# Patient Record
Sex: Female | Born: 1965 | Smoking: Current every day smoker
Health system: Southern US, Community
[De-identification: ages and names within clinical notes are randomized; demographics above are authoritative.]

## PROBLEM LIST (undated history)

## (undated) DIAGNOSIS — F329 Major depressive disorder, single episode, unspecified: Secondary | ICD-10-CM

## (undated) DIAGNOSIS — H548 Legal blindness, as defined in USA: Secondary | ICD-10-CM

## (undated) DIAGNOSIS — IMO0002 Reserved for concepts with insufficient information to code with codable children: Secondary | ICD-10-CM

## (undated) DIAGNOSIS — M329 Systemic lupus erythematosus, unspecified: Secondary | ICD-10-CM

## (undated) DIAGNOSIS — I10 Essential (primary) hypertension: Secondary | ICD-10-CM

## (undated) DIAGNOSIS — F419 Anxiety disorder, unspecified: Secondary | ICD-10-CM

## (undated) DIAGNOSIS — F32A Depression, unspecified: Secondary | ICD-10-CM

## (undated) HISTORY — DX: Depression, unspecified: F32.A

## (undated) HISTORY — DX: Major depressive disorder, single episode, unspecified: F32.9

## (undated) HISTORY — DX: Anxiety disorder, unspecified: F41.9

---

## 1994-08-30 HISTORY — PX: ABDOMINAL HYSTERECTOMY: SHX81

## 2014-08-30 HISTORY — PX: EYE SURGERY: SHX253

## 2018-02-06 ENCOUNTER — Emergency Department (HOSPITAL_COMMUNITY): Payer: Medicare Other

## 2018-02-06 ENCOUNTER — Encounter (HOSPITAL_COMMUNITY): Payer: Self-pay | Admitting: Emergency Medicine

## 2018-02-06 ENCOUNTER — Emergency Department (HOSPITAL_COMMUNITY)
Admission: EM | Admit: 2018-02-06 | Discharge: 2018-02-06 | Discharge status: Against Medical Advice | Payer: Medicare Other | Attending: Emergency Medicine | Admitting: Emergency Medicine

## 2018-02-06 DIAGNOSIS — R4701 Aphasia: Secondary | ICD-10-CM | POA: Insufficient documentation

## 2018-02-06 DIAGNOSIS — R51 Headache: Secondary | ICD-10-CM | POA: Insufficient documentation

## 2018-02-06 DIAGNOSIS — I10 Essential (primary) hypertension: Secondary | ICD-10-CM | POA: Insufficient documentation

## 2018-02-06 DIAGNOSIS — Z79899 Other long term (current) drug therapy: Secondary | ICD-10-CM | POA: Insufficient documentation

## 2018-02-06 DIAGNOSIS — R4789 Other speech disturbances: Secondary | ICD-10-CM

## 2018-02-06 DIAGNOSIS — R42 Dizziness and giddiness: Secondary | ICD-10-CM | POA: Insufficient documentation

## 2018-02-06 DIAGNOSIS — R509 Fever, unspecified: Secondary | ICD-10-CM | POA: Insufficient documentation

## 2018-02-06 DIAGNOSIS — R4781 Slurred speech: Secondary | ICD-10-CM | POA: Diagnosis present

## 2018-02-06 HISTORY — DX: Legal blindness, as defined in USA: H54.8

## 2018-02-06 HISTORY — DX: Essential (primary) hypertension: I10

## 2018-02-06 HISTORY — DX: Systemic lupus erythematosus, unspecified: M32.9

## 2018-02-06 HISTORY — DX: Reserved for concepts with insufficient information to code with codable children: IMO0002

## 2018-02-06 LAB — URINALYSIS, ROUTINE W REFLEX MICROSCOPIC
BILIRUBIN URINE: NEGATIVE
Glucose, UA: NEGATIVE mg/dL
HGB URINE DIPSTICK: NEGATIVE
KETONES UR: NEGATIVE mg/dL
Leukocytes, UA: NEGATIVE
Nitrite: NEGATIVE
PROTEIN: NEGATIVE mg/dL
Specific Gravity, Urine: 1.009 (ref 1.005–1.030)
pH: 6 (ref 5.0–8.0)

## 2018-02-06 LAB — CBC WITH DIFFERENTIAL/PLATELET
BASOS ABS: 0.1 10*3/uL (ref 0.0–0.1)
Basophils Relative: 1 %
Eosinophils Absolute: 0.1 10*3/uL (ref 0.0–0.7)
Eosinophils Relative: 2 %
HCT: 40.5 % (ref 36.0–46.0)
Hemoglobin: 12.2 g/dL (ref 12.0–15.0)
LYMPHS ABS: 0.6 10*3/uL — AB (ref 0.7–4.0)
LYMPHS PCT: 10 %
MCH: 21.4 pg — ABNORMAL LOW (ref 26.0–34.0)
MCHC: 30.1 g/dL (ref 30.0–36.0)
MCV: 71.2 fL — ABNORMAL LOW (ref 78.0–100.0)
MONOS PCT: 8 %
Monocytes Absolute: 0.5 10*3/uL (ref 0.1–1.0)
Neutro Abs: 4.4 10*3/uL (ref 1.7–7.7)
Neutrophils Relative %: 79 %
Platelets: 239 10*3/uL (ref 150–400)
RBC: 5.69 MIL/uL — AB (ref 3.87–5.11)
RDW: 22.8 % — ABNORMAL HIGH (ref 11.5–15.5)
WBC: 5.7 10*3/uL (ref 4.0–10.5)

## 2018-02-06 LAB — COMPREHENSIVE METABOLIC PANEL
ALT: 14 U/L (ref 14–54)
ANION GAP: 9 (ref 5–15)
AST: 21 U/L (ref 15–41)
Albumin: 3.8 g/dL (ref 3.5–5.0)
Alkaline Phosphatase: 97 U/L (ref 38–126)
BUN: 5 mg/dL — ABNORMAL LOW (ref 6–20)
CO2: 24 mmol/L (ref 22–32)
CREATININE: 0.8 mg/dL (ref 0.44–1.00)
Calcium: 9.3 mg/dL (ref 8.9–10.3)
Chloride: 103 mmol/L (ref 101–111)
Glucose, Bld: 101 mg/dL — ABNORMAL HIGH (ref 65–99)
POTASSIUM: 4 mmol/L (ref 3.5–5.1)
Sodium: 136 mmol/L (ref 135–145)
Total Bilirubin: 0.5 mg/dL (ref 0.3–1.2)
Total Protein: 8.1 g/dL (ref 6.5–8.1)

## 2018-02-06 LAB — TSH: TSH: 0.26 u[IU]/mL — AB (ref 0.350–4.500)

## 2018-02-06 LAB — I-STAT CG4 LACTIC ACID, ED: LACTIC ACID, VENOUS: 1.33 mmol/L (ref 0.5–1.9)

## 2018-02-06 LAB — T4, FREE: Free T4: 1.15 ng/dL (ref 0.82–1.77)

## 2018-02-06 LAB — PROTIME-INR
INR: 1.05
Prothrombin Time: 13.6 seconds (ref 11.4–15.2)

## 2018-02-06 IMAGING — CT CT HEAD W/O CM
4 series · 17 of 47 positions shown, 19 images · non-contrast
Comparison: None.

CLINICAL DATA: Slurred speech and dizziness. Fever. Fall this
morning.

EXAM:
CT HEAD WITHOUT CONTRAST
TECHNIQUE: Contiguous axial images were obtained from the base of the skull
through the vertex without intravenous contrast.

[Series 3: head wo · axial · 0.43mm/px · z∈[-204,-84]mm · 7 of 34 slices shown, 9 images]
[im 5/34  brain]
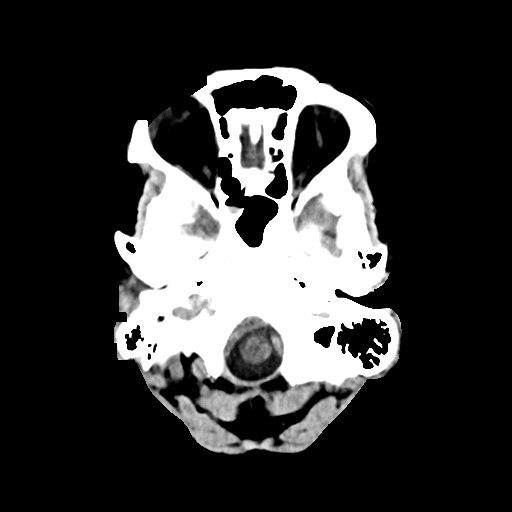
[im 5/34  bone]
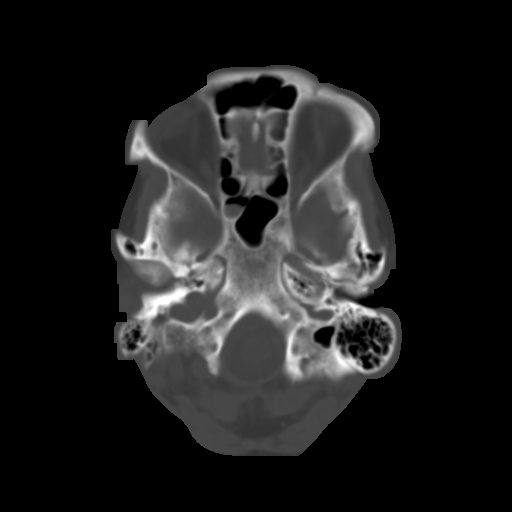
[im 9/34  brain]
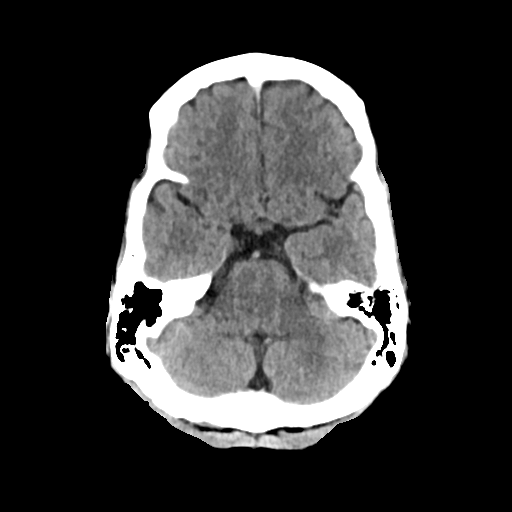
[im 13/34  brain]
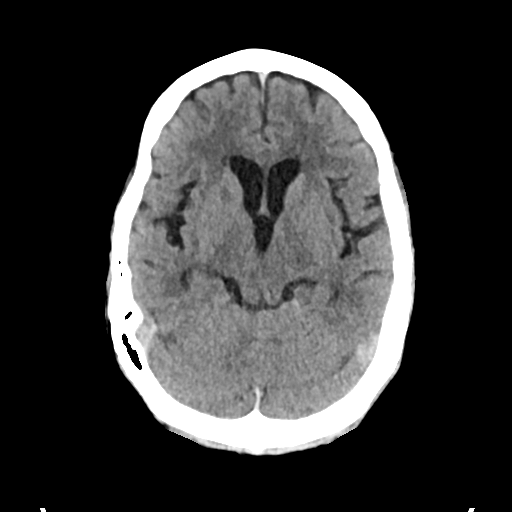
[im 17/34  brain]
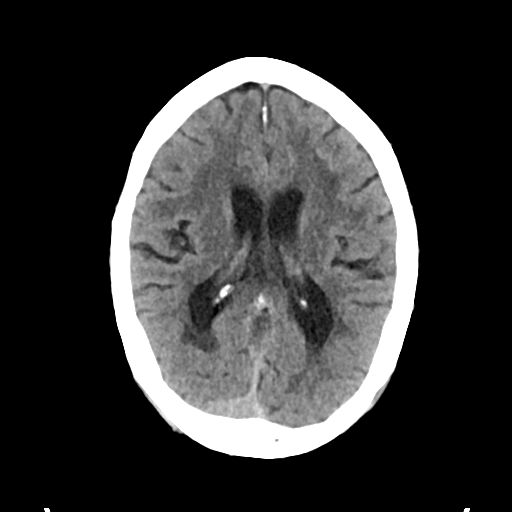
[im 21/34  brain]
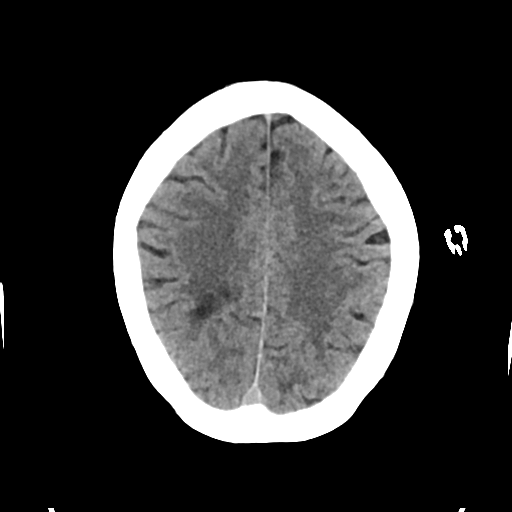
[im 21/34  bone]
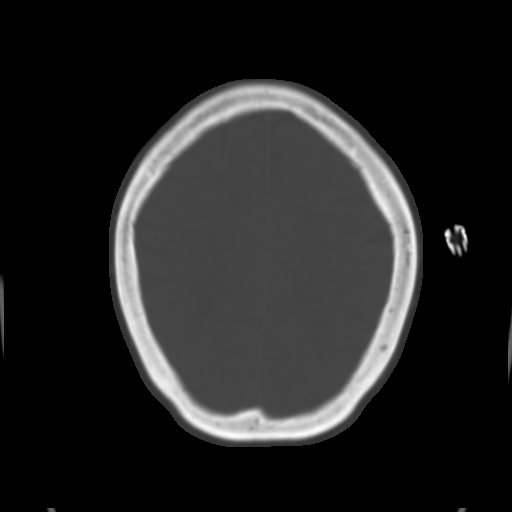
[im 25/34  brain]
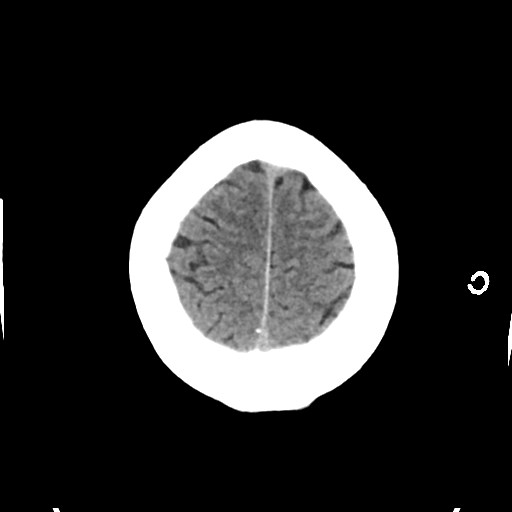
[im 29/34  brain]
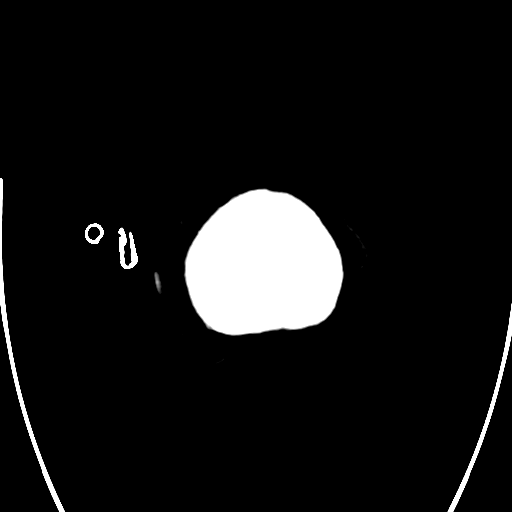

[Series 4: head bone · axial · 0.43mm/px · z∈[-208,-150]mm · 4 of 85 slices shown]
[im 9/85  bone]
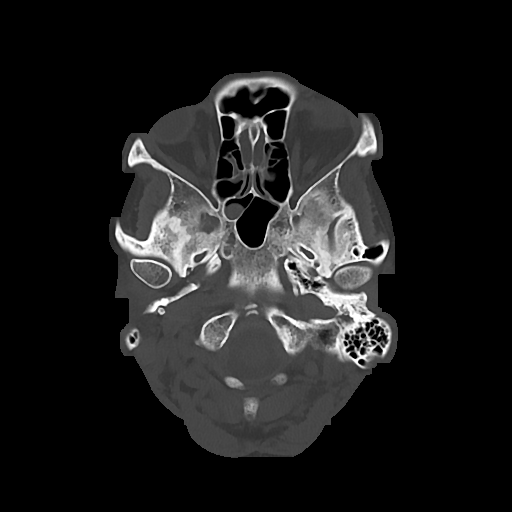
[im 17/85  bone]
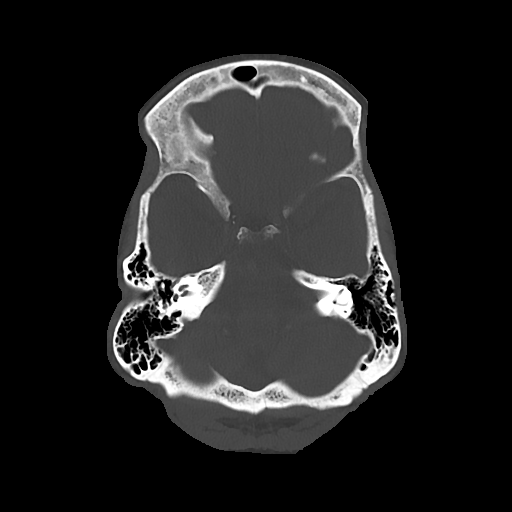
[im 26/85  bone]
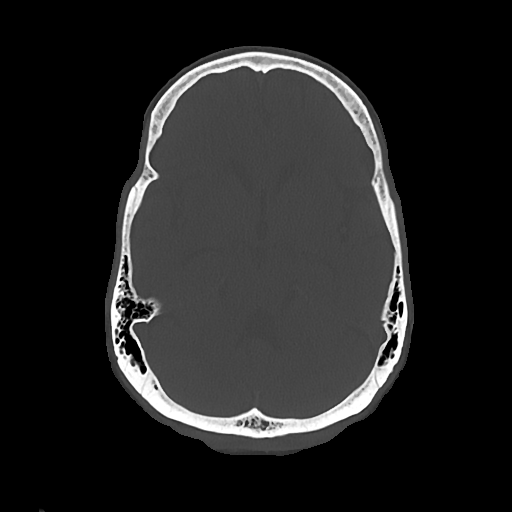
[im 38/85  bone]
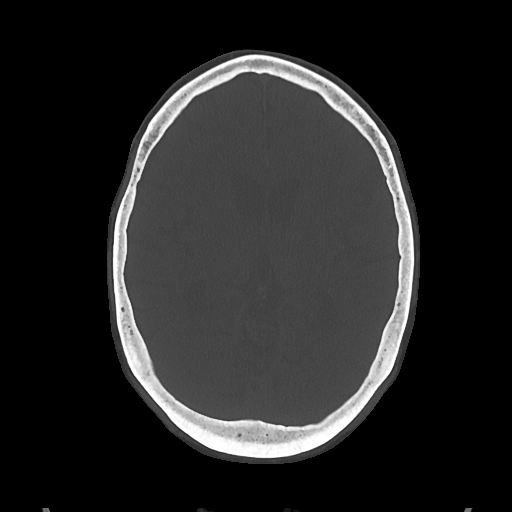

[Series 5: cor soft · coronal · 0.33mm/px · 3 of 71 slices shown]
[im 24/71  brain]
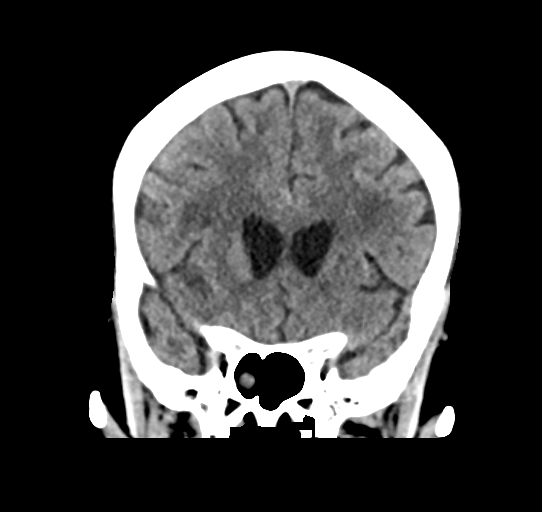
[im 32/71  brain]
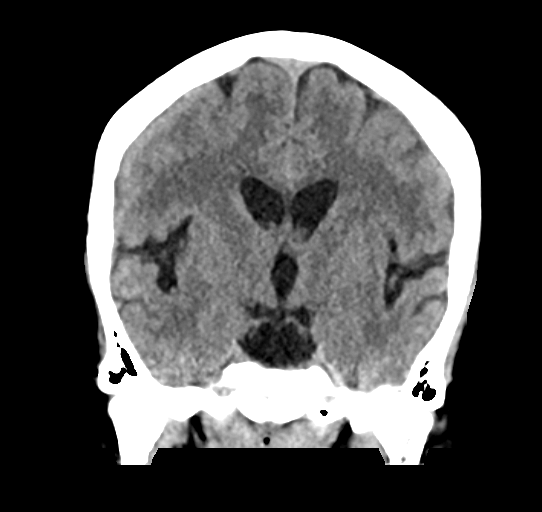
[im 39/71  brain]
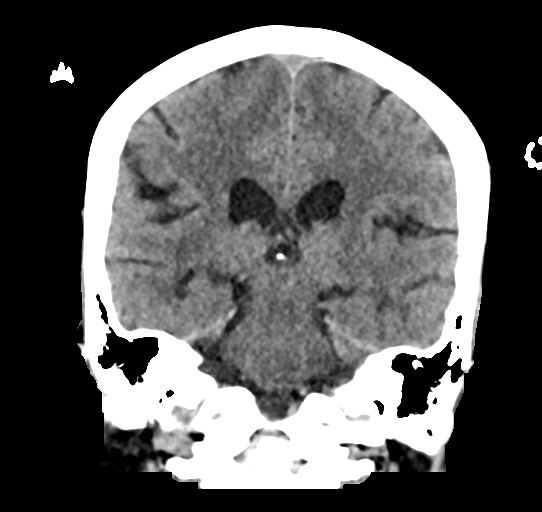

[Series 6: sag soft · sagittal · 0.37mm/px · 3 of 64 slices shown]
[im 22/64  brain]
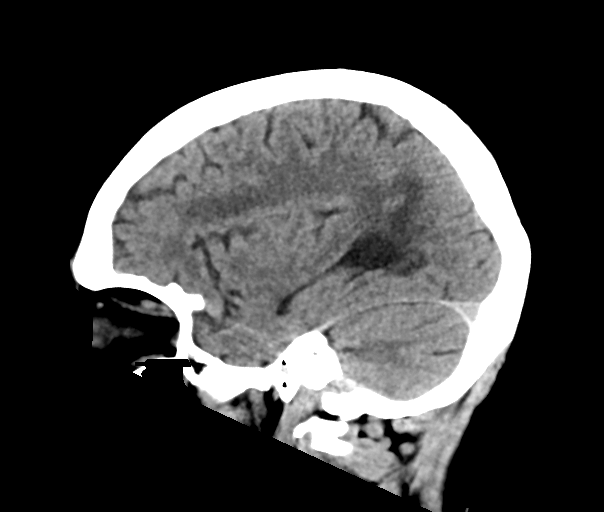
[im 32/64  brain]
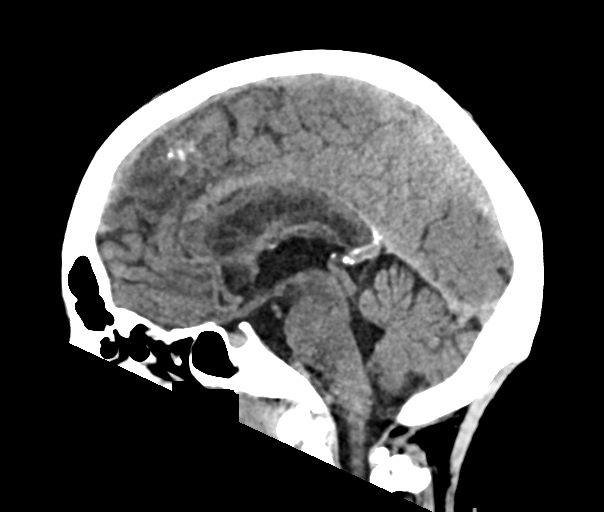
[im 43/64  brain]
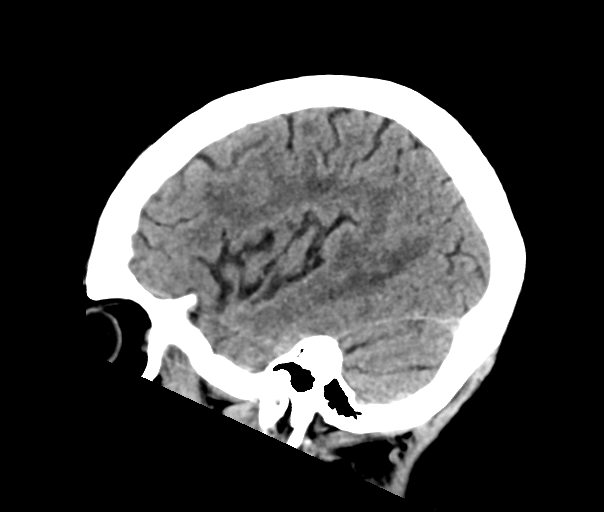

[17 of 47 positions shown; findings below may reference images not displayed]

FINDINGS: Brain: No subdural, epidural, or subarachnoid hemorrhage.
Cerebellum, brainstem, and basal cisterns are normal. Ventricles and
sulci are unremarkable. White matter changes are identified. No
acute cortical ischemia or infarct. No mass effect or midline shift.
No acute cortical ischemia infarct noted.

Vascular: Mild atherosclerotic changes in the intracranial carotids.

Skull: Normal. Negative for fracture or focal lesion.

Sinuses/Orbits: Mucosal thickening in the ethmoid air cells.

Other: None.
IMPRESSION: White matter changes. No acute cortical ischemia or infarct. No
bleed identified.

## 2018-02-06 IMAGING — CR DG CHEST 2V
2 series · 2 of 2 positions shown · non-contrast
Comparison: None

CLINICAL DATA: Fever

EXAM:
CHEST - 2 VIEW

[chest lat]
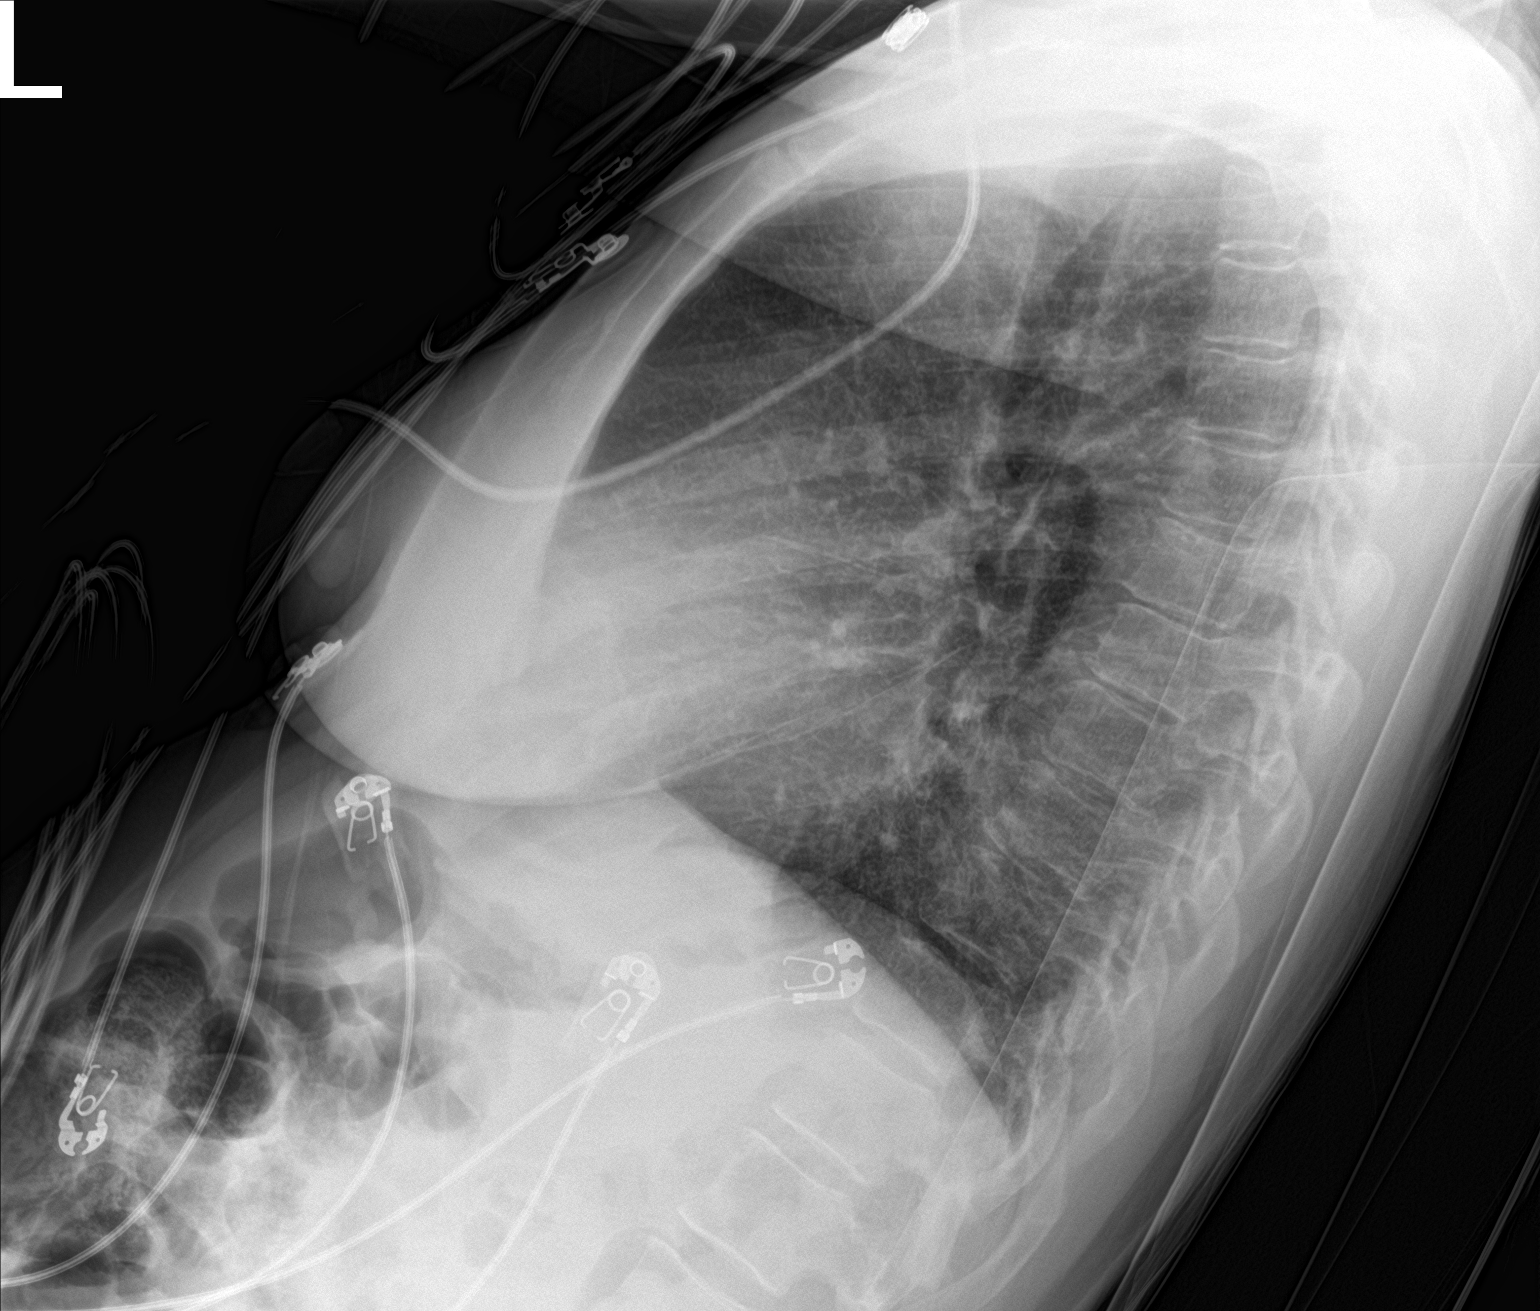

[chest ap]
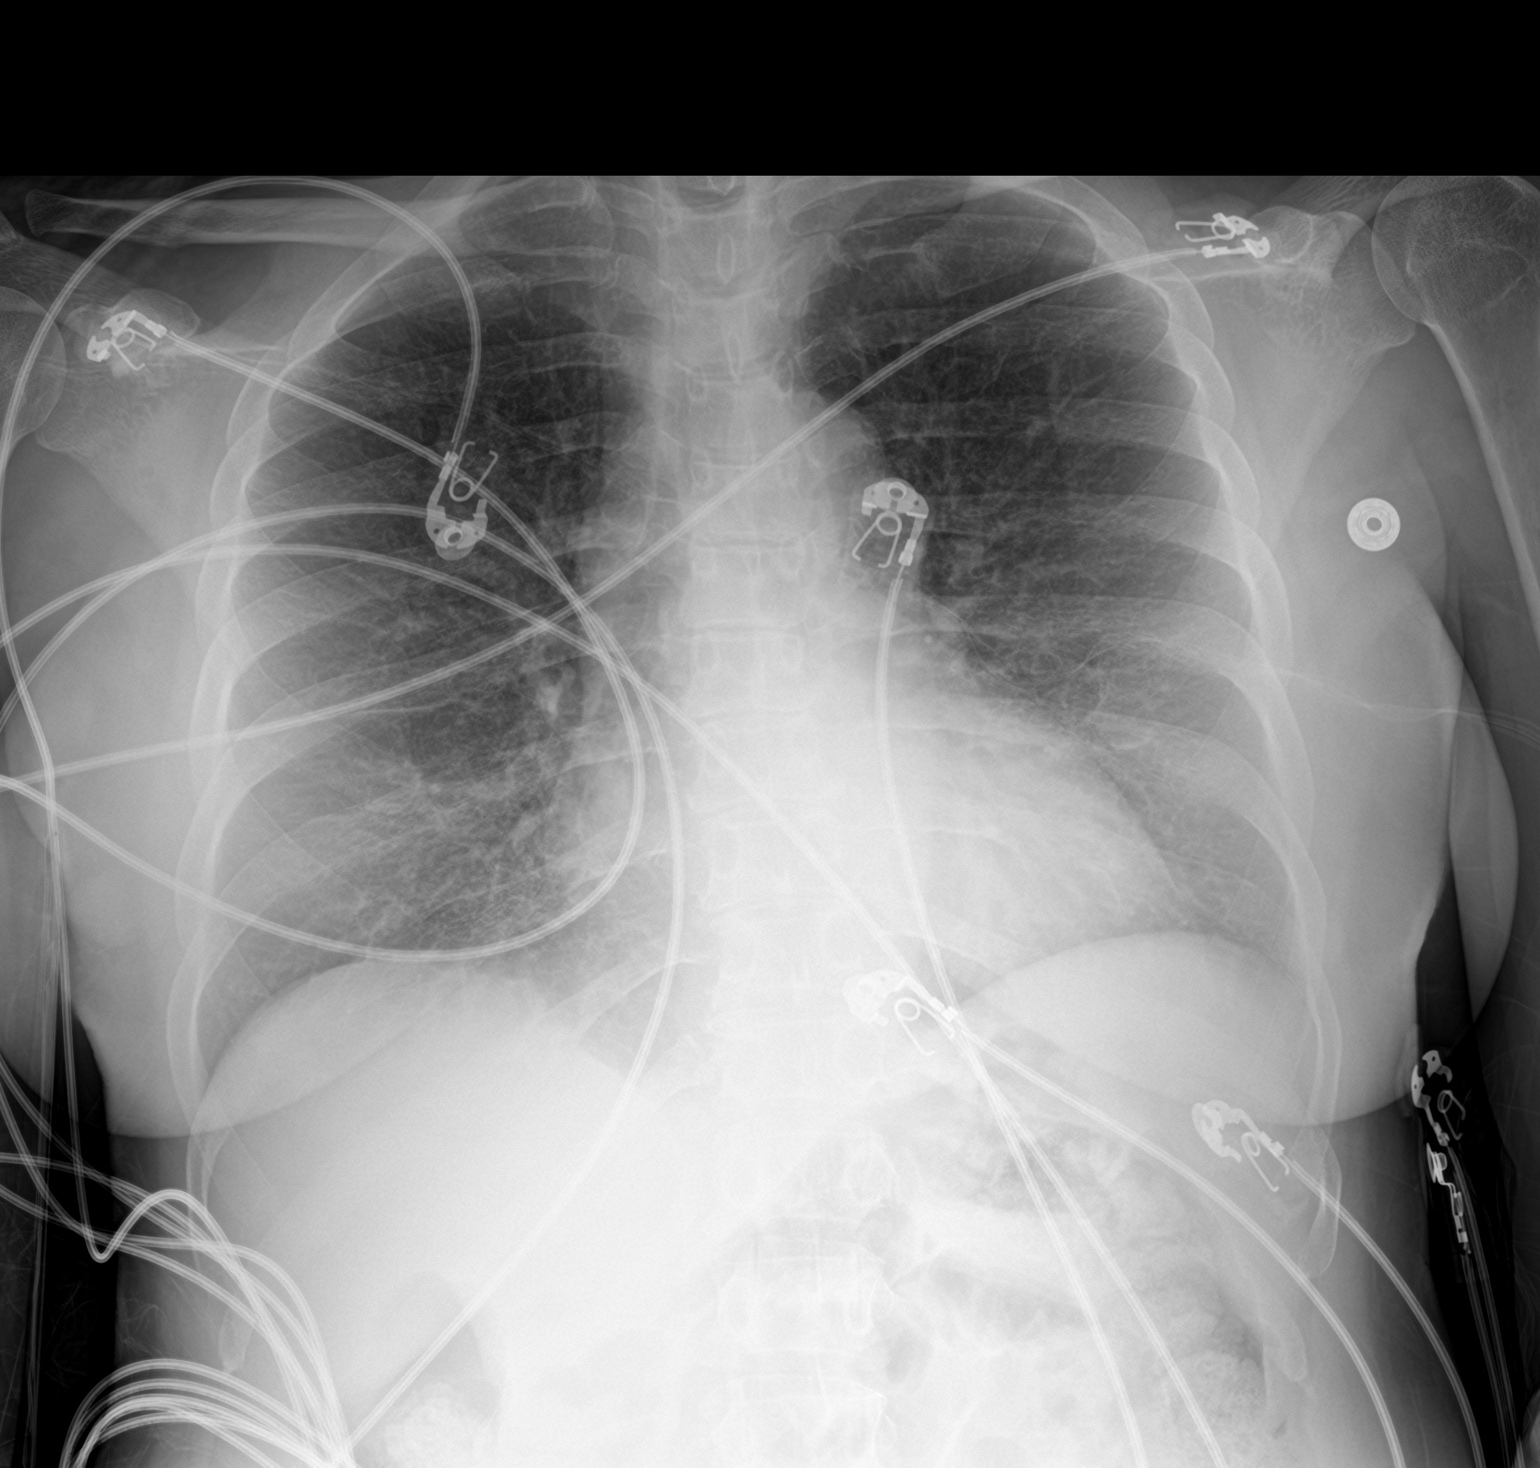

[2 of 2 positions shown; findings below may reference images not displayed]

FINDINGS: No pneumonia or pleural effusion is seen. Mediastinal and hilar
contours are unremarkable. The heart is within upper limits of
normal. No acute bony abnormality is seen.
IMPRESSION: No active cardiopulmonary disease.

## 2018-02-06 MED ORDER — SODIUM CHLORIDE 0.9 % IV BOLUS
1000.0000 mL | Freq: Once | INTRAVENOUS | Status: AC
  Administered 2018-02-06: 1000 mL via INTRAVENOUS

## 2018-02-06 MED ORDER — ACETAMINOPHEN 325 MG PO TABS
650.0000 mg | ORAL_TABLET | Freq: Once | ORAL | Status: AC
  Administered 2018-02-06: 650 mg via ORAL
  Filled 2018-02-06: qty 2

## 2018-02-06 MED ORDER — LEVOFLOXACIN 750 MG PO TABS: 750.0000 mg | ORAL_TABLET | Freq: Every day | ORAL | 0 refills | Status: DC

## 2018-02-06 NOTE — ED Triage Notes (Signed)
PT went to bed at 0400 this morning. PT was normal at that time. PT woke up at 0700 with slurred speech and dizziness. LVO negative per EMS. PT has not personal history of stroke. PT is febrile in triage 102.6. PT reports she fell 2x this AM. PT has been on antibiotics recently for sinus issues. PT still taking these.   PT reports fatigue as well.

## 2018-02-06 NOTE — ED Provider Notes (Signed)
[facility] EMERGENCY DEPARTMENT Provider Note   CSN: [id number] Arrival date & time: 02/06/18  1416     History   Chief Complaint Chief Complaint  Patient presents with  . Aphasia  . Fever    HPI Dana Turner is a 52 y.o. female.  HPI Patient states she is recently started on antibiotics for cold.  Continues to have cough.  States states that she went to bed around 4 AM.  She woke it 7 AM with some slurring of her speech and dizziness.  Describes the dizziness as lightheaded.  Thinks that she fell off the toilet.  Denied nausea or vomiting.  States she does have a frontal headache.  She is legally blind in her left eye with no changes in the vision of her right eye.  No neck pain or stiffness.  She denies chest pain or abdominal pain.  Denies any focal weakness.  No recent medication changes other than the antibiotic. Past Medical History:  Diagnosis Date  . Hypertension   . Legally blind in left eye, as defined in [place]   . Lupus (HCC)     There are no active problems to display for this patient.   Past Surgical History:  Procedure Laterality Date  . ABDOMINAL HYSTERECTOMY    . EYE SURGERY       OB History   None      Home Medications    Prior to Admission medications   Medication Sig Start Date End Date Taking? Authorizing Provider  ALPRAZolam Prudy Feeler) 1 MG tablet Take 1 mg by mouth 3 (three) times daily as needed for anxiety.   Yes [provider]  amLODipine (NORVASC) 10 MG tablet Take 10 mg by mouth daily.   Yes [provider]  hydroxychloroquine (PLAQUENIL) 200 MG tablet Take 200 mg by mouth 2 (two) times daily.   Yes [provider]  levofloxacin (LEVAQUIN) 750 MG tablet Take 1 tablet (750 mg total) by mouth daily. X 7 days 02/06/18   Loren Racer, MD    Family History No family history on file.  Social History Social History   Tobacco Use  . Smoking status: Not on file  Substance Use Topics  .  Alcohol use: Not on file  . Drug use: Not on file     Allergies   Patient has no known allergies.   Review of Systems Review of Systems  Constitutional: Positive for chills and fever.  HENT: Negative for sore throat.   Eyes: Negative for visual disturbance.  Respiratory: Positive for cough. Negative for shortness of breath and wheezing.   Cardiovascular: Negative for chest pain, palpitations and leg swelling.  Gastrointestinal: Negative for abdominal pain, constipation, diarrhea, nausea and vomiting.  Genitourinary: Negative for difficulty urinating, flank pain and frequency.  Musculoskeletal: Negative for back pain, gait problem, myalgias and neck pain.  Skin: Negative for rash and wound.  Neurological: Positive for dizziness, speech difficulty, light-headedness and headaches. Negative for syncope, weakness and numbness.  All other systems reviewed and are negative.    Physical Exam Updated Vital Signs BP (!) 167/115   Pulse 93   Temp (!) 101.2 F (38.4 C) (Oral)   Resp (!) 23   Wt 58.5 kg (129 lb)   SpO2 100%   Physical Exam  Constitutional: She is oriented to person, place, and time. She appears well-developed and well-nourished. No distress.  HENT:  Head: Normocephalic and atraumatic.  Mouth/Throat: Oropharynx is clear and moist. No oropharyngeal exudate.  No sinus tenderness to percussion.  Eyes: Pupils are equal, round, and reactive to light. EOM are normal.  Disconjugate eye movement.  Neck: Normal range of motion. Neck supple. No thyromegaly present.  No meningismus.  Cardiovascular: Regular rhythm. Exam reveals no gallop and no friction rub.  No murmur heard. Tachycardia  Pulmonary/Chest: Effort normal and breath sounds normal. No respiratory distress. She has no wheezes. She has no rales.  Abdominal: Soft. Bowel sounds are normal. She exhibits no distension and no mass. There is no tenderness. There is no rebound and no guarding. No hernia.    Musculoskeletal: Normal range of motion. She exhibits no edema or tenderness.  No midline thoracic lumbar tenderness.  No CVA tenderness.  No lower extremity swelling, asymmetry or tenderness.  Distal pulses are 2+.  Lymphadenopathy:    She has no cervical adenopathy.  Neurological: She is alert and oriented to person, place, and time.  Moves all extremities without focal deficit.  Sensation intact.  No facial asymmetry.  Question mild ataxia with right finger-nose testing though patient is legally blind in the left eye.  Speech is measured and at times slurred  Skin: Skin is warm and dry. Capillary refill takes less than 2 seconds. No rash noted. She is not diaphoretic. No erythema.  Psychiatric: She has a normal mood and affect. Her behavior is normal.  Nursing note and vitals reviewed.    ED Treatments / Results  Labs (all labs ordered are listed, but only abnormal results are displayed) Labs Reviewed  COMPREHENSIVE METABOLIC PANEL - Abnormal; Notable for the following components:      Result Value   Glucose, Bld 101 (*)    BUN 5 (*)    All other components within normal limits  CBC WITH DIFFERENTIAL/PLATELET - Abnormal; Notable for the following components:   RBC 5.69 (*)    MCV 71.2 (*)    MCH 21.4 (*)    RDW 22.8 (*)    Lymphs Abs 0.6 (*)    All other components within normal limits  TSH - Abnormal; Notable for the following components:   TSH 0.260 (*)    All other components within normal limits  CULTURE, BLOOD (ROUTINE X 2)  CULTURE, BLOOD (ROUTINE X 2)  PROTIME-INR  URINALYSIS, ROUTINE W REFLEX MICROSCOPIC  T4, FREE  INFLUENZA PANEL BY PCR (TYPE A & B)  INFLUENZA PANEL BY PCR (TYPE A & B)  I-STAT CG4 LACTIC ACID, ED    EKG None  Radiology Dg Chest 2 View  Result Date: 02/06/2018 CLINICAL DATA:  Fever EXAM: CHEST - 2 VIEW COMPARISON:  None FINDINGS: No pneumonia or pleural effusion is seen. Mediastinal and hilar contours are unremarkable. The heart is within  upper limits of normal. No acute bony abnormality is seen. IMPRESSION: No active cardiopulmonary disease. Electronically Signed   By: Dwyane Dee M.D.   On: 02/06/2018 15:31   Ct Head Wo Contrast  Result Date: 02/06/2018 CLINICAL DATA:  Slurred speech and dizziness. Fever. Fall this morning. EXAM: CT HEAD WITHOUT CONTRAST TECHNIQUE: Contiguous axial images were obtained from the base of the skull through the vertex without intravenous contrast. COMPARISON:  None. FINDINGS: Brain: No subdural, epidural, or subarachnoid hemorrhage. Cerebellum, brainstem, and basal cisterns are normal. Ventricles and sulci are unremarkable. White matter changes are identified. No acute cortical ischemia or infarct. No mass effect or midline shift. No acute cortical ischemia infarct noted. Vascular: Mild atherosclerotic changes in the intracranial carotids. Skull: Normal. Negative for fracture or focal  lesion. Sinuses/Orbits: Mucosal thickening in the ethmoid air cells. Other: None. IMPRESSION: White matter changes. No acute cortical ischemia or infarct. No bleed identified. Electronically Signed   By: Gerome Sam III M.D   On: 02/06/2018 18:35    Procedures Procedures (including critical care time)  Medications Ordered in ED Medications  acetaminophen (TYLENOL) tablet 650 mg (650 mg Oral Given 02/06/18 1541)  sodium chloride 0.9 % bolus 1,000 mL (0 mLs Intravenous Stopped 02/06/18 2046)     Initial Impression / Assessment and Plan / ED Course  I have reviewed the triage vital signs and the nursing notes.  Pertinent labs & imaging results that were available during my care of the patient were reviewed by me and considered in my medical decision making (see chart for details).     Patient states that her speech is at her baseline.  CT head without acute findings.  Normal white blood cell count.  No definite source for patient's fever.  Given history of speech changes, have recommended MRI brain.  Patient stating  that she is wanting to leave and not get the MRI.  She understands the risks associated with leaving prior to complete work-up and will sign out AMA.  Given patient's symptoms are mostly upper respiratory, will switch antibiotics to broader spectrum.  She understands the need to return immediately for any worsening of her symptoms, new neurologic symptoms or for any concerns.  Final Clinical Impressions(s) / ED Diagnoses   Final diagnoses:  Febrile illness  Spell of change in speech    ED Discharge Orders        Ordered    levofloxacin (LEVAQUIN) 750 MG tablet  Daily     02/06/18 2206       Loren Racer, MD 02/06/18 2206

## 2018-02-06 NOTE — ED Notes (Signed)
Patient transported to CT 

## 2018-02-06 NOTE — ED Notes (Signed)
Patient transported to X-ray 

## 2018-02-06 NOTE — ED Notes (Signed)
Pt requesting to leave AMA. MD [place] Notified.

## 2018-02-07 LAB — INFLUENZA PANEL BY PCR (TYPE A & B)
INFLAPCR: NEGATIVE
INFLBPCR: NEGATIVE

## 2018-02-11 LAB — CULTURE, BLOOD (ROUTINE X 2)
CULTURE: NO GROWTH
Culture: NO GROWTH
SPECIAL REQUESTS: ADEQUATE

## 2018-07-03 ENCOUNTER — Ambulatory Visit: Payer: Self-pay | Admitting: Emergency Medicine

## 2018-08-01 ENCOUNTER — Ambulatory Visit: Payer: Self-pay | Admitting: Emergency Medicine

## 2018-09-14 ENCOUNTER — Other Ambulatory Visit: Payer: Self-pay

## 2018-09-14 ENCOUNTER — Ambulatory Visit (INDEPENDENT_AMBULATORY_CARE_PROVIDER_SITE_OTHER): Payer: Medicare HMO | Admitting: Emergency Medicine

## 2018-09-14 ENCOUNTER — Encounter: Payer: Self-pay | Admitting: Emergency Medicine

## 2018-09-14 VITALS — BP 182/99 | HR 65 | Temp 98.1°F | Resp 16 | Ht 61.25 in | Wt 124.2 lb

## 2018-09-14 DIAGNOSIS — G8929 Other chronic pain: Secondary | ICD-10-CM

## 2018-09-14 DIAGNOSIS — I10 Essential (primary) hypertension: Secondary | ICD-10-CM | POA: Diagnosis not present

## 2018-09-14 DIAGNOSIS — M25561 Pain in right knee: Secondary | ICD-10-CM | POA: Diagnosis not present

## 2018-09-14 DIAGNOSIS — F419 Anxiety disorder, unspecified: Secondary | ICD-10-CM

## 2018-09-14 DIAGNOSIS — M329 Systemic lupus erythematosus, unspecified: Secondary | ICD-10-CM

## 2018-09-14 DIAGNOSIS — Z87828 Personal history of other (healed) physical injury and trauma: Secondary | ICD-10-CM

## 2018-09-14 DIAGNOSIS — IMO0002 Reserved for concepts with insufficient information to code with codable children: Secondary | ICD-10-CM

## 2018-09-14 MED ORDER — AMLODIPINE BESYLATE 10 MG PO TABS: 10.0000 mg | ORAL_TABLET | Freq: Every day | ORAL | 3 refills | Status: DC

## 2018-09-14 NOTE — Progress Notes (Addendum)
Dana Turner 53 y.o. BP Readings from Last 3 Encounters:  09/14/18 (!) 182/99  02/06/18 (!) 167/115     Chief Complaint  Patient presents with  . Establish Care    per patient referrals to-Rheumatology for LUPUS and Orthopedic  . Hypertension    per patient have concerns     HISTORY OF PRESENT ILLNESS: This is a 53 y.o. female here to establish care.  Recently moved from [place].  Has the following medical problems: 1.  SLE diagnosed at age 30.  On Plaquenil 2 times a day.  Requesting rheumatology referral. 2.  Hypertension.  On amlodipine 10 mg a day.  Off medication for several weeks.  Asymptomatic. 3.  Chronic anxiety.  Takes Xanax 1 mg 3 times a day.  Has tried BuSpar in the past. 4.  Status post right knee surgery secondary to ACL tear.  Requesting orthopedic referral.  HPI   Prior to Admission medications   Medication Sig Start Date End Date Taking? Authorizing Provider  ALPRAZolam Prudy Feeler) 1 MG tablet Take 1 mg by mouth 3 (three) times daily as needed for anxiety.   Yes [provider]  amLODipine (NORVASC) 10 MG tablet Take 10 mg by mouth daily.   Yes [provider]  hydroxychloroquine (PLAQUENIL) 200 MG tablet Take 200 mg by mouth 2 (two) times daily.   Yes [provider]  levofloxacin (LEVAQUIN) 750 MG tablet Take 1 tablet (750 mg total) by mouth daily. X 7 days Patient not taking: Reported on 09/14/2018 02/06/18   Loren Racer, MD    No Known Allergies  There are no active problems to display for this patient.   Past Medical History:  Diagnosis Date  . Anxiety   . Depression   . Hypertension   . Legally blind in left eye, as defined in [place]   . Lupus [facility])     Past Surgical History:  Procedure Laterality Date  . ABDOMINAL HYSTERECTOMY  1996  . EYE SURGERY Left 2016    Social History   Socioeconomic History  . Marital status: Unknown    Spouse name: Not on file  . Number of children: Not on file  . Years of  education: Not on file  . Highest education level: Not on file  Occupational History  . Not on file  Social Needs  . Financial resource strain: Not on file  . Food insecurity:    Worry: Not on file    Inability: Not on file  . Transportation needs:    Medical: Not on file    Non-medical: Not on file  Tobacco Use  . Smoking status: Current Every Day Smoker    Packs/day: 0.50    Years: 10.00    Pack years: 5.00    Types: Cigarettes  . Smokeless tobacco: Never Used  Substance and Sexual Activity  . Alcohol use: Yes    Alcohol/week: 1.0 standard drinks    Types: 1 Glasses of wine per week  . Drug use: Never  . Sexual activity: Not on file  Lifestyle  . Physical activity:    Days per week: Not on file    Minutes per session: Not on file  . Stress: Not on file  Relationships  . Social connections:    Talks on phone: Not on file    Gets together: Not on file    Attends religious service: Not on file    Active member of club or organization: Not on file    Attends  meetings of clubs or organizations: Not on file    Relationship status: Not on file  . Intimate partner violence:    Fear of current or ex partner: Not on file    Emotionally abused: Not on file    Physically abused: Not on file    Forced sexual activity: Not on file  Other Topics Concern  . Not on file  Social History Narrative  . Not on file    No family history on file.   Review of Systems  Constitutional: Negative.  Negative for chills and fever.  HENT: Negative.   Eyes: Negative.        Blind from left eye  Respiratory: Negative.  Negative for cough and shortness of breath.   Cardiovascular: Negative.  Negative for chest pain and palpitations.  Gastrointestinal: Negative.  Negative for abdominal pain, blood in stool, diarrhea, melena, nausea and vomiting.  Genitourinary: Negative.  Negative for dysuria.  Musculoskeletal: Positive for joint pain and myalgias.  Skin: Negative.  Negative for rash.    Neurological: Negative.  Negative for dizziness and headaches.  Endo/Heme/Allergies: Negative.   All other systems reviewed and are negative.   Vitals:   09/14/18 1434  BP: (!) 182/99  Pulse: 65  Resp: 16  Temp: 98.1 F (36.7 C)  SpO2: 100%    Physical Exam Vitals signs reviewed.  Constitutional:      Appearance: Normal appearance.  HENT:     Head: Normocephalic and atraumatic.     Nose: Nose normal.     Mouth/Throat:     Mouth: Mucous membranes are moist.     Pharynx: Oropharynx is clear.  Eyes:     Conjunctiva/sclera: Conjunctivae normal.     Comments: Normal right eye  Neck:     Musculoskeletal: Normal range of motion and neck supple.  Cardiovascular:     Rate and Rhythm: Normal rate and regular rhythm.     Heart sounds: Murmur present. Systolic murmur present with a grade of 3/6.  Pulmonary:     Breath sounds: Normal breath sounds.  Abdominal:     General: There is no distension.     Palpations: Abdomen is soft.     Tenderness: There is no abdominal tenderness.  Musculoskeletal: Normal range of motion.  Skin:    General: Skin is warm and dry.  Neurological:     General: No focal deficit present.     Mental Status: She is oriented to person, place, and time.  Psychiatric:        Mood and Affect: Mood normal.        Behavior: Behavior normal.      ASSESSMENT & PLAN: Dorsa was seen today for establish care and hypertension.  Diagnoses and all orders for this visit:  Essential hypertension -     amLODipine (NORVASC) 10 MG tablet; Take 1 tablet (10 mg total) by mouth daily.  History of lupus (HCC) -     Ambulatory referral to Rheumatology  Chronic pain of right knee -     Ambulatory referral to Orthopedic Surgery  History of tear of ACL (anterior cruciate ligament) -     Ambulatory referral to Orthopedic Surgery  Chronic anxiety    Patient Instructions       If you have lab work done today you will be contacted with your lab results  within the next 2 weeks.  If you have not heard from [place] then please contact us. The fastest way to get your results is  to register for My Chart.   IF you received an x-ray today, you will receive an invoice from [facility]. Please contact [facility] at [phone] with questions or concerns regarding your invoice.   IF you received labwork today, you will receive an invoice from [facility]. Please contact [facility] at [phone] with questions or concerns regarding your invoice.   Our billing staff will not be able to assist you with questions regarding bills from these companies.  You will be contacted with the lab results as soon as Hypertension Hypertension, commonly called high blood pressure, is when the force of blood pumping through the arteries is too strong. The arteries are the blood vessels that carry blood from the heart throughout the body. Hypertension forces the heart to work harder to pump blood and may cause arteries to become narrow or stiff. Having untreated or uncontrolled hypertension can cause heart attacks, strokes, kidney disease, and other problems. A blood pressure reading consists of a higher number over a lower number. Ideally, your blood pressure should be below 120/80. The first ("top") number is called the systolic pressure. It is a measure of the pressure in your arteries as your heart beats. The second ("bottom") number is called the diastolic pressure. It is a measure of the pressure in your arteries as the heart relaxes. What are the causes? The cause of this condition is not known. What increases the risk? Some risk factors for high blood pressure are under your control. Others are not. Factors you can change  Smoking.  Having type 2 diabetes mellitus, high cholesterol, or both.  Not getting enough exercise or physical activity.  Being overweight.  Having too much fat, sugar, calories, or salt (sodium) in your diet.  Drinking too  much alcohol. Factors that are difficult or impossible to change  Having chronic kidney disease.  Having a family history of high blood pressure.  Age. Risk increases with age.  Race. You may be at higher risk if you are African-American.  Gender. Men are at higher risk than women before age 52. After age 52, women are at higher risk than men.  Having obstructive sleep apnea.  Stress. What are the signs or symptoms? Extremely high blood pressure (hypertensive crisis) may cause:  Headache.  Anxiety.  Shortness of breath.  Nosebleed.  Nausea and vomiting.  Severe chest pain.  Jerky movements you cannot control (seizures). How is this diagnosed? This condition is diagnosed by measuring your blood pressure while you are seated, with your arm resting on a surface. The cuff of the blood pressure monitor will be placed directly against the skin of your upper arm at the level of your heart. It should be measured at least twice using the same arm. Certain conditions can cause a difference in blood pressure between your right and left arms. Certain factors can cause blood pressure readings to be lower or higher than normal (elevated) for a short period of time:  When your blood pressure is higher when you are in a health care provider's office than when you are at home, this is called white coat hypertension. Most people with this condition do not need medicines.  When your blood pressure is higher at home than when you are in a health care provider's office, this is called masked hypertension. Most people with this condition may need medicines to control blood pressure. If you have a high blood pressure reading during one visit or you have normal blood pressure with other risk factors:  You may be asked to return on a different day to have your blood pressure checked again.  You may be asked to monitor your blood pressure at home for 1 week or longer. If you are diagnosed with  hypertension, you may have other blood or imaging tests to help your health care provider understand your overall risk for other conditions. How is this treated? This condition is treated by making healthy lifestyle changes, such as eating healthy foods, exercising more, and reducing your alcohol intake. Your health care provider may prescribe medicine if lifestyle changes are not enough to get your blood pressure under control, and if:  Your systolic blood pressure is above 130.  Your diastolic blood pressure is above 80. Your personal target blood pressure may vary depending on your medical conditions, your age, and other factors. Follow these instructions at home: Eating and drinking   Eat a diet that is high in fiber and potassium, and low in sodium, added sugar, and fat. An example eating plan is called the DASH (Dietary Approaches to Stop Hypertension) diet. To eat this way: ? Eat plenty of fresh fruits and vegetables. Try to fill half of your plate at each meal with fruits and vegetables. ? Eat whole grains, such as whole wheat pasta, brown rice, or whole grain bread. Fill about one quarter of your plate with whole grains. ? Eat or drink low-fat dairy products, such as skim milk or low-fat yogurt. ? Avoid fatty cuts of meat, processed or cured meats, and poultry with skin. Fill about one quarter of your plate with lean proteins, such as fish, chicken without skin, beans, eggs, and tofu. ? Avoid premade and processed foods. These tend to be higher in sodium, added sugar, and fat.  Reduce your daily sodium intake. Most people with hypertension should eat less than 1,500 mg of sodium a day.  Limit alcohol intake to no more than 1 drink a day for nonpregnant women and 2 drinks a day for men. One drink equals 12 oz of beer, 5 oz of wine, or 1 oz of hard liquor. Lifestyle   Work with your health care provider to maintain a healthy body weight or to lose weight. Ask what an ideal weight is  for you.  Get at least 30 minutes of exercise that causes your heart to beat faster (aerobic exercise) most days of the week. Activities may include walking, swimming, or biking.  Include exercise to strengthen your muscles (resistance exercise), such as pilates or lifting weights, as part of your weekly exercise routine. Try to do these types of exercises for 30 minutes at least 3 days a week.  Do not use any products that contain nicotine or tobacco, such as cigarettes and e-cigarettes. If you need help quitting, ask your health care provider.  Monitor your blood pressure at home as told by your health care provider.  Keep all follow-up visits as told by your health care provider. This is important. Medicines  Take over-the-counter and prescription medicines only as told by your health care provider. Follow directions carefully. Blood pressure medicines must be taken as prescribed.  Do not skip doses of blood pressure medicine. Doing this puts you at risk for problems and can make the medicine less effective.  Ask your health care provider about side effects or reactions to medicines that you should watch for. Contact a health care provider if:  You think you are having a reaction to a medicine you are taking.  You have  headaches that keep coming back (recurring).  You feel dizzy.  You have swelling in your ankles.  You have trouble with your vision. Get help right away if:  You develop a severe headache or confusion.  You have unusual weakness or numbness.  You feel faint.  You have severe pain in your chest or abdomen.  You vomit repeatedly.  You have trouble breathing. Summary  Hypertension is when the force of blood pumping through your arteries is too strong. If this condition is not controlled, it may put you at risk for serious complications.  Your personal target blood pressure may vary depending on your medical conditions, your age, and other factors. For most  people, a normal blood pressure is less than 120/80.  Hypertension is treated with lifestyle changes, medicines, or a combination of both. Lifestyle changes include weight loss, eating a healthy, low-sodium diet, exercising more, and limiting alcohol. This information is not intended to replace advice given to you by your health care provider. Make sure you discuss any questions you have with your health care provider. Document Released: 08/16/2005 Document Revised: 07/14/2016 Document Reviewed: 07/14/2016 Elsevier Interactive Patient Education  [employer].  they are available. The fastest way to get your results is to activate your My Chart account. Instructions are located on the last page of this paperwork. If you have not heard from [place] regarding the results in 2 weeks, please contact this office.         Edwina Barth, MD [facility]

## 2018-09-14 NOTE — Patient Instructions (Addendum)
If you have lab work done today you will be contacted with your lab results within the next 2 weeks.  If you have not heard from us then please contact us. The fastest way to get your results is to register for My Chart.   IF you received an x-ray today, you will receive an invoice from [facility]. Please contact [facility] at [phone] with questions or concerns regarding your invoice.   IF you received labwork today, you will receive an invoice from [facility]. Please contact [facility] at [phone] with questions or concerns regarding your invoice.   Our billing staff will not be able to assist you with questions regarding bills from these companies.  You will be contacted with the lab results as soon as Hypertension Hypertension, commonly called high blood pressure, is when the force of blood pumping through the arteries is too strong. The arteries are the blood vessels that carry blood from the heart throughout the body. Hypertension forces the heart to work harder to pump blood and may cause arteries to become narrow or stiff. Having untreated or uncontrolled hypertension can cause heart attacks, strokes, kidney disease, and other problems. A blood pressure reading consists of a higher number over a lower number. Ideally, your blood pressure should be below 120/80. The first ("top") number is called the systolic pressure. It is a measure of the pressure in your arteries as your heart beats. The second ("bottom") number is called the diastolic pressure. It is a measure of the pressure in your arteries as the heart relaxes. What are the causes? The cause of this condition is not known. What increases the risk? Some risk factors for high blood pressure are under your control. Others are not. Factors you can change  Smoking.  Having type 2 diabetes mellitus, high cholesterol, or both.  Not getting enough exercise or physical activity.  Being  overweight.  Having too much fat, sugar, calories, or salt (sodium) in your diet.  Drinking too much alcohol. Factors that are difficult or impossible to change  Having chronic kidney disease.  Having a family history of high blood pressure.  Age. Risk increases with age.  Race. You may be at higher risk if you are African-American.  Gender. Men are at higher risk than women before age 52. After age 52, women are at higher risk than men.  Having obstructive sleep apnea.  Stress. What are the signs or symptoms? Extremely high blood pressure (hypertensive crisis) may cause:  Headache.  Anxiety.  Shortness of breath.  Nosebleed.  Nausea and vomiting.  Severe chest pain.  Jerky movements you cannot control (seizures). How is this diagnosed? This condition is diagnosed by measuring your blood pressure while you are seated, with your arm resting on a surface. The cuff of the blood pressure monitor will be placed directly against the skin of your upper arm at the level of your heart. It should be measured at least twice using the same arm. Certain conditions can cause a difference in blood pressure between your right and left arms. Certain factors can cause blood pressure readings to be lower or higher than normal (elevated) for a short period of time:  When your blood pressure is higher when you are in a health care provider's office than when you are at home, this is called white coat hypertension. Most people with this condition do not need medicines.  When your blood pressure is higher at home than when you are in  a health care provider's office, this is called masked hypertension. Most people with this condition may need medicines to control blood pressure. If you have a high blood pressure reading during one visit or you have normal blood pressure with other risk factors:  You may be asked to return on a different day to have your blood pressure checked again.  You may  be asked to monitor your blood pressure at home for 1 week or longer. If you are diagnosed with hypertension, you may have other blood or imaging tests to help your health care provider understand your overall risk for other conditions. How is this treated? This condition is treated by making healthy lifestyle changes, such as eating healthy foods, exercising more, and reducing your alcohol intake. Your health care provider may prescribe medicine if lifestyle changes are not enough to get your blood pressure under control, and if:  Your systolic blood pressure is above 130.  Your diastolic blood pressure is above 80. Your personal target blood pressure may vary depending on your medical conditions, your age, and other factors. Follow these instructions at home: Eating and drinking   Eat a diet that is high in fiber and potassium, and low in sodium, added sugar, and fat. An example eating plan is called the DASH (Dietary Approaches to Stop Hypertension) diet. To eat this way: ? Eat plenty of fresh fruits and vegetables. Try to fill half of your plate at each meal with fruits and vegetables. ? Eat whole grains, such as whole wheat pasta, brown rice, or whole grain bread. Fill about one quarter of your plate with whole grains. ? Eat or drink low-fat dairy products, such as skim milk or low-fat yogurt. ? Avoid fatty cuts of meat, processed or cured meats, and poultry with skin. Fill about one quarter of your plate with lean proteins, such as fish, chicken without skin, beans, eggs, and tofu. ? Avoid premade and processed foods. These tend to be higher in sodium, added sugar, and fat.  Reduce your daily sodium intake. Most people with hypertension should eat less than 1,500 mg of sodium a day.  Limit alcohol intake to no more than 1 drink a day for nonpregnant women and 2 drinks a day for men. One drink equals 12 oz of beer, 5 oz of wine, or 1 oz of hard liquor. Lifestyle   Work with your  health care provider to maintain a healthy body weight or to lose weight. Ask what an ideal weight is for you.  Get at least 30 minutes of exercise that causes your heart to beat faster (aerobic exercise) most days of the week. Activities may include walking, swimming, or biking.  Include exercise to strengthen your muscles (resistance exercise), such as pilates or lifting weights, as part of your weekly exercise routine. Try to do these types of exercises for 30 minutes at least 3 days a week.  Do not use any products that contain nicotine or tobacco, such as cigarettes and e-cigarettes. If you need help quitting, ask your health care provider.  Monitor your blood pressure at home as told by your health care provider.  Keep all follow-up visits as told by your health care provider. This is important. Medicines  Take over-the-counter and prescription medicines only as told by your health care provider. Follow directions carefully. Blood pressure medicines must be taken as prescribed.  Do not skip doses of blood pressure medicine. Doing this puts you at risk for problems and can make the  medicine less effective.  Ask your health care provider about side effects or reactions to medicines that you should watch for. Contact a health care provider if:  You think you are having a reaction to a medicine you are taking.  You have headaches that keep coming back (recurring).  You feel dizzy.  You have swelling in your ankles.  You have trouble with your vision. Get help right away if:  You develop a severe headache or confusion.  You have unusual weakness or numbness.  You feel faint.  You have severe pain in your chest or abdomen.  You vomit repeatedly.  You have trouble breathing. Summary  Hypertension is when the force of blood pumping through your arteries is too strong. If this condition is not controlled, it may put you at risk for serious complications.  Your personal  target blood pressure may vary depending on your medical conditions, your age, and other factors. For most people, a normal blood pressure is less than 120/80.  Hypertension is treated with lifestyle changes, medicines, or a combination of both. Lifestyle changes include weight loss, eating a healthy, low-sodium diet, exercising more, and limiting alcohol. This information is not intended to replace advice given to you by your health care provider. Make sure you discuss any questions you have with your health care provider. Document Released: 08/16/2005 Document Revised: 07/14/2016 Document Reviewed: 07/14/2016 Elsevier Interactive Patient Education  [employer].  they are available. The fastest way to get your results is to activate your My Chart account. Instructions are located on the last page of this paperwork. If you have not heard from [place] regarding the results in 2 weeks, please contact this office.

## 2018-10-02 ENCOUNTER — Ambulatory Visit (INDEPENDENT_AMBULATORY_CARE_PROVIDER_SITE_OTHER): Payer: Medicare HMO | Admitting: Orthopedic Surgery

## 2018-10-03 DIAGNOSIS — M2550 Pain in unspecified joint: Secondary | ICD-10-CM | POA: Diagnosis not present

## 2018-10-03 DIAGNOSIS — D8989 Other specified disorders involving the immune mechanism, not elsewhere classified: Secondary | ICD-10-CM | POA: Diagnosis not present

## 2018-10-03 DIAGNOSIS — M797 Fibromyalgia: Secondary | ICD-10-CM | POA: Diagnosis not present

## 2018-10-03 DIAGNOSIS — F419 Anxiety disorder, unspecified: Secondary | ICD-10-CM | POA: Diagnosis not present

## 2018-10-03 DIAGNOSIS — M329 Systemic lupus erythematosus, unspecified: Secondary | ICD-10-CM | POA: Diagnosis not present

## 2018-10-03 DIAGNOSIS — I10 Essential (primary) hypertension: Secondary | ICD-10-CM | POA: Diagnosis not present

## 2018-10-03 DIAGNOSIS — R5383 Other fatigue: Secondary | ICD-10-CM | POA: Diagnosis not present

## 2018-10-11 ENCOUNTER — Ambulatory Visit (INDEPENDENT_AMBULATORY_CARE_PROVIDER_SITE_OTHER): Payer: Medicare HMO | Admitting: Orthopedic Surgery

## 2020-05-14 ENCOUNTER — Telehealth: Payer: Self-pay | Admitting: *Deleted

## 2020-05-14 NOTE — Telephone Encounter (Signed)
Schedule AWV.  

## 2020-11-10 DIAGNOSIS — Z79899 Other long term (current) drug therapy: Secondary | ICD-10-CM | POA: Diagnosis not present

## 2020-11-10 DIAGNOSIS — F17210 Nicotine dependence, cigarettes, uncomplicated: Secondary | ICD-10-CM | POA: Diagnosis not present

## 2020-11-10 DIAGNOSIS — F1290 Cannabis use, unspecified, uncomplicated: Secondary | ICD-10-CM | POA: Diagnosis not present

## 2020-11-10 DIAGNOSIS — M329 Systemic lupus erythematosus, unspecified: Secondary | ICD-10-CM | POA: Diagnosis not present

## 2020-11-10 DIAGNOSIS — F419 Anxiety disorder, unspecified: Secondary | ICD-10-CM | POA: Diagnosis not present

## 2020-11-10 DIAGNOSIS — R7303 Prediabetes: Secondary | ICD-10-CM | POA: Diagnosis not present

## 2020-11-10 DIAGNOSIS — I10 Essential (primary) hypertension: Secondary | ICD-10-CM | POA: Diagnosis not present

## 2020-11-10 DIAGNOSIS — E559 Vitamin D deficiency, unspecified: Secondary | ICD-10-CM | POA: Diagnosis not present

## 2020-11-10 DIAGNOSIS — D649 Anemia, unspecified: Secondary | ICD-10-CM | POA: Diagnosis not present

## 2020-11-18 DIAGNOSIS — F419 Anxiety disorder, unspecified: Secondary | ICD-10-CM | POA: Diagnosis not present

## 2020-11-18 DIAGNOSIS — M329 Systemic lupus erythematosus, unspecified: Secondary | ICD-10-CM | POA: Diagnosis not present

## 2020-11-18 DIAGNOSIS — I10 Essential (primary) hypertension: Secondary | ICD-10-CM | POA: Diagnosis not present

## 2020-11-18 DIAGNOSIS — M2550 Pain in unspecified joint: Secondary | ICD-10-CM | POA: Diagnosis not present

## 2020-11-18 DIAGNOSIS — R5383 Other fatigue: Secondary | ICD-10-CM | POA: Diagnosis not present

## 2020-11-18 DIAGNOSIS — M797 Fibromyalgia: Secondary | ICD-10-CM | POA: Diagnosis not present

## 2020-12-26 ENCOUNTER — Ambulatory Visit (INDEPENDENT_AMBULATORY_CARE_PROVIDER_SITE_OTHER): Payer: Medicare HMO | Admitting: Surgical

## 2020-12-26 ENCOUNTER — Ambulatory Visit (INDEPENDENT_AMBULATORY_CARE_PROVIDER_SITE_OTHER): Payer: Medicare HMO

## 2020-12-26 DIAGNOSIS — M25561 Pain in right knee: Secondary | ICD-10-CM | POA: Diagnosis not present

## 2020-12-26 DIAGNOSIS — G8929 Other chronic pain: Secondary | ICD-10-CM

## 2020-12-26 DIAGNOSIS — M5441 Lumbago with sciatica, right side: Secondary | ICD-10-CM | POA: Diagnosis not present

## 2020-12-29 ENCOUNTER — Telehealth: Payer: Self-pay | Admitting: Surgical

## 2020-12-29 ENCOUNTER — Other Ambulatory Visit: Payer: Self-pay | Admitting: Surgical

## 2020-12-29 MED ORDER — TRAMADOL HCL 50 MG PO TABS: 50.0000 mg | ORAL_TABLET | Freq: Two times a day (BID) | ORAL | 0 refills | Status: DC | PRN

## 2020-12-29 NOTE — Telephone Encounter (Signed)
IC advised.  

## 2020-12-29 NOTE — Telephone Encounter (Signed)
Sent in

## 2020-12-29 NOTE — Telephone Encounter (Signed)
Pls advise.  

## 2020-12-29 NOTE — Telephone Encounter (Signed)
Patient called advised the Rx for (Tramadol) is not at the pharmacy yet. Patent uses the [facility] on [address]. The number to contact patient is [phone]

## 2020-12-30 ENCOUNTER — Encounter: Payer: Self-pay | Admitting: Surgical

## 2020-12-30 NOTE — Progress Notes (Signed)
Office Visit Note   Patient: Dana Turner           Date of Birth: 04-Apr-1966           MRN: [id number] Visit Date: 12/26/2020 Requested by: Georgina Quint, MD [address] [place],  [place] [address] PCP: Georgina Quint, MD  Subjective: Chief Complaint  Patient presents with  . Right Knee - Pain    HPI: Dana Turner is a 55 y.o. female who presents to the office complaining of right knee pain.  Patient complains of right knee pain that has been ongoing over the last year but worse in the last couple months.  She complains of anterior and medial right knee pain.  This pain wakes her up at night and bothers her more with changes in the weather.  Denies any groin pain.  She does have history of several falls last year with recurrent instability of the right knee.  She has history of right knee ACL reconstruction about 4 years ago when she lived in [place].  She describes a meniscus surgery but cannot recall exactly what was done.  She notes that the right knee pain bothers her about as much as the instability of the right knee.  She also endorses low back pain with radicular pain from the anterior thigh to the anterior shin as well as numbness and tingling that is only really present in the anterior knee.  She has been taking Tylenol without any relief.  No history of knee surgery on the other knee.  No history of CAD, stroke, CKD, blood thinner use.  She does have history of lupus for which she takes Plaquenil and the occasional tramadol..                ROS: All systems reviewed are negative as they relate to the chief complaint within the history of present illness.  Patient denies fevers or chills.  Assessment & Plan: Visit Diagnoses:  1. Right-sided low back pain with right-sided sciatica, unspecified chronicity   2. Chronic pain of right knee     Plan: Patient is a 55 year old female who presents complaining of right knee pain and instability.  She has history of  lupus.  She has had several falls since last year with recurrent instability that has been worse since then.  She does have history of ACL reconstruction about 4 years ago of the right knee and states that this helped for a time but now she has constant instability.  On exam there is no discernible effusion and no ligamentous laxity that can be definitively noted but she has a very difficult time relaxing due to the pain that she is in.  She does have well-preserved joint spaces on radiographs taken today with evidence of prior ACL reconstruction.  There is a small fragment noted off the proximal MCL attachment with associated tenderness on exam.  Plan to order MRI of the right knee for further evaluation of right knee instability.  With patient's history of back pain and radicular pain down her anterior right leg, as well as the hip flexor weakness noted today, plan to order MRI of the lumbar spine for further evaluation of lumbar radiculopathy as a potential cause of her right leg instability and weakness.  She would also like to try physical therapy which I think is reasonable in the meantime.  Plan to refer patient to physical therapy for right leg strengthening of all muscle groups.  Prescribed tramadol for  pain control.  Plan to follow-up after MRI scans to review results.  Patient agreed with this plan.  Follow-Up Instructions: No follow-ups on file.   Orders:  Orders Placed This Encounter  Procedures  . XR KNEE 3 VIEW RIGHT  . XR Lumbar Spine 2-3 Views  . MR Knee Right w/o contrast  . MR Lumbar Spine w/o contrast  . Ambulatory referral to Physical Therapy   No orders of the defined types were placed in this encounter.     Procedures: No procedures performed   Clinical Data: No additional findings.  Objective: Vital Signs: There were no vitals taken for this visit.  Physical Exam:  Constitutional: Patient appears well-developed HEENT:  Head: Normocephalic Eyes:EOM are  normal Neck: Normal range of motion Cardiovascular: Normal rate Pulmonary/chest: Effort normal Neurologic: Patient is alert Skin: Skin is warm Psychiatric: Patient has normal mood and affect  Ortho Exam: Ortho exam demonstrates right knee without effusion.  She has 0 degrees of extension and about 120 degrees of knee flexion.  No calf tenderness.  She does have tenderness over the medial and lateral joint lines as well as the proximal attachment of the MCL.  She is able to extend the right knee and perform straight leg raise.  There is no significantly increased laxity with anterior or posterior drawer compared to the contralateral knee.  Negative Lachman's exam.  No increased laxity to varus/valgus stress.  However, her knee is difficult to examine as she does not relax despite numerous attempts to have the patient relax in both the seated and supine position.  No pain with hip range of motion.  4/5 motor strength of right hip flexor compared with 5/5 motor strength of left hip flexor.  4/5 motor strength of right dorsiflexion compared with 5/5 motor strength of left dorsiflexion.  Specialty Comments:  No specialty comments available.  Imaging: No results found.   PMFS History: Patient Active Problem List   Diagnosis Date Noted  . Essential hypertension 09/14/2018  . History of lupus 09/14/2018  . Chronic pain of right knee 09/14/2018  . History of tear of ACL (anterior cruciate ligament) 09/14/2018   Past Medical History:  Diagnosis Date  . Anxiety   . Depression   . Hypertension   . Legally blind in left eye, as defined in [place]   . Lupus (HCC)     No family history on file.  Past Surgical History:  Procedure Laterality Date  . ABDOMINAL HYSTERECTOMY  1996  . EYE SURGERY Left 2016   Social History   Occupational History  . Not on file  Tobacco Use  . Smoking status: Current Every Day Smoker    Packs/day: 0.50    Years: 10.00    Pack years: 5.00    Types: Cigarettes   . Smokeless tobacco: Never Used  Substance and Sexual Activity  . Alcohol use: Yes    Alcohol/week: 1.0 standard drink    Types: 1 Glasses of wine per week  . Drug use: Never  . Sexual activity: Not on file

## 2021-01-09 ENCOUNTER — Ambulatory Visit
Admission: RE | Admit: 2021-01-09 | Discharge: 2021-01-09 | Disposition: A | Discharge status: Home / Self Care | Payer: Medicare HMO | Source: Ambulatory Visit | Attending: Surgical | Admitting: Surgical

## 2021-01-09 ENCOUNTER — Other Ambulatory Visit: Payer: Self-pay

## 2021-01-09 DIAGNOSIS — G8929 Other chronic pain: Secondary | ICD-10-CM

## 2021-01-09 DIAGNOSIS — M5441 Lumbago with sciatica, right side: Secondary | ICD-10-CM

## 2021-01-09 IMAGING — MR MR KNEE*R* W/O CM
4 of 6 series · 19 of 40 positions shown · non-contrast
Comparison: Right knee x-rays dated [DATE].

CLINICAL DATA: Right knee pain and weakness for the past 3 months.
No injury. History of prior MCL reconstruction and meniscectomy.

EXAM:
MRI OF THE RIGHT KNEE WITHOUT CONTRAST
TECHNIQUE: Multiplanar, multisequence MR imaging of the knee was performed. No
intravenous contrast was administered.

[Series 2: T2 fat-sat · axial · 4.0mm · 0.29mm/px · z∈[-37,+51]mm · 3 of 25 slices shown (1 of 2)]
[im 5/25]
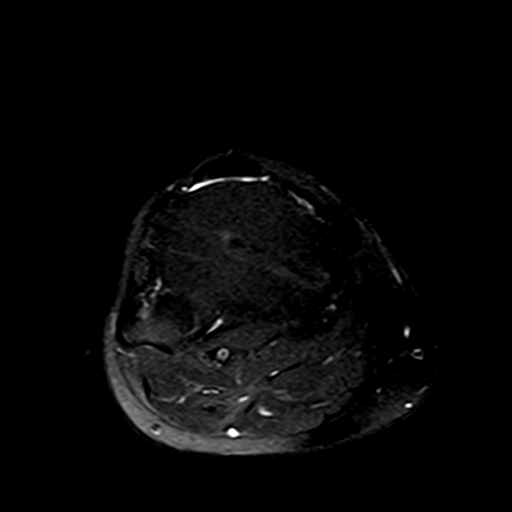
[im 15/25]
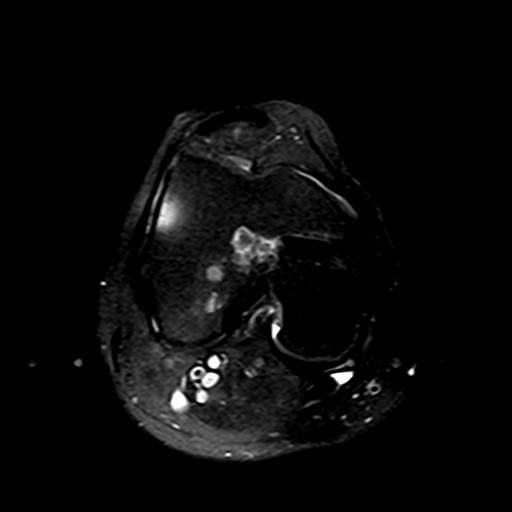
[im 25/25]
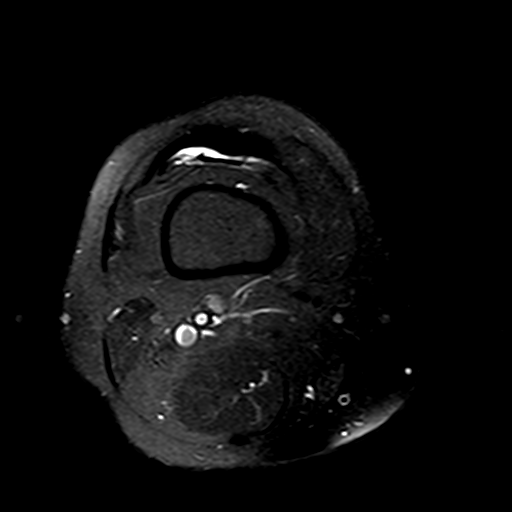

[Series 4: T2 fat-sat · coronal · 4.0mm · 0.29mm/px · 3 of 24 slices shown (2 of 2)]
[im 5/24]
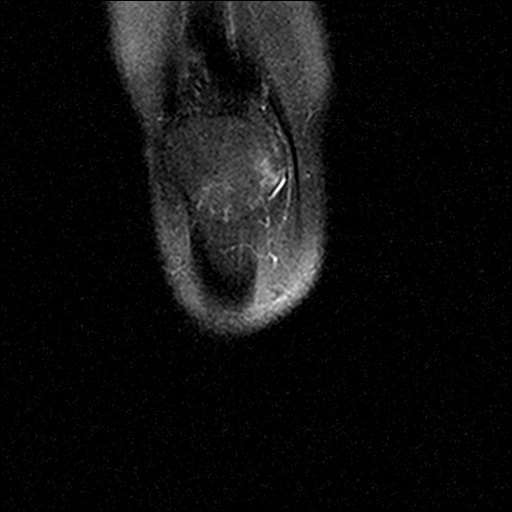
[im 14/24]
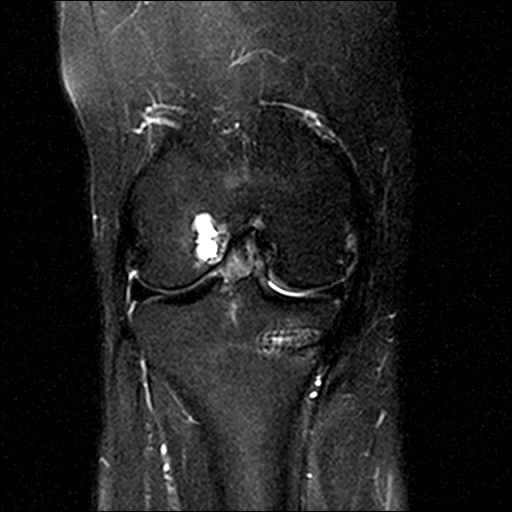
[im 24/24]
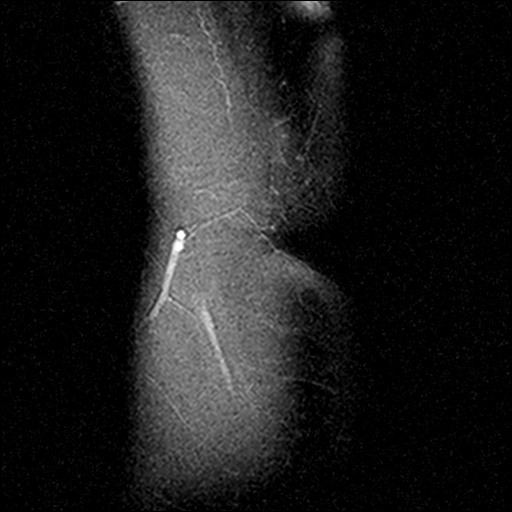

[Series 5: PD fat-sat · coronal · 3.0mm · 0.29mm/px · 7 of 28 slices shown (1 of 2)]
[im 1/28]
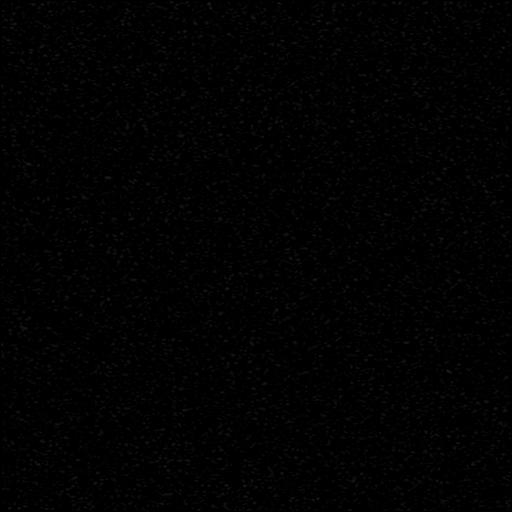
[im 5/28]
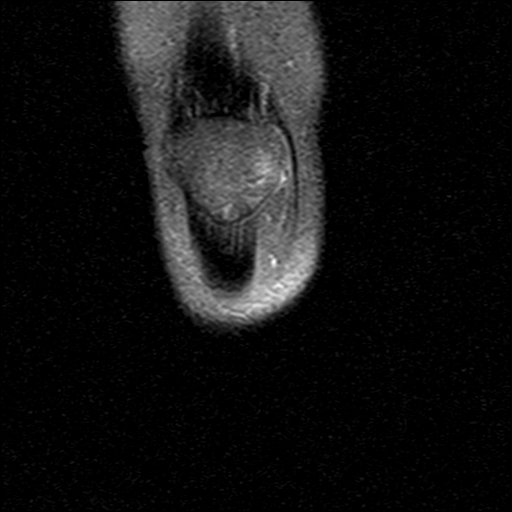
[im 10/28]
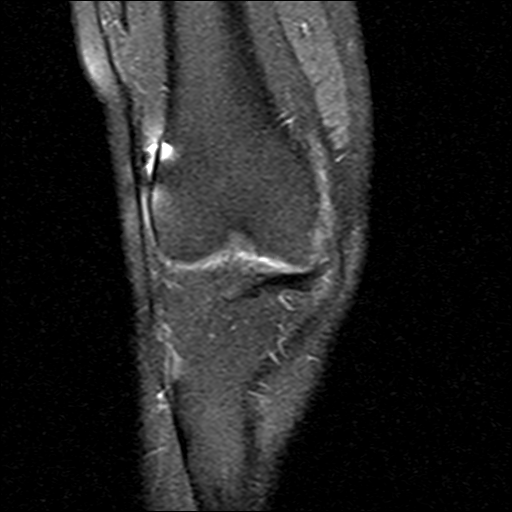
[im 14/28]
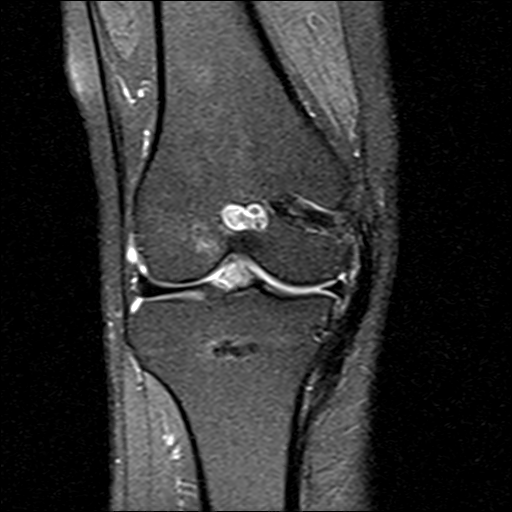
[im 19/28]
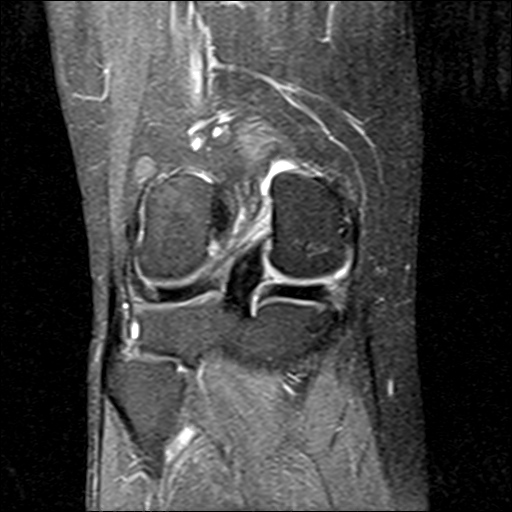
[im 23/28]
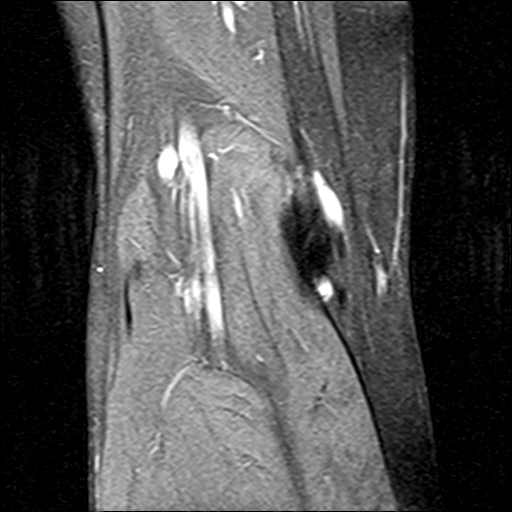
[im 28/28]
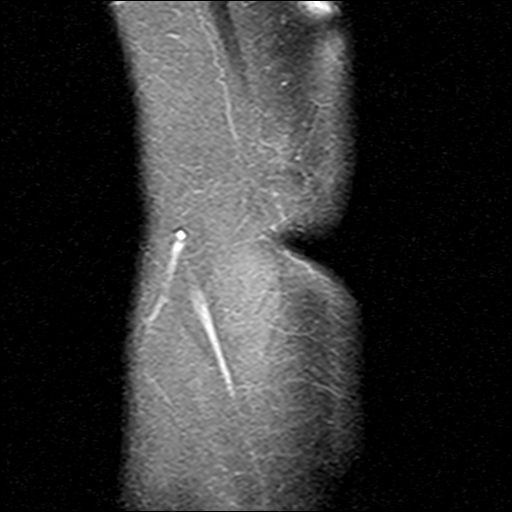

[Series 6: PD fat-sat · sagittal · 3.0mm · 0.29mm/px · 6 of 30 slices shown (2 of 2)]
[im 1/30]
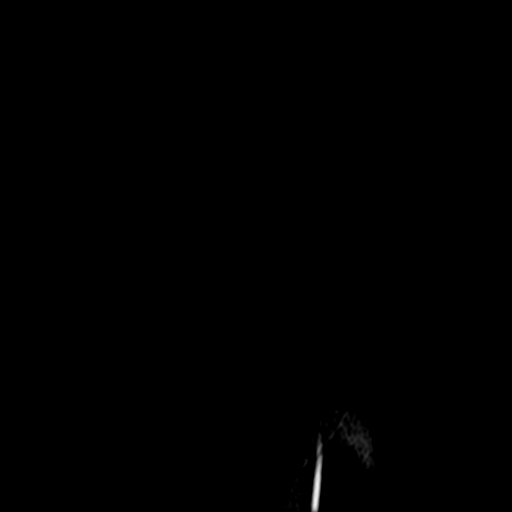
[im 5/30]
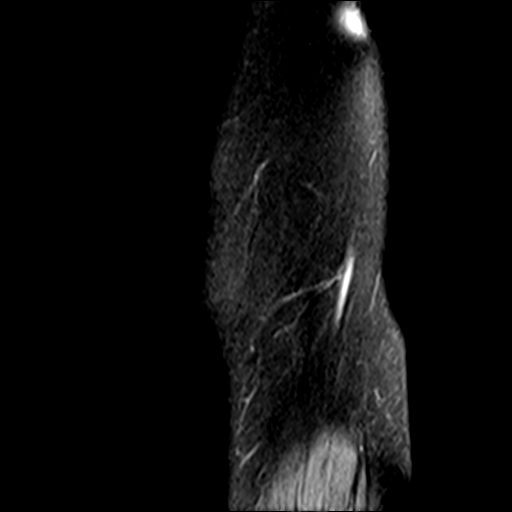
[im 9/30]
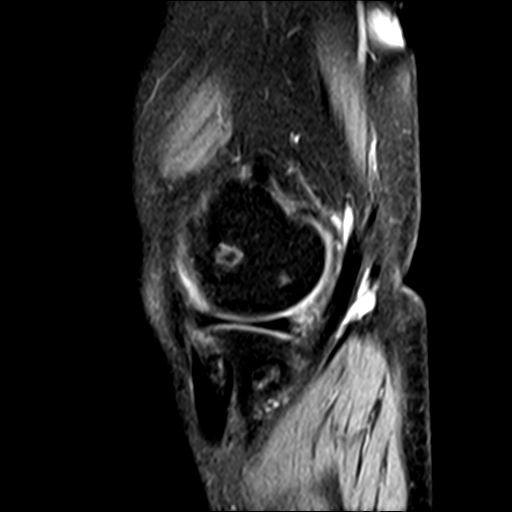
[im 13/30]
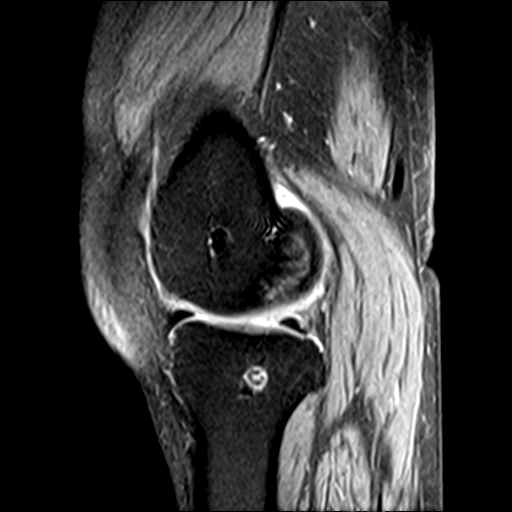
[im 17/30]
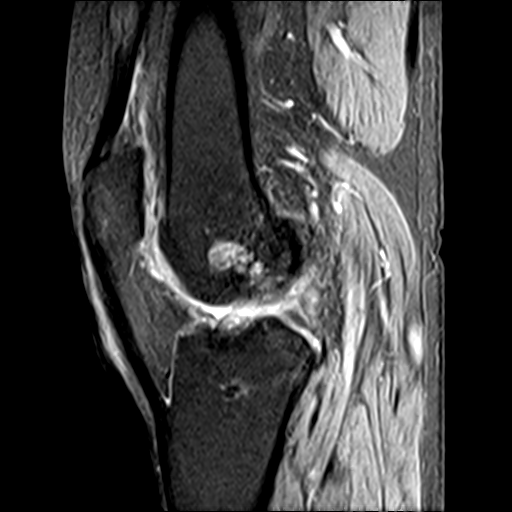
[im 25/30]
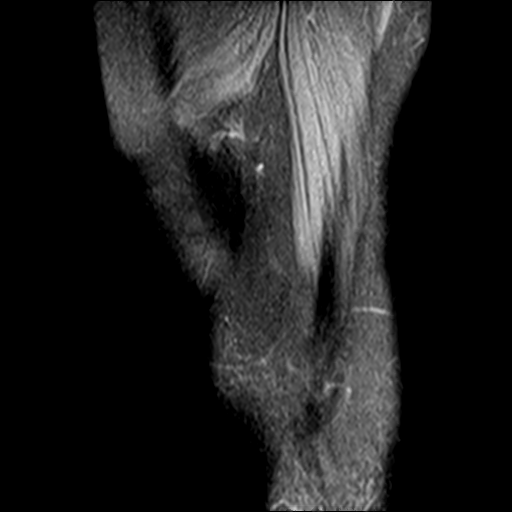

[19 of 40 positions shown; findings below may reference images not displayed]

FINDINGS: MENISCI

Medial meniscus:  Intact.

Lateral meniscus:  Intact.

LIGAMENTS

Cruciates: Intact ACL and PCL. Mild mucoid degeneration of the ACL.

Collaterals: Prior MCL reconstruction with intact graft. Thickening
of the graft is likely postsurgical. Intact lateral collateral
ligament complex.

CARTILAGE

Patellofemoral: Mild partial-thickness cartilage loss over the
medial patellar facet.

Medial:  No chondral defect.

Lateral:  No chondral defect.

Joint:  No significant joint effusion.  Normal Hoffa's fat.

Popliteal Fossa:  Tiny Baker cyst.  Intact popliteus tendon.

Extensor Mechanism: Intact quadriceps tendon and patellar tendon.
Intact medial and lateral patellar retinaculum. Intact MPFL.

Bones: No acute fracture or dislocation. No suspicious bone lesion.
1.4 cm intraosseous ganglion cyst in the mesial aspect of the
lateral femoral condyle. Small chronic bone infarct in the roof of
the femoral intercondylar notch.

Other: None.
IMPRESSION: 1. No evidence of internal derangement.
2. Prior MCL reconstruction with intact graft.
3. Mild mucoid degeneration of the ACL.
4. Mild partial-thickness cartilage loss over the medial patellar
facet.
5. Small chronic bone infarct in the roof of the femoral
intercondylar notch.

## 2021-01-09 IMAGING — MR MR LUMBAR SPINE W/O CM
4 of 5 series · 27 of 48 positions shown · non-contrast
Comparison: Lumbar spine x-rays dated [DATE].

CLINICAL DATA: Low back pain radiating down the right leg for the
past 3 months. No injury or prior surgery.

EXAM:
MRI LUMBAR SPINE WITHOUT CONTRAST
TECHNIQUE: Multiplanar, multisequence MR imaging of the lumbar spine was
performed. No intravenous contrast was administered.

[Series 2: T2 · sagittal · 4.0mm · 1.09mm/px · 6 of 17 slices shown (1 of 2)]
[im 1/17]
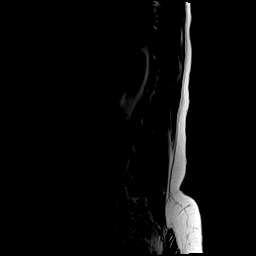
[im 4/17]
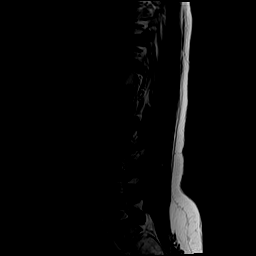
[im 7/17]
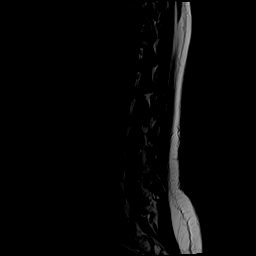
[im 10/17]
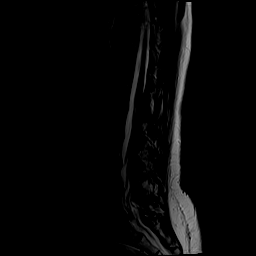
[im 13/17]
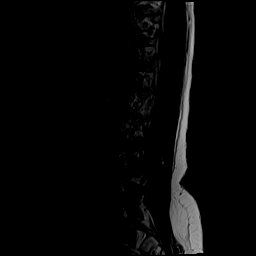
[im 17/17]
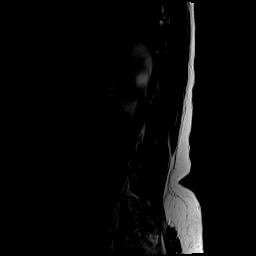

[Series 4: T1 · sagittal · 4.0mm · 1.09mm/px · 6 of 17 slices shown (1 of 2)]
[im 1/17]
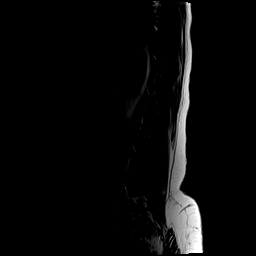
[im 4/17]
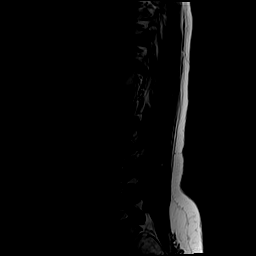
[im 7/17]
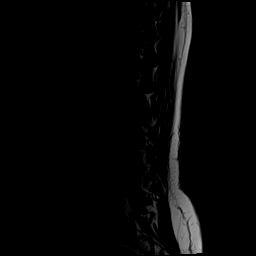
[im 10/17]
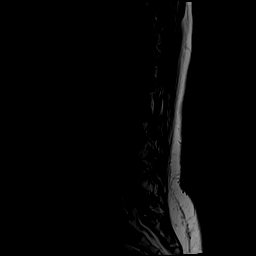
[im 13/17]
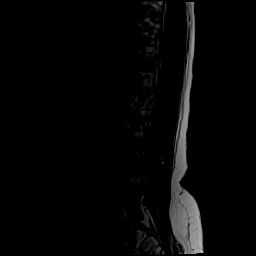
[im 17/17]
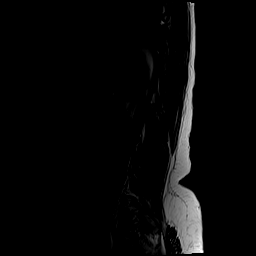

[Series 5: T2 · axial · 4.0mm · 0.39mm/px · z∈[-152,+64]mm · 9 of 42 slices shown (2 of 2)]
[im 1/42]
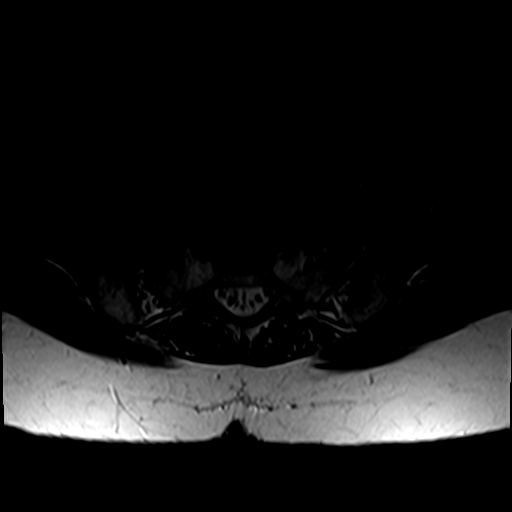
[im 6/42]
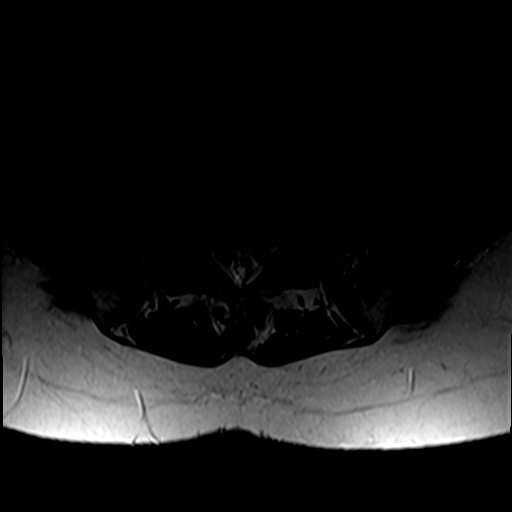
[im 12/42]
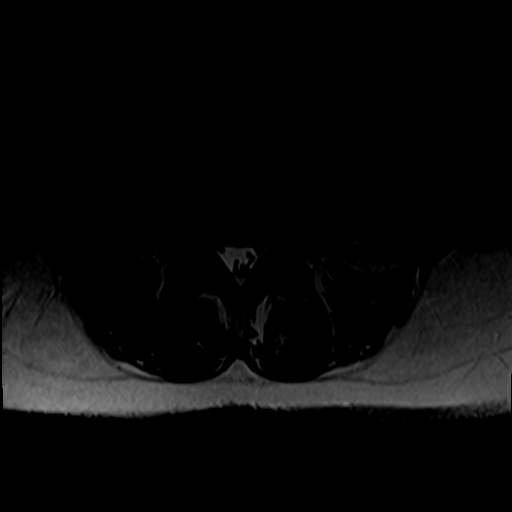
[im 18/42]
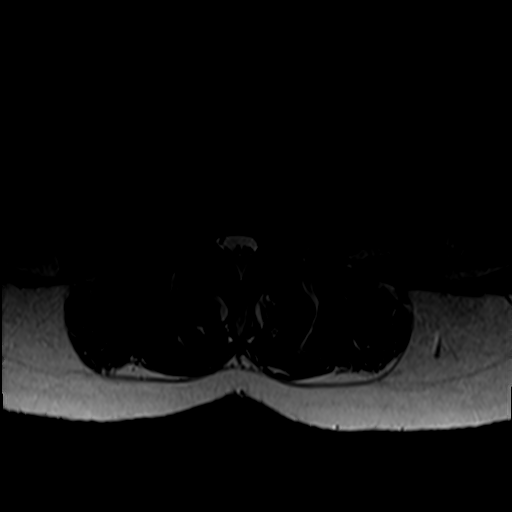
[im 21/42]
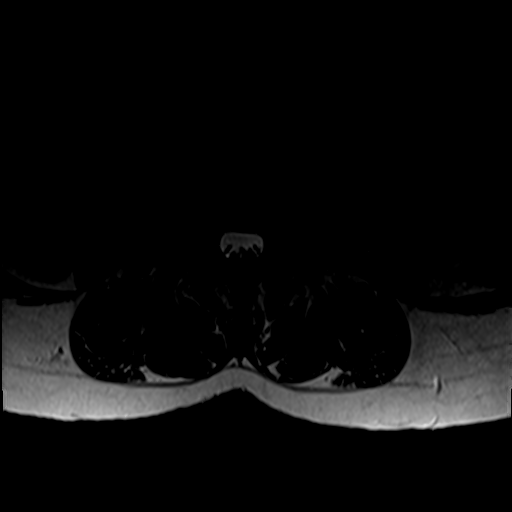
[im 24/42]
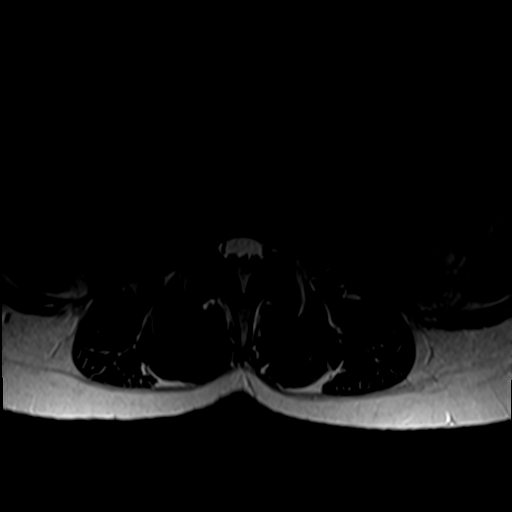
[im 30/42]
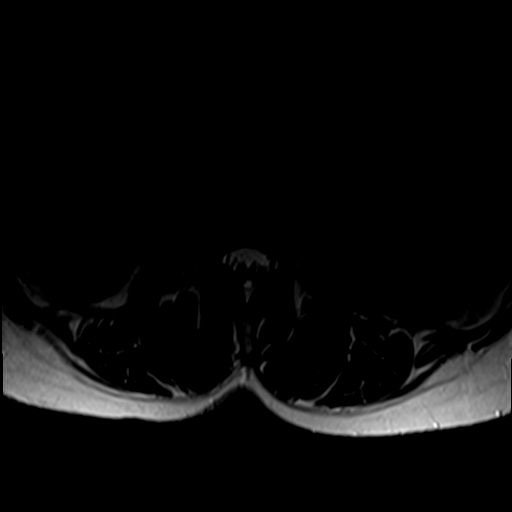
[im 36/42]
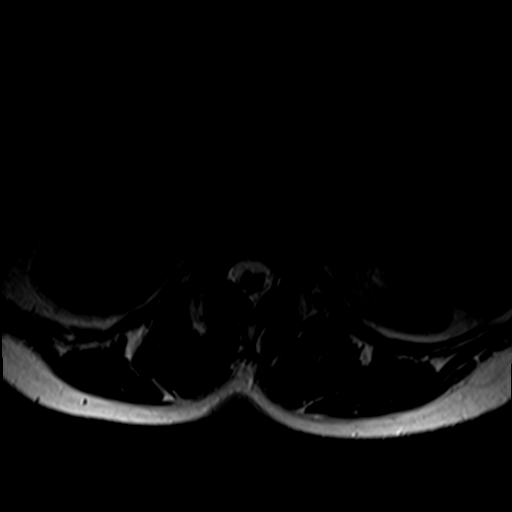
[im 42/42]
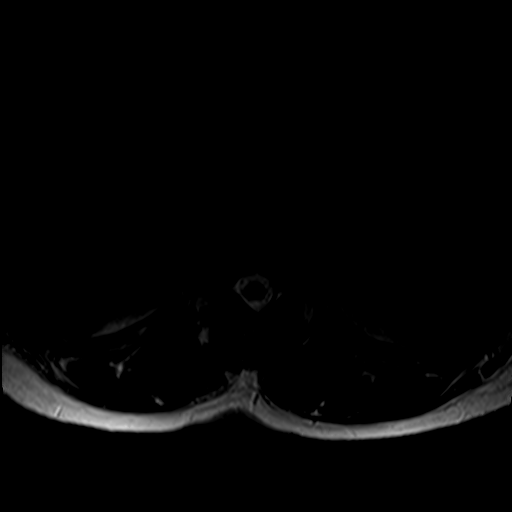

[Series 6: T1 · axial · 4.0mm · 0.39mm/px · z∈[-152,+35]mm · 6 of 42 slices shown (2 of 2)]
[im 1/42]
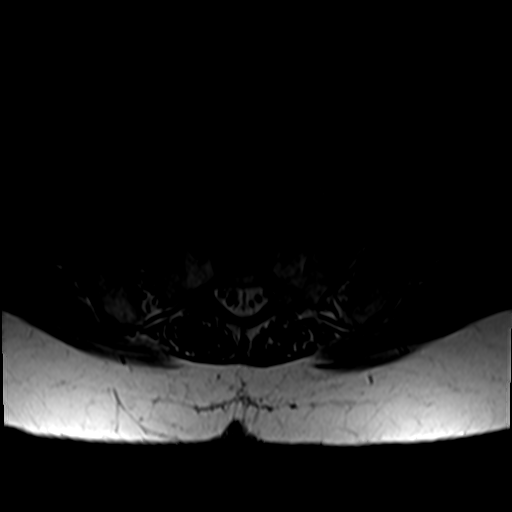
[im 6/42]
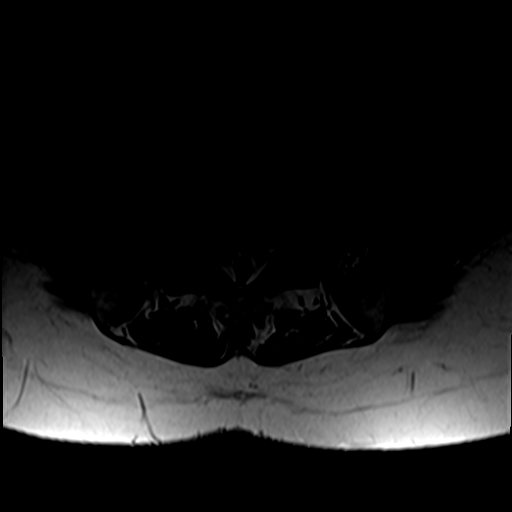
[im 12/42]
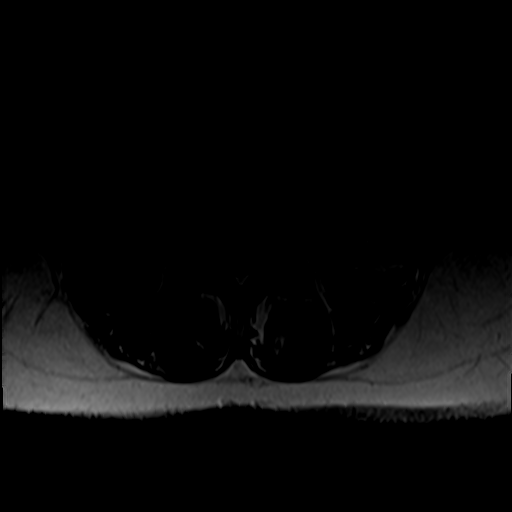
[im 18/42]
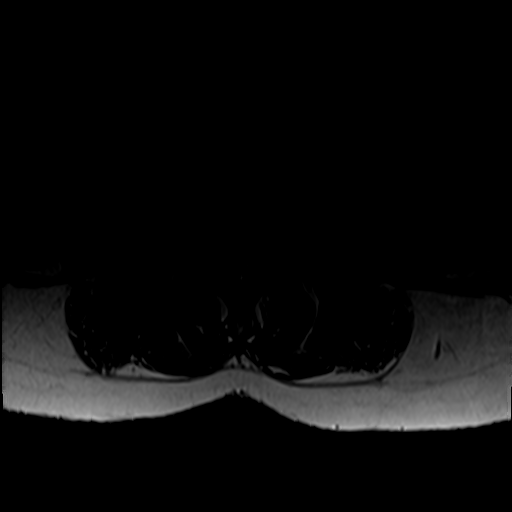
[im 21/42]
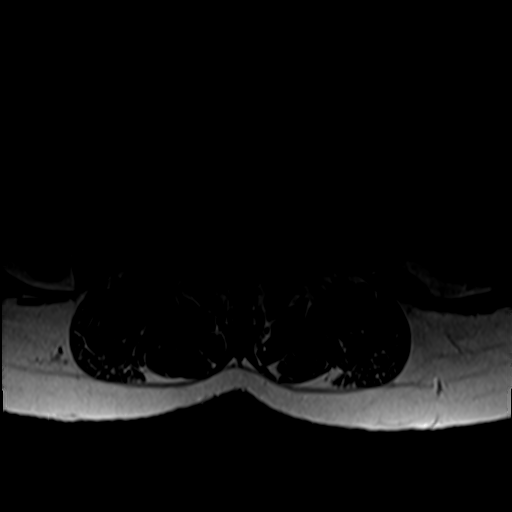
[im 36/42]
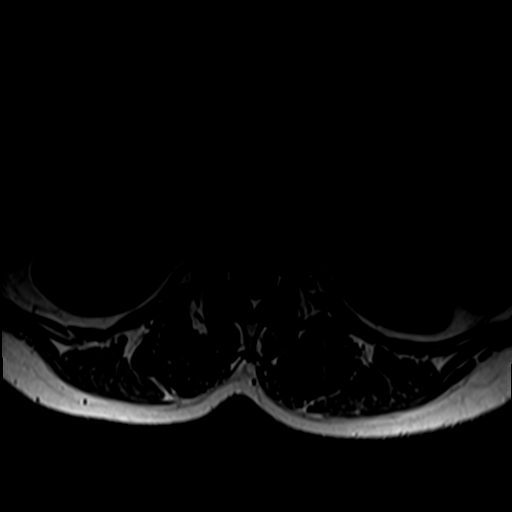

[27 of 48 positions shown; findings below may reference images not displayed]

FINDINGS: Segmentation:  Standard.

Alignment:  Physiologic.

Vertebrae:  No fracture, evidence of discitis, or bone lesion.

Conus medullaris and cauda equina: Conus extends to the L2 level.
Conus and cauda equina appear normal.

Paraspinal and other soft tissues: Small bilateral renal cysts.
Otherwise negative.

Disc levels:

T10-T11 to L1-L2: Negative.

L2-L3:  Small left foraminal disc protrusion.  No stenosis.

L3-L4: Mild disc bulging eccentric to the left. Mild bilateral facet
arthropathy. Mild left lateral recess and neuroforaminal stenosis.
No spinal canal or right neuroforaminal stenosis.

L4-L5: Small broad-based posterior disc protrusion and mild
bilateral facet arthropathy. Mild left neuroforaminal stenosis. No
spinal canal or right neuroforaminal stenosis.

L5-S1: Small broad-based posterior disc protrusion and mild
bilateral facet arthropathy. No stenosis.
IMPRESSION: 1. Mild multilevel lumbar spondylosis as described above. No
high-grade stenosis or impingement.

## 2021-01-12 ENCOUNTER — Ambulatory Visit: Payer: Medicare HMO | Attending: Surgical

## 2021-01-12 ENCOUNTER — Other Ambulatory Visit: Payer: Self-pay

## 2021-01-12 DIAGNOSIS — R296 Repeated falls: Secondary | ICD-10-CM | POA: Insufficient documentation

## 2021-01-12 DIAGNOSIS — M5441 Lumbago with sciatica, right side: Secondary | ICD-10-CM | POA: Diagnosis present

## 2021-01-12 DIAGNOSIS — M25561 Pain in right knee: Secondary | ICD-10-CM | POA: Insufficient documentation

## 2021-01-12 DIAGNOSIS — M6281 Muscle weakness (generalized): Secondary | ICD-10-CM | POA: Insufficient documentation

## 2021-01-12 DIAGNOSIS — R2681 Unsteadiness on feet: Secondary | ICD-10-CM | POA: Insufficient documentation

## 2021-01-12 NOTE — Patient Instructions (Signed)
Access Code: [email or web address] URL: [email or web address] Date: 01/12/2021 Prepared by: Claude Manges  Exercises Seated March - 1 x daily - 5 x weekly - 3 sets - 10 reps Seated Hip Abduction with Resistance - 1 x daily - 5 x weekly - 3 sets - 10 reps Seated Long Arc Quad - 1 x daily - 5 x weekly - 3 sets - 10 reps Sit to Stand with Counter Support - 1 x daily - 5 x weekly - 4 sets - 5 reps  Patient Education Sit to Stand with Four Wheel Walker

## 2021-01-12 NOTE — Therapy (Signed)
[facility] [address] [place], [place], [address] Phone: [phone]   Fax:  [phone]  Physical Therapy Evaluation  Patient Details  Name: Dana Turner MRN: [id number] Date of Birth: 01-22-1966 Referring Provider (PT): Magnant, Joycie Peek, [place] - [facility]   Encounter Date: 01/12/2021   PT End of Session - 01/12/21 1438    Visit Number 1    Number of Visits 17   16 + eval   Date for PT Re-Evaluation 03/18/21    Authorization Type [employer] and Medicaid    PT Start Time 1325    PT Stop Time 1405    PT Time Calculation (min) 40 min    Activity Tolerance Patient tolerated treatment well;Patient limited by fatigue;Patient limited by pain    Behavior During Therapy [facility] for tasks assessed/performed           Past Medical History:  Diagnosis Date  . Anxiety   . Depression   . Hypertension   . Legally blind in left eye, as defined in [place]   . Lupus [facility])     Past Surgical History:  Procedure Laterality Date  . ABDOMINAL HYSTERECTOMY  1996  . EYE SURGERY Left 2016    There were no vitals filed for this visit.    Subjective Assessment - 01/12/21 1327    Subjective session began about 10 minutes later d/t later check in. About 2 months ago began to notice it has been harder to walk and increasing low back pain into right leg and right knee pain as well. has had multiple falls and is very fearful of this. Previously walking without AD or assistance and now has alot of difficulty and pain with moving around.    Patient is accompained by: Family member   Nephew xavier   Pertinent History HTN, Lupus, Anxiety    Limitations Walking    Diagnostic tests 01/09/21: Impressions:   Lumbar MRI: 1. Mild multilevel lumbar spondylosis as described above. No  high-grade stenosis or impingement. RIGHT Knee: 1. No evidence of internal derangement.  2. Prior MCL reconstruction with intact graft.  3. Mild mucoid degeneration of  the ACL.  4. Mild partial-thickness cartilage loss over the medial patellar  facet.  5. 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    Patient Stated Goals less falls and instability, less pain, be able to walk better    Currently in Pain? Yes    Pain Score 6     Pain Location Back    Pain Orientation Right    Pain Descriptors / Indicators Sore;Spasm;Tightness    Pain Type Acute pain;Chronic pain    Pain Radiating Towards tingling into right knee and into foot.    Pain Relieving Factors tramadol for a short period.              [facility] PT Assessment - 01/12/21 1326      Assessment   Medical Diagnosis M25.561,G89.29 (ICD-10-CM) - Chronic pain of right knee  M54.41 (ICD-10-CM) - Right-sided low back pain with right-sided sciatica, unspecified chronicity    Referring Provider (PT) Magnant, Joycie Peek, PA-C - [facility]    Hand Dominance Right    Prior Therapy Years ago after ACL/mensical surgery on R knee      Balance Screen   Has the patient fallen in the past 6 months Yes    How many times? multiple times especially over last few months  Has the patient had a decrease in activity level because of a fear of falling?  Yes    Is the patient reluctant to leave their home because of a fear of falling?  Yes      Home Environment   Additional Comments Lives with sister and nephew (x2, one is 19yo). house with 6-8 steps to enter and about 13-14 to get to bedroom. 1 sided HR. Currently doing 1 at a time and holding on.      Prior Function   Level of Independence Independent with household mobility with device;Independent with basic ADLs   CLOF: uses rollator which was purchased recently (also recently got a cane and shower seat as well)   Vocation On disability    Leisure previously enjoyed going for walks      Cognition   Overall Cognitive Status Within Functional Limits for tasks assessed      Observation/Other Assessments   Observations overall  diminished postural control, shaky quality of movement of extremities with functional mobility.    Focus on Therapeutic Outcomes (FOTO)  35%      Coordination   Gross Motor Movements are Fluid and Coordinated No    Coordination and Movement Description heel shin test and foot tapping alternating: very shaky quality of mvmt bilateral with BLE, ataxic like in nature      Tone   Assessment Location Right Lower Extremity;Left Lower Extremity      ROM / Strength   AROM / PROM / Strength Strength;AROM      AROM   Overall AROM Comments Lumbar ROM grossly limited 50%. 0-105 knee flexion B no change in pain      Strength   Overall Strength Comments RLE grossly 3/5 + pain, LLE grossly 3-/5      Ambulation/Gait   Gait Comments Rollator, frequently pushing it away from herself,  very slow, shaky quality of movement with NBOS, antalgic, hip drop B, trunk fwd flexed, shoudlers elevated.      Standardized Balance Assessment   Standardized Balance Assessment Berg Balance Test;Timed Up and Go Test;Five Times Sit to Stand    Five times sit to stand comments  17 seconds with BUE support and B knee valgus, shoulder hiking.      Berg Balance Test   Sit to Stand Able to stand using hands after several tries    Standing Unsupported Unable to stand 30 seconds unassisted   very fearful   Sitting with Back Unsupported but Feet Supported on Floor or Stool Able to sit safely and securely 2 minutes    Stand to Sit Sits independently, has uncontrolled descent    Transfers Needs one person to assist   with AD/rollator   Standing Unsupported with Eyes Closed Needs help to keep from falling    Standing Unsupported with Feet Together Needs help to attain position and unable to hold for 15 seconds    From Standing, Reach Forward with Outstretched Arm Loses balance while trying/requires external support    From Standing Position, Pick up Object from Floor Unable to try/needs assist to keep balance    From Standing  Position, Turn to Look Behind Over each Shoulder Needs supervision when turning    Turn 360 Degrees Needs assistance while turning    Standing Unsupported, Alternately Place Feet on Step/Stool Needs assistance to keep from falling or unable to try    Standing Unsupported, One Foot in [employer] balance while stepping or standing    Standing on One Leg  Unable to try or needs assist to prevent fall    Total Score 9    Berg comment: HIGH RISK FOR FALLS      Timed Up and Go Test   Normal TUG (seconds) 30   with rollator, CS, frequently pushing rollator away from her     RLE Tone   RLE Tone --   questionable increase in mms tone at the right knee  with testing                     Objective measurements completed on examination: See above findings.               PT Education - 01/12/21 1437    Education Details Initial PT POC and HEP provided. Access Code: [email or web address]. Alot of education adn reinforcement regarding safe sequencing and use of rollator with sit to stand t/fs and with ambulation.    Person(s) Educated Patient;Other (comment)   Nephew   Methods Explanation;Demonstration;Handout    Comprehension Verbalized understanding;Returned demonstration;Need further instruction            PT Short Term Goals - 01/12/21 1428      PT SHORT TERM GOAL #1   Title independent with initial HEP    Time 2    Period Weeks    Status New    Target Date 01/26/21      PT SHORT TERM GOAL #2   Title Pt will complete 5TSTS with no greater than 1 UE support in </= 13 seconds to demonstrate improved functional LE strength and balance    Time 5    Period Weeks    Status New    Target Date 02/16/21             PT Long Term Goals - 01/12/21 1431      PT LONG TERM GOAL #1   Title Independent with advanced HEP    Time 8    Period Weeks    Status New    Target Date 03/18/21      PT LONG TERM GOAL #2   Title BBS to at least 40/56 to demonstrate decreased fall risk  from baseline    Baseline 9/56    Time 8    Period Weeks    Status New    Target Date 03/18/21      PT LONG TERM GOAL #3   Title Pt will complete TUG with LRAD in at least 12 seconds and no LOB to demosntrate decreased falls risk and improved gait speed.    Time 8    Period Weeks    Target Date 03/18/21      PT LONG TERM GOAL #4   Title BLE at least 4+/5    Time 8    Period Weeks    Status New    Target Date 03/18/21      PT LONG TERM GOAL #5   Title Pt will be able to ambulate with LRAD or independent without LOB in both indoor and outdoor environments with </= 2/10 back/knee pain    Time 8    Period Weeks    Target Date 03/18/21                  Plan - 01/12/21 1417    Clinical Impression Statement Pt is a 55 yo female referred for right knee pain and right low back pain with sciatica. She reports that about 2 months ago she began to notice  it has been harder to walk  and pain (back into RLE with tingling) was increasing, in addition to worsening instability with multiple falls. Previously walking without AD or assistance and now has alot of difficulty and pain with moving around which warranted her to order a rollator, cane and shower chair which she has been using the past week. PMH significant for past R knee surgery, HTN, Lupus, anxiety, and left eye blindness. She currently presents with gross uncoordinated ataxic like quality of movement in BUE/LE, general mms weakness (LLE<RLE), and deconditioning. She scores as a high falls risk on the Berg, scoring 9/56 points. As a result she also has very unsteady gait and reports difficulty with stairs which she must negotiate to get into and around the home. She will benefit from skilled physical therapy to work on decreasing pain and improve upon functional strength, balance, and mobility.    Examination-Activity Limitations Locomotion Level;Transfers;Carry;Bend;Stand;Lift;Stairs    Stability/Clinical Decision Making  Unstable/Unpredictable    Clinical Decision Making Moderate    Rehab Potential Fair    PT Frequency 2x / week    PT Duration 8 weeks    PT Treatment/Interventions ADLs/Self Care Home Management;Cryotherapy;Electrical Stimulation;Moist Heat;DME Instruction;Gait training;Neuromuscular re-education;Therapeutic exercise;Therapeutic activities;Functional mobility training;Patient/family education;Orthotic Fit/Training;Manual techniques;Energy conservation;Dry needling;Vestibular;Visual/perceptual remediation/compensation;Taping    PT Next Visit Plan Reassess HEP. Assess stairs next visit.  Further screen neuro. Reinforce safe AD use and sequencing - alot of cueing needed for safety with rollator. May benefit from training with other AD. Progress strength, endurance, balance training gradually as tolerated. Manual and modalities as needed.    PT Home Exercise Plan see pt edu.    Recommended Other Services Pt may benefit from Neurologist consult given worsening quality of movement    Consulted and Agree with Plan of Care Patient           Patient will benefit from skilled therapeutic intervention in order to improve the following deficits and impairments:  Abnormal gait,Decreased coordination,Difficulty walking,Pain,Decreased endurance,Decreased balance,Decreased mobility,Decreased strength,Impaired sensation  Visit Diagnosis: Repeated falls - Plan: PT plan of care cert/re-cert  Muscle weakness (generalized) - Plan: PT plan of care cert/re-cert  Unsteadiness on feet - Plan: PT plan of care cert/re-cert  Right-sided low back pain with right-sided sciatica, unspecified chronicity - Plan: PT plan of care cert/re-cert  Right knee pain, unspecified chronicity - Plan: PT plan of care cert/re-cert     Problem List Patient Active Problem List   Diagnosis Date Noted  . Essential hypertension 09/14/2018  . History of lupus 09/14/2018  . Chronic pain of right knee 09/14/2018  . History of tear of  ACL (anterior cruciate ligament) 09/14/2018    Anson Crofts, PT, DPT 01/12/2021, 5:42 PM  [facility] [address] [place], [place], [address] Phone: [phone]   Fax:  [phone]  Name: Chrissie Dacquisto MRN: [id number] Date of Birth: [birth year]

## 2021-01-14 ENCOUNTER — Ambulatory Visit: Payer: Medicare HMO | Admitting: Orthopedic Surgery

## 2021-01-28 ENCOUNTER — Ambulatory Visit (INDEPENDENT_AMBULATORY_CARE_PROVIDER_SITE_OTHER): Payer: Medicare HMO | Admitting: Orthopedic Surgery

## 2021-01-28 ENCOUNTER — Ambulatory Visit: Payer: Medicare HMO | Attending: Surgical

## 2021-01-28 DIAGNOSIS — Z7409 Other reduced mobility: Secondary | ICD-10-CM | POA: Diagnosis not present

## 2021-01-28 MED ORDER — TRAMADOL HCL 50 MG PO TABS: 50.0000 mg | ORAL_TABLET | Freq: Two times a day (BID) | ORAL | 0 refills | Status: DC | PRN

## 2021-01-29 LAB — COMPREHENSIVE METABOLIC PANEL
AG Ratio: 1.3 (calc) (ref 1.0–2.5)
ALT: 10 U/L (ref 6–29)
AST: 15 U/L (ref 10–35)
Albumin: 4.2 g/dL (ref 3.6–5.1)
Alkaline phosphatase (APISO): 72 U/L (ref 37–153)
BUN: 11 mg/dL (ref 7–25)
CO2: 25 mmol/L (ref 20–32)
Calcium: 9.2 mg/dL (ref 8.6–10.4)
Chloride: 106 mmol/L (ref 98–110)
Creat: 0.69 mg/dL (ref 0.50–1.05)
Globulin: 3.3 g/dL (calc) (ref 1.9–3.7)
Glucose, Bld: 82 mg/dL (ref 65–99)
Potassium: 4.2 mmol/L (ref 3.5–5.3)
Sodium: 138 mmol/L (ref 135–146)
Total Bilirubin: 0.5 mg/dL (ref 0.2–1.2)
Total Protein: 7.5 g/dL (ref 6.1–8.1)

## 2021-01-29 LAB — CBC WITH DIFFERENTIAL/PLATELET
Absolute Monocytes: 488 cells/uL (ref 200–950)
Basophils Absolute: 28 cells/uL (ref 0–200)
Basophils Relative: 0.6 %
Eosinophils Absolute: 78 cells/uL (ref 15–500)
Eosinophils Relative: 1.7 %
HCT: 40.3 % (ref 35.0–45.0)
Hemoglobin: 12.4 g/dL (ref 11.7–15.5)
Lymphs Abs: 1739 cells/uL (ref 850–3900)
MCH: 26.2 pg — ABNORMAL LOW (ref 27.0–33.0)
MCHC: 30.8 g/dL — ABNORMAL LOW (ref 32.0–36.0)
MCV: 85 fL (ref 80.0–100.0)
MPV: 10.9 fL (ref 7.5–12.5)
Monocytes Relative: 10.6 %
Neutro Abs: 2268 cells/uL (ref 1500–7800)
Neutrophils Relative %: 49.3 %
Platelets: 223 10*3/uL (ref 140–400)
RBC: 4.74 10*6/uL (ref 3.80–5.10)
RDW: 13.4 % (ref 11.0–15.0)
Total Lymphocyte: 37.8 %
WBC: 4.6 10*3/uL (ref 3.8–10.8)

## 2021-01-30 ENCOUNTER — Encounter: Payer: Self-pay | Admitting: Orthopedic Surgery

## 2021-01-30 NOTE — Progress Notes (Signed)
Office Visit Note   Patient: Dana Turner           Date of Birth: [birth year]           MRN: [id number] Visit Date: 01/28/2021 Requested by: Georgina Quint, MD [address] [place],  [place] [address] PCP: Georgina Quint, MD  Subjective: Chief Complaint  Patient presents with  . Other     Scan review    HPI: Dana Turner is a 55 year old patient here for review of MRI scan of her lumbar spine.  Physical therapy has noticed that she is having progressive difficulty with ambulation.  Before she was having right-sided radicular symptoms and weakness but now it is affecting both legs.  Started 1 month ago.  2 to 3 months ago she was walking around in her cul-de-sac and now she is on a walker.  She has a history of right knee ACL and meniscus surgery.  Denies any headache and no upper extremity symptoms other than some new numbness in both arms and hands.  She has very little mid back pain.  No family history of this type of progressive weakness.  She is taking some over-the-counter medication but describes this as a fairly progressive issue recently.              ROS: All systems reviewed are negative as they relate to the chief complaint within the history of present illness.  Patient denies  fevers or chills.   Assessment & Plan: Visit Diagnoses:  1. Declining mobility     Plan: Impression is relatively normal lumbar spine MRI which does not explain either right or left-sided symptoms.  Cavus feet not present.  Appears to have some upper extremity adduction and and AB duction weakness.  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  No vision changes but MS is another consideration particularly with her history of autoimmune issues.  Plan is complete metabolic panel which is normal at the time of this dictation.  Refill Ultram.  MRI brain to evaluate for some type of lesion which could be causing this.  Neurology referral also for nerve conduction study and evaluation of what appears to be  a progressive neurologic process affecting her gait.  AML is another consideration but the course seems rather rapid for that.    Follow-Up Instructions: Return for after MRI.   Orders:  Orders Placed This Encounter  Procedures  . MR BRAIN WO CONTRAST  . Comprehensive Metabolic Panel (CMET)  . CBC with Differential  . Ambulatory referral to Neurology   Meds ordered this encounter  Medications  . traMADol (ULTRAM) 50 MG tablet    Sig: Take 1 tablet (50 mg total) by mouth every 12 (twelve) hours as needed.    Dispense:  20 tablet    Refill:  0      Procedures: No procedures performed   Clinical Data: No additional findings.  Objective: Vital Signs: There were no vitals taken for this visit.  Physical Exam:   Constitutional: Patient appears well-developed HEENT:  Head: Normocephalic Eyes:EOM are normal Neck: Normal range of motion Cardiovascular: Normal rate Pulmonary/chest: Effort normal Neurologic: Patient is alert Skin: Skin is warm Psychiatric: Patient has normal mood and affect    Ortho Exam: Ortho exam demonstrates 1+ out of 4 reflexes bilateral patella and Achilles with no clonus.  Palpable pedal pulses.  She does have some eversion weakness but no cavus foot is present.  Has 5 months out of 5 leg extension hip abduction adduction and hip  flexion strength.  Bilateral EPL FPL strength intact but interosseous strength is weak in the hands with hint of interosseous wasting.  Biceps tricep strength intact.  Neck range of motion full.  Does have some paresthesias bilateral lower extremities as well as in the hands.  Negative Tinel's cubital tunnel bilaterally.  Specialty Comments:  No specialty comments available.  Imaging: No results found.   PMFS History: Patient Active Problem List   Diagnosis Date Noted  . Essential hypertension 09/14/2018  . History of lupus 09/14/2018  . Chronic pain of right knee 09/14/2018  . History of tear of ACL (anterior  cruciate ligament) 09/14/2018   Past Medical History:  Diagnosis Date  . Anxiety   . Depression   . Hypertension   . Legally blind in left eye, as defined in [place]   . Lupus (HCC)     History reviewed. No pertinent family history.  Past Surgical History:  Procedure Laterality Date  . ABDOMINAL HYSTERECTOMY  1996  . EYE SURGERY Left 2016   Social History   Occupational History  . Not on file  Tobacco Use  . Smoking status: Current Every Day Smoker    Packs/day: 0.50    Years: 10.00    Pack years: 5.00    Types: Cigarettes  . Smokeless tobacco: Never Used  Substance and Sexual Activity  . Alcohol use: Yes    Alcohol/week: 1.0 standard drink    Types: 1 Glasses of wine per week  . Drug use: Never  . Sexual activity: Not on file

## 2021-02-03 ENCOUNTER — Ambulatory Visit: Payer: Medicare HMO

## 2021-02-03 ENCOUNTER — Encounter: Payer: Self-pay | Admitting: Neurology

## 2021-02-05 ENCOUNTER — Ambulatory Visit: Payer: Medicare HMO

## 2021-02-06 ENCOUNTER — Other Ambulatory Visit: Payer: Self-pay

## 2021-02-06 ENCOUNTER — Ambulatory Visit
Admission: RE | Admit: 2021-02-06 | Discharge: 2021-02-06 | Disposition: A | Discharge status: Home / Self Care | Payer: Medicare HMO | Source: Ambulatory Visit | Attending: Orthopedic Surgery | Admitting: Orthopedic Surgery

## 2021-02-06 DIAGNOSIS — Z7409 Other reduced mobility: Secondary | ICD-10-CM

## 2021-02-06 IMAGING — MR MR HEAD W/O CM
11 series · 48 of 48 positions shown · non-contrast
Comparison: None.

CLINICAL DATA: Rapidly declining mobility

EXAM:
MRI HEAD WITHOUT CONTRAST
TECHNIQUE: Multiplanar, multiecho pulse sequences of the brain and surrounding
structures were obtained without intravenous contrast.

[Series 2: T1 · sagittal · 5.0mm · 0.45mm/px · 2 of 21 slices shown]
[im 1/21]
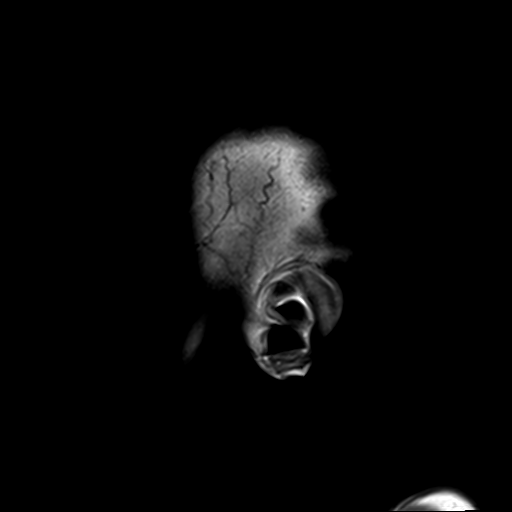
[im 21/21]
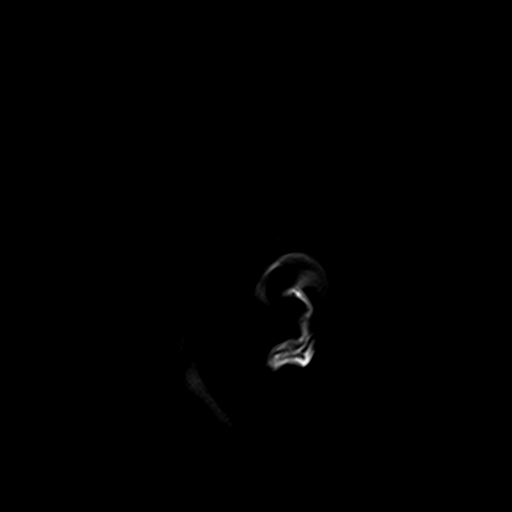

[Series 3: DWI · axial · 3.0mm · 1.80mm/px · z∈[-75,+57]mm · 9 of 100 slices shown (1 of 4)]
[im 1/100]
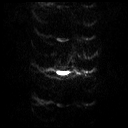
[im 13/100]
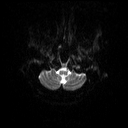
[im 25/100]
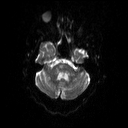
[im 38/100]
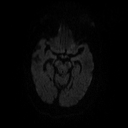
[im 50/100]
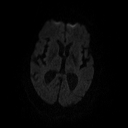
[im 62/100]
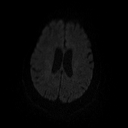
[im 75/100]
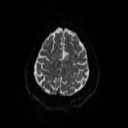
[im 87/100]
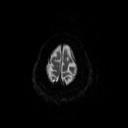
[im 100/100]
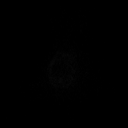

[Series 4: DWI · axial · 3.0mm · 1.80mm/px · z∈[-72,+57]mm · 4 of 49 slices shown (2 of 4)]
[im 1/49]
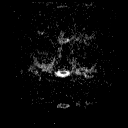
[im 17/49]
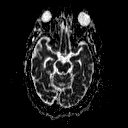
[im 33/49]
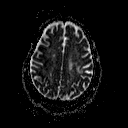
[im 49/49]
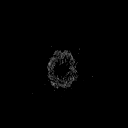

[Series 5: DWI · coronal · 5.0mm · 1.80mm/px · 6 of 68 slices shown (3 of 4)]
[im 1/68]
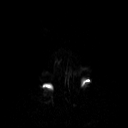
[im 14/68]
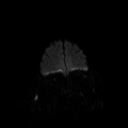
[im 27/68]
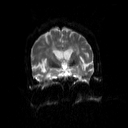
[im 41/68]
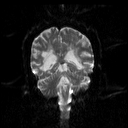
[im 54/68]
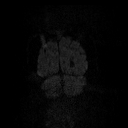
[im 68/68]
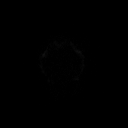

[Series 6: DWI · coronal · 5.0mm · 1.80mm/px · 3 of 35 slices shown (4 of 4)]
[im 1/35]
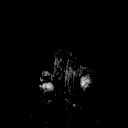
[im 18/35]
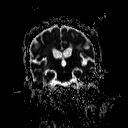
[im 35/35]
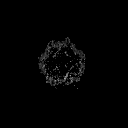

[Series 7: T2 · axial · 5.0mm · 0.51mm/px · z∈[-69,+68]mm · 2 of 22 slices shown (1 of 2)]
[im 1/22]
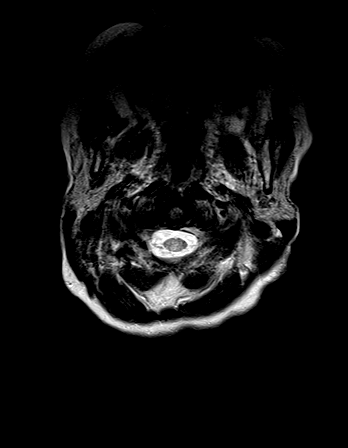
[im 22/22]
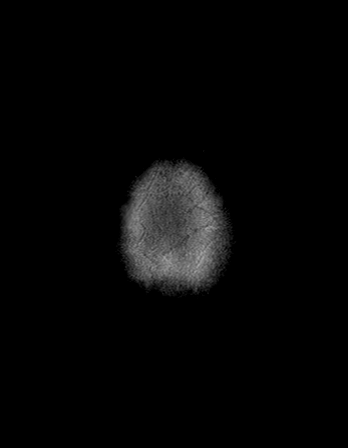

[Series 8: FLAIR · axial · 3.0mm · 0.45mm/px · z∈[-64,+62]mm · 3 of 30 slices shown (1 of 2)]
[im 1/30]
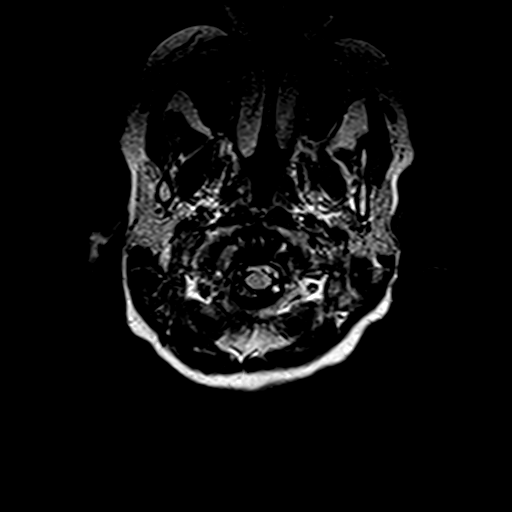
[im 15/30]
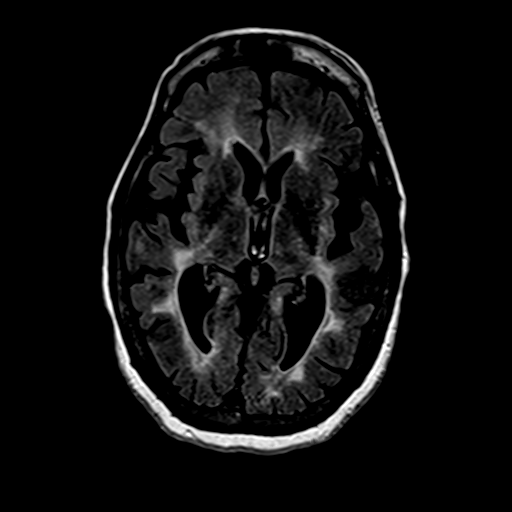
[im 30/30]
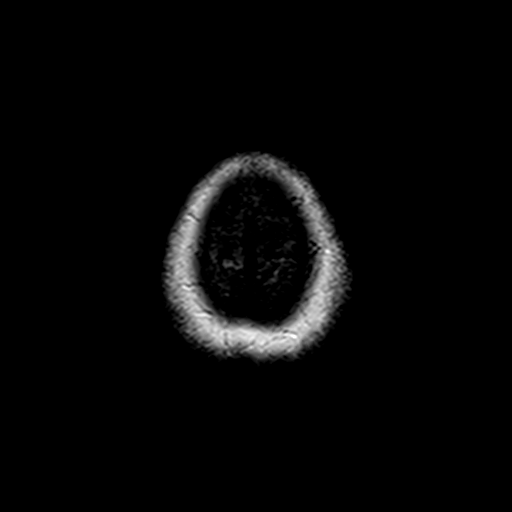

[Series 10: swi_images · axial · 4.0mm · 0.90mm/px · z∈[-66,+64]mm · 3 of 36 slices shown]
[im 1/36]
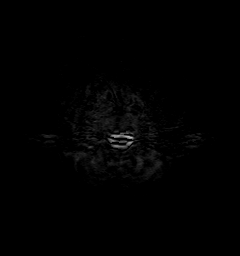
[im 18/36]
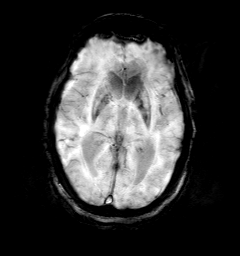
[im 36/36]
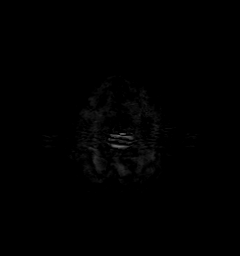

[Series 11: t1_mpr_tra · axial · 1.0mm · 0.71mm/px · z∈[-66,+67]mm · 12 of 144 slices shown]
[im 1/144]
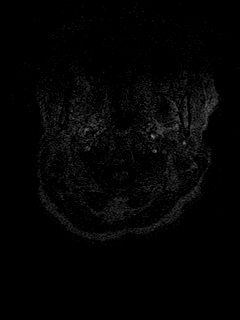
[im 14/144]
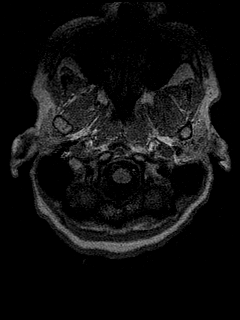
[im 27/144]
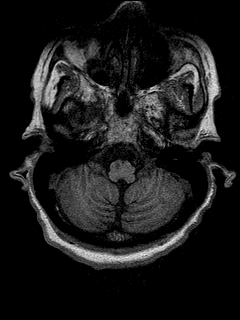
[im 40/144]
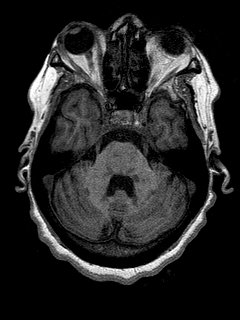
[im 53/144]
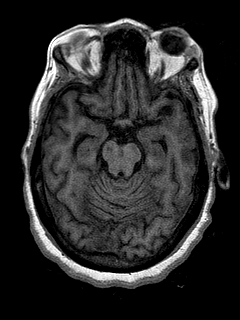
[im 66/144]
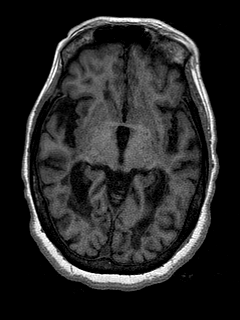
[im 79/144]
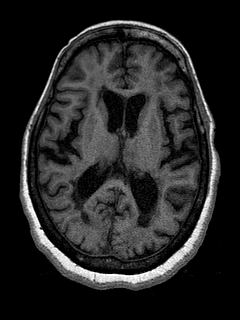
[im 92/144]
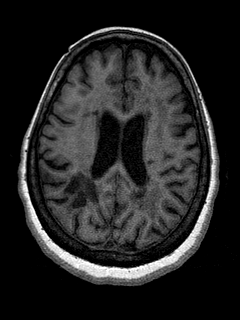
[im 105/144]
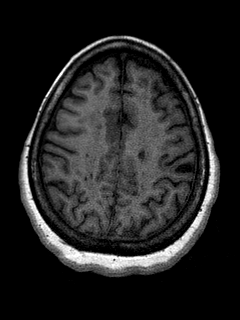
[im 118/144]
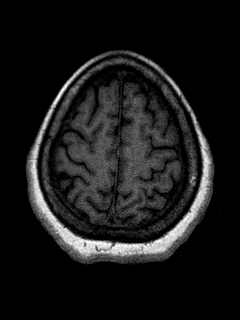
[im 131/144]
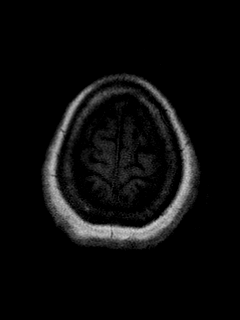
[im 144/144]
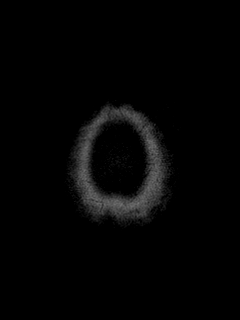

[Series 12: T2 · coronal · 5.0mm · 0.45mm/px · 2 of 25 slices shown (2 of 2)]
[im 1/25]
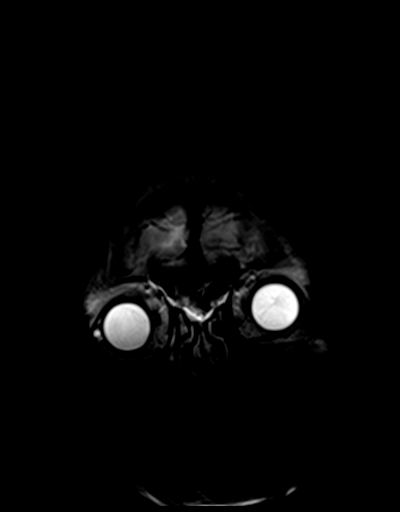
[im 25/25]
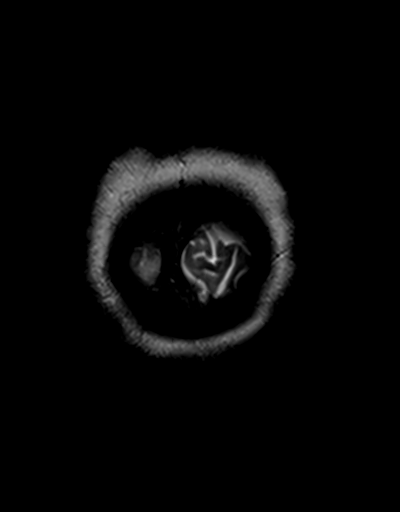

[Series 13: FLAIR · sagittal · 5.0mm · 0.45mm/px · 2 of 25 slices shown (2 of 2)]
[im 1/25]
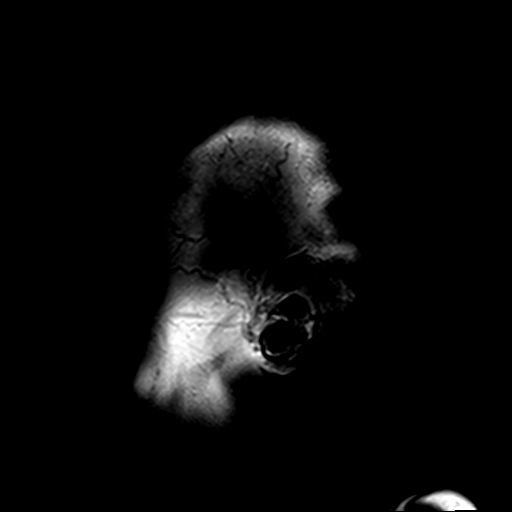
[im 25/25]
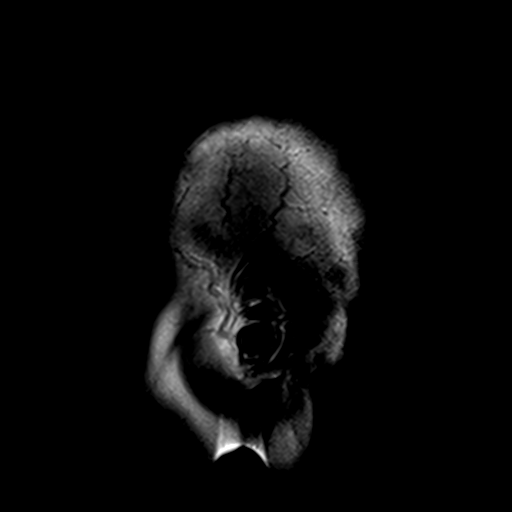

[48 of 48 positions shown; findings below may reference images not displayed]

FINDINGS: Brain: There are confluent areas of T2 hyperintensity in the
supratentorial periventricular and subcortical white matter. There
is involvement of the callososeptal interface. Many of the T2
hyperintense foci are oriented orthogonal to the lateral ventricles.
Foci of T2 hyperintensity are also present within the brainstem and
cerebellum bilaterally. Prominence of the ventricles and sulci
reflects parenchymal volume loss.

No evidence of recent infarction or hemorrhage. There is no
intracranial mass or mass effect. There is no hydrocephalus or
extra-axial fluid collection.

Vascular: Major vessel flow voids at the skull base are preserved.

Skull and upper cervical spine: Normal marrow signal is preserved.

Sinuses/Orbits: Paranasal sinuses are aerated. Orbits are
unremarkable.

Other: Sella is unremarkable.  Mastoid air cells are clear.
IMPRESSION: Advanced white matter disease involving cerebrum, brainstem, and
cerebellum with parenchymal volume loss. Appearance is suspicious
for primary demyelinating disease such as multiple sclerosis. There
also may be a component of chronic microvascular ischemic change in
the setting of reported lupus.

## 2021-02-06 NOTE — Progress Notes (Signed)
Pls rf to neurology asap thx

## 2021-02-06 NOTE — Progress Notes (Signed)
I called and discussed results with her

## 2021-02-09 NOTE — Progress Notes (Signed)
NEUROLOGY CONSULTATION NOTE  Dana Turner MRN: [id number] DOB: 08-01-1966  Referring provider: Georgina Quint, MD Primary care provider: Georgina Quint, MD  Reason for consult:  declining mobility  Assessment/Plan:    Concern for multiple sclerosis - MRI of brain does show abnormal white matter disease which may be chronic small vessel disease related to hypertension and lupus, however she also exhibits signs of myelopathy on exam suggestive of spinal cord involvement. Hypertension - elevated today.  She says this is common and she didn't take her medication this morning.  1.MRI of brain with contrast and MRI cervical and thoracic spine with and without contrast 2.Check TSH and B12 3.Follow up immediately after testing to discuss results and plan going forward. 4.Advised to contact PCP's office regarding blood pressure.   Subjective:  Dana Turner is a 55 year old rig-handed female with lupus, fibromyalgia, HTN, legally blind in left eye, and depression/anxiety who presents for declining mobility.  History supplemented by orthopedic notes.  Over the past year, she reports recurrent falls.  She attributed it to right knee pain with instability.  She has prior history of MCL reconstruction.  She also has been experiencing low back pain with radicular pain down the anterior right leg.  This progressed to weakness in the left leg as well.  She has generalized pain.  She saw orthopedics.  MRI of right knee on 01/09/2021 showed mild degeneration of the ACL and prior MCL reconstruction with intact graft but no internal derangement or other significant pathology.  MRI of lumbar spine performed same day personally reviewed showed mild multilevel lumbar spondylosis with mild left neuroforaminal stenosis at L3-4 and L4-5 but otherwise unremarkable. She then had MRI of the brain without contrast on 02/06/2021 which was personally reviewed and showed advanced white matter disease  involving the cerebral hemispheres, brainstem and cerebellum.     She was diagnosed with lupus several years ago while living in [place].  She presented with various symptoms such as numbness, pain and balance problems.  She reportedly had an MRI performed there, but does not remember the results.  She is on Plaquenil.  Symptoms improved and she had been doing well up until this past year.  Reports some memory problems.  She is legally blind in her left eye.  History of   Denies family history of multiple sclerosis.  Her sister has other autoimmune diseases as well.    PAST MEDICAL HISTORY: Past Medical History:  Diagnosis Date   Anxiety    Depression    Hypertension    Legally blind in left eye, as defined in [place]    Lupus (HCC)     PAST SURGICAL HISTORY: Past Surgical History:  Procedure Laterality Date   ABDOMINAL HYSTERECTOMY  1996   EYE SURGERY Left 2016    MEDICATIONS: Current Outpatient Medications on File Prior to Visit  Medication Sig Dispense Refill   ALPRAZolam (XANAX) 1 MG tablet Take 1 mg by mouth 3 (three) times daily as needed for anxiety.     amLODipine (NORVASC) 10 MG tablet Take 1 tablet (10 mg total) by mouth daily. 90 tablet 3   hydroxychloroquine (PLAQUENIL) 200 MG tablet Take 200 mg by mouth 2 (two) times daily.     sertraline (ZOLOFT) 25 MG tablet      traMADol (ULTRAM) 50 MG tablet Take 1 tablet (50 mg total) by mouth every 12 (twelve) hours as needed. 20 tablet 0   No current facility-administered medications on file prior  to visit.    ALLERGIES: No Known Allergies  FAMILY HISTORY: No family history on file.  Objective:  Blood pressure (!) 201/124, pulse 78, height 5\' 3"  (1.6 m), weight 126 lb (57.2 kg), SpO2 98 %. General: No acute distress.  Patient appears well-groomed.   Head:  Normocephalic/atraumatic Eyes:  Proptosis of both eyes; fundi examined but not visualized Neck: supple, no paraspinal tenderness, full range of motion Back: No  paraspinal tenderness Heart: regular rate and rhythm Lungs: Clear to auscultation bilaterally. Vascular: No carotid bruits. Neurological Exam: Mental status: alert and oriented to person, place, and time, recent and remote memory intact, fund of knowledge intact, attention and concentration intact, speech fluent and not dysarthric, language intact. Cranial nerves: CN I: not tested CN II: pupils equal, round and reactive to light, vision loss left eye CN III, IV, VI:  Left eye abducted on primary gaze.  Reduced tracking of both eyes to the right, no nystagmus, no ptosis CN V: facial sensation intact. CN VII: upper and lower face symmetric CN VIII: hearing intact CN IX, X: gag intact, uvula midline CN XI: sternocleidomastoid and trapezius muscles intact CN XII: tongue midline Bulk & Tone: generalized atrophy, no fasciculations. Motor:  muscle strength 4+/5 bilateral triceps, 5-/5 bilateral hand grip, 4+/5 right hip flexion, 2+/5 left hip flexion, 4+/5 bilateral knee extension/flexion, bilateral foot drop; otherwise, 5/5 throughout Sensation:  Pinprick, temperature and vibratory sensation intact. Deep Tendon Reflexes:  3+ throughout,  bilateral Babinski Finger to nose testing:  Without dysmetria.   Heel to shin:  With mild dysmetria bilaterally Gait:  Ambulates with walker.  Romberg positive  Thank you for allowing me to take part in the care of this patient.  , DO  CC:  Shon Millet, MD

## 2021-02-10 ENCOUNTER — Encounter: Payer: Self-pay | Admitting: Neurology

## 2021-02-10 ENCOUNTER — Ambulatory Visit (INDEPENDENT_AMBULATORY_CARE_PROVIDER_SITE_OTHER): Payer: Medicare HMO | Admitting: Neurology

## 2021-02-10 ENCOUNTER — Other Ambulatory Visit: Payer: Self-pay

## 2021-02-10 ENCOUNTER — Ambulatory Visit: Payer: Medicare HMO

## 2021-02-10 VITALS — BP 201/124 | HR 78 | Ht 63.0 in | Wt 126.0 lb

## 2021-02-10 DIAGNOSIS — R9082 White matter disease, unspecified: Secondary | ICD-10-CM | POA: Diagnosis not present

## 2021-02-10 DIAGNOSIS — R292 Abnormal reflex: Secondary | ICD-10-CM

## 2021-02-10 NOTE — Patient Instructions (Addendum)
MRI of cervical and thoracic spine with and without contrast. We have sent a referral to [facility] for your MRI and they will call you directly to schedule your appointment. They are located at [address] [facility]. If you need to contact them directly please call [phone].  Check TSH, B12. Your provider has requested that you have labwork completed today. Please go to [facility] ([id number]) on the second floor of this building before leaving the office today. You do not need to check in. If you are not called within 15 minutes please check with the front desk.   Follow up after testing.

## 2021-02-12 ENCOUNTER — Ambulatory Visit: Payer: Medicare HMO

## 2021-02-17 ENCOUNTER — Ambulatory Visit: Payer: Medicare HMO

## 2021-02-19 ENCOUNTER — Ambulatory Visit: Payer: Medicare HMO

## 2021-02-24 ENCOUNTER — Ambulatory Visit: Payer: Medicare HMO

## 2021-02-26 ENCOUNTER — Ambulatory Visit: Payer: Medicare HMO | Admitting: Physical Therapy

## 2021-02-26 ENCOUNTER — Ambulatory Visit: Payer: Medicare HMO | Admitting: Neurology

## 2021-03-03 ENCOUNTER — Other Ambulatory Visit: Payer: Medicare HMO

## 2021-03-05 ENCOUNTER — Inpatient Hospital Stay: Admission: RE | Admit: 2021-03-05 | Payer: Medicare HMO | Source: Ambulatory Visit

## 2021-03-11 ENCOUNTER — Other Ambulatory Visit: Payer: Self-pay

## 2021-03-11 ENCOUNTER — Ambulatory Visit
Admission: RE | Admit: 2021-03-11 | Discharge: 2021-03-11 | Disposition: A | Discharge status: Home / Self Care | Payer: Medicare HMO | Source: Ambulatory Visit | Attending: Neurology | Admitting: Neurology

## 2021-03-11 ENCOUNTER — Other Ambulatory Visit: Payer: Medicare HMO

## 2021-03-11 DIAGNOSIS — R292 Abnormal reflex: Secondary | ICD-10-CM

## 2021-03-11 DIAGNOSIS — R9082 White matter disease, unspecified: Secondary | ICD-10-CM

## 2021-03-11 IMAGING — MR MR CERVICAL SPINE WO/W CM
4 of 8 series · 23 of 48 positions shown · IV contrast (multihance)
Comparison: Lumbar MRI [DATE] and brain MRI [DATE]

CLINICAL DATA: White matter abnormality on brain MRI. Numbness and
weakness in both legs

EXAM:
MRI CERVICAL AND THORACIC SPINE WITHOUT AND WITH CONTRAST
TECHNIQUE: Multiplanar and multiecho pulse sequences of the cervical spine, to
include the craniocervical junction and cervicothoracic junction,
and the thoracic spine, were obtained without and with intravenous
contrast.
CONTRAST:  12mL MULTIHANCE GADOBENATE DIMEGLUMINE 529 MG/ML IV SOLN

[Series 3: T1 · sagittal · 3.0mm · 0.41mm/px · 5 of 16 slices shown (1 of 2)]
[im 1/16]
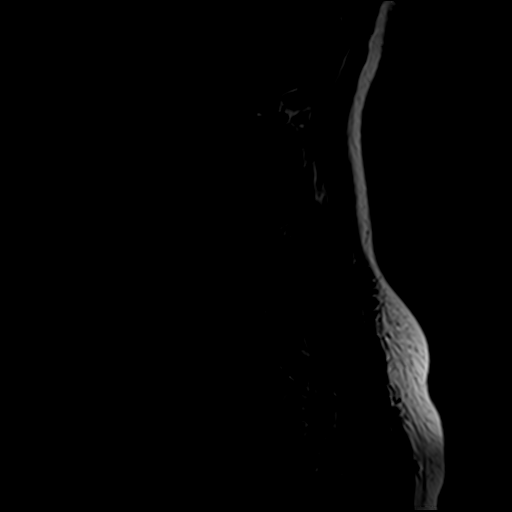
[im 4/16]
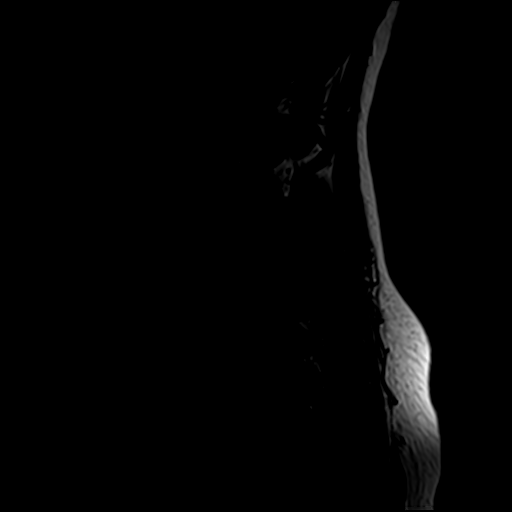
[im 8/16]
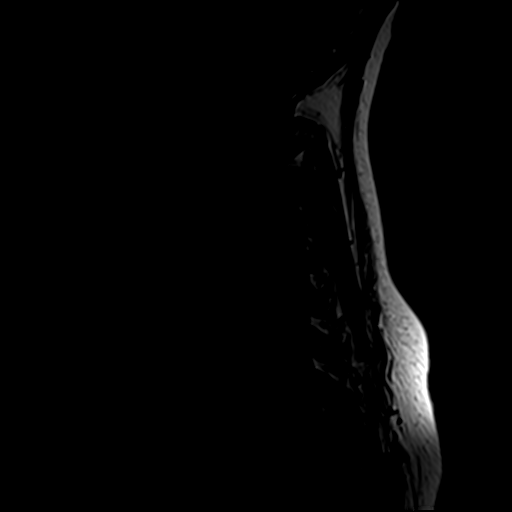
[im 12/16]
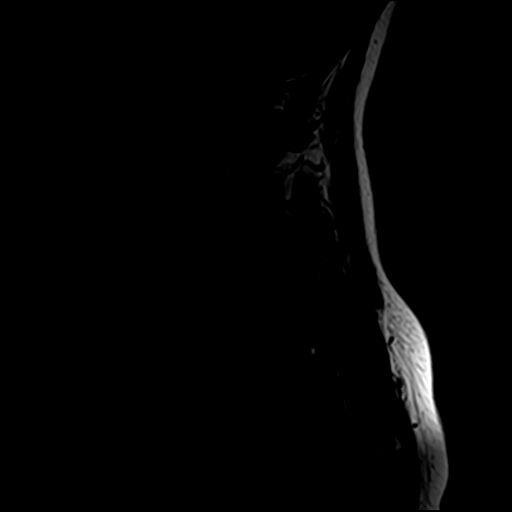
[im 16/16]
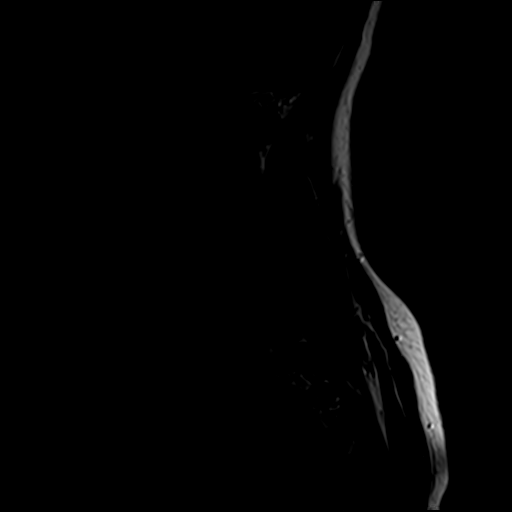

[Series 4: T2 · sagittal · 3.0mm · 0.41mm/px · 5 of 16 slices shown (1 of 2)]
[im 1/16]
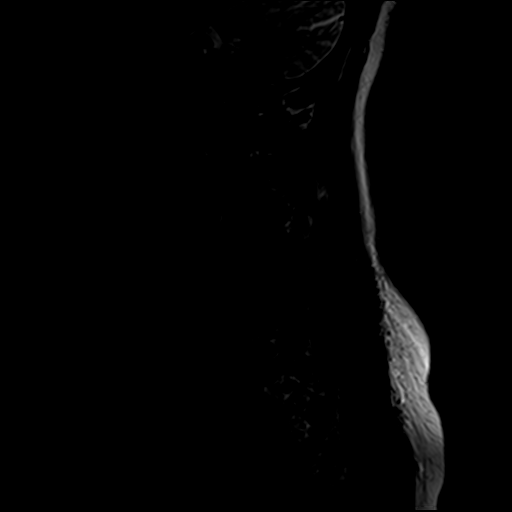
[im 4/16]
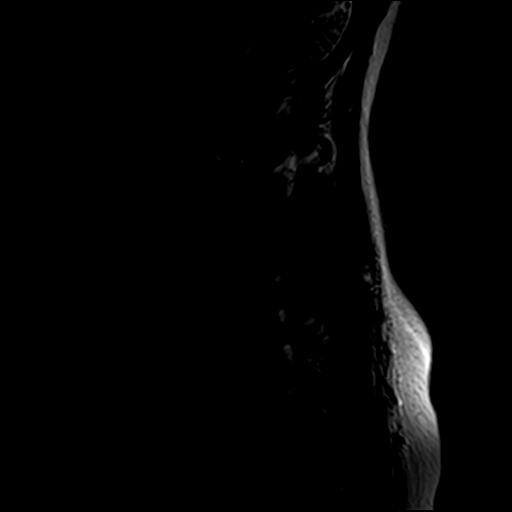
[im 8/16]
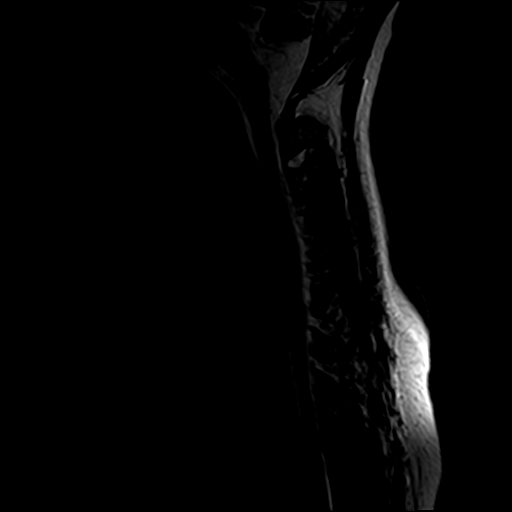
[im 12/16]
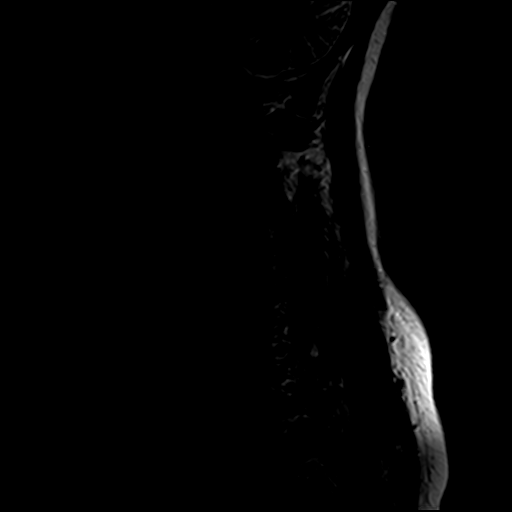
[im 16/16]
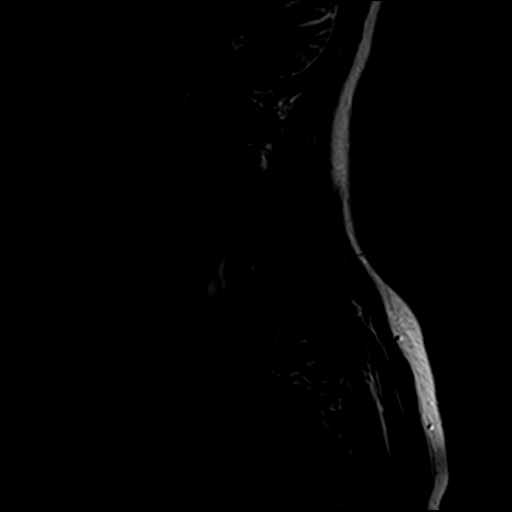

[Series 6: T2 · axial · 3.0mm · 0.70mm/px · z∈[-43,+43]mm · 7 of 25 slices shown (2 of 2)]
[im 1/25]
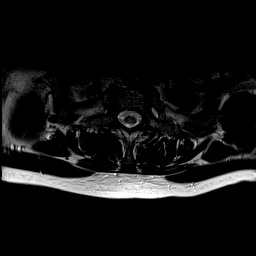
[im 5/25]
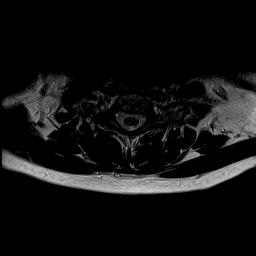
[im 9/25]
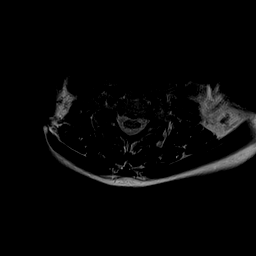
[im 13/25]
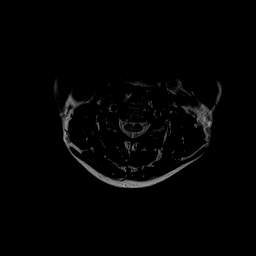
[im 17/25]
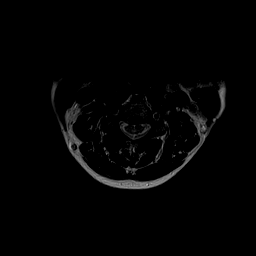
[im 21/25]
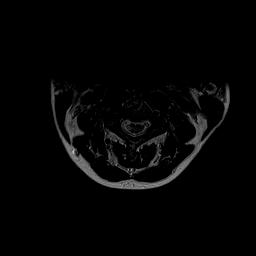
[im 25/25]
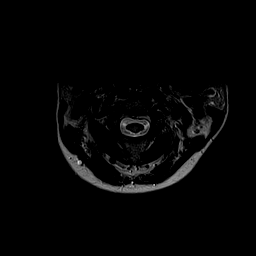

[Series 7: T1 · axial · 3.0mm · 0.35mm/px · z∈[-43,+29]mm · 6 of 25 slices shown (2 of 2)]
[im 1/25]
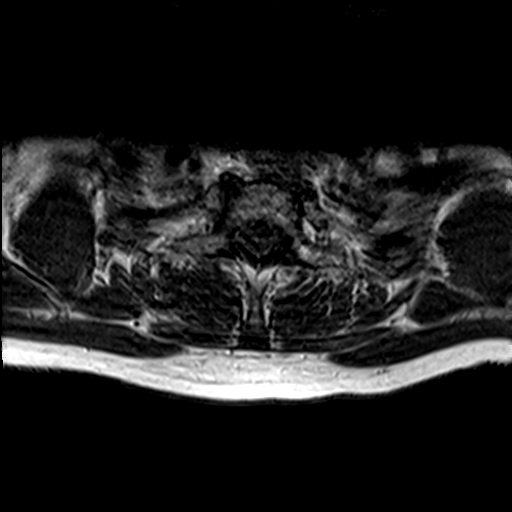
[im 5/25]
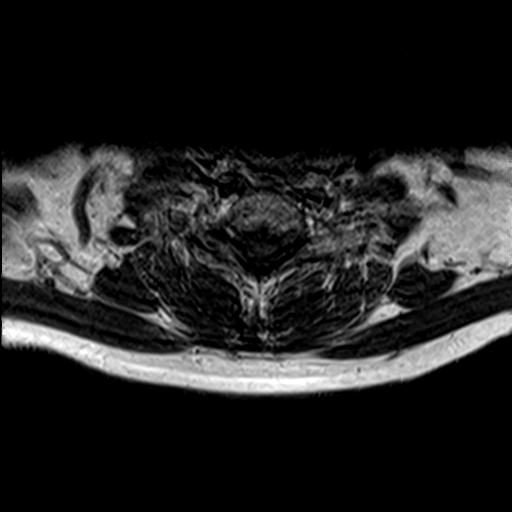
[im 9/25]
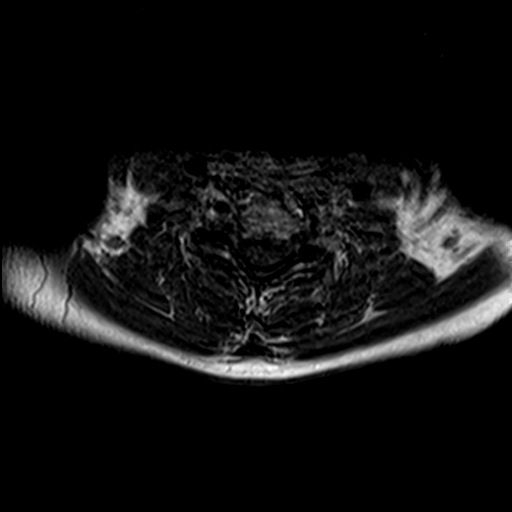
[im 13/25]
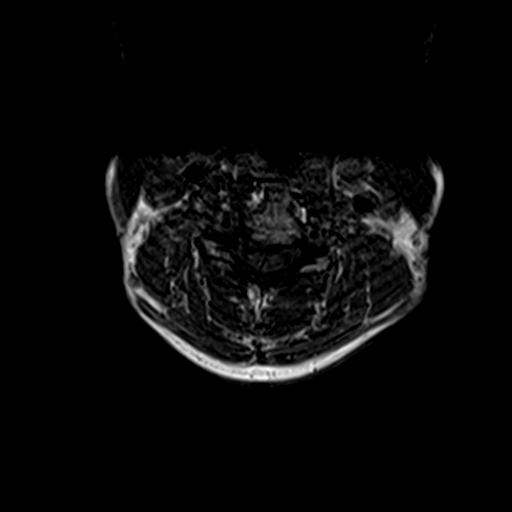
[im 17/25]
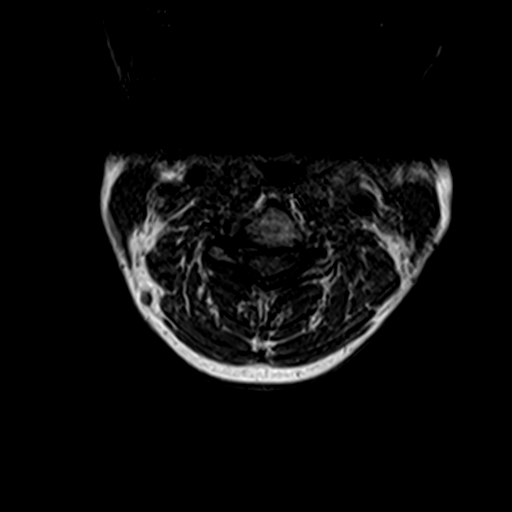
[im 21/25]
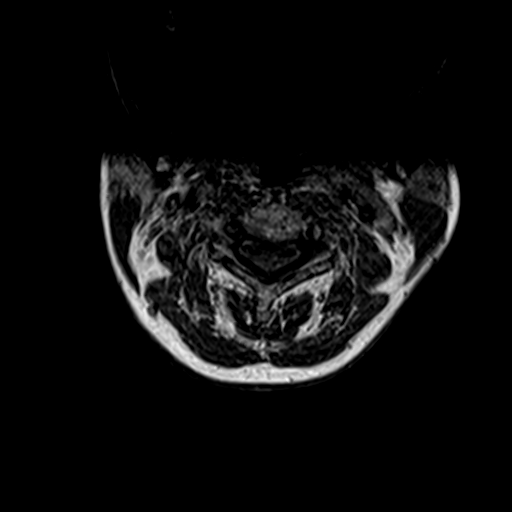

[23 of 48 positions shown; findings below may reference images not displayed]

FINDINGS: MRI CERVICAL SPINE FINDINGS

Alignment: Reversal of cervical lordosis which is at least partially
positional. Slight anterolisthesis at C3-4.

Vertebrae: No fracture, evidence of discitis, or bone lesion.

Cord: Heterogeneous cord signal due to patchy short segment T2
hyperintensities throughout essentially the entire cord, best seen
on sagittal STIR imaging. The dominant plaques are in the right cord
at C2-3, central cord at C4-5, central to right cord at C5-6, and
posterior cord at the cervicothoracic junction. No cord enhancement.
Preserved cord volume for the degree of involvement.

Paraspinal and other soft tissues: No evidence of mass or
inflammation.

Disc levels:

Asymmetric right facet spurring at C3-4.

C4-5 disc degeneration with bulging and right uncovertebral
spurring. Right foraminal impingement.

MRI THORACIC SPINE FINDINGS

Alignment:  Normal

Vertebrae: No fracture, evidence of discitis, or bone lesion.

Cord: Diffuse cord heterogeneity from short segment plaques which
are difficult to discretely resolve given the extent. No enhancement
or cord deformity.

Paraspinal and other soft tissues: Some periarticular enhancement is
seen on the right at T10-11, considered degenerative.

Disc levels:

Midthoracic disc narrowing and bulging. Negative facets. Diffusely
patent canal.
IMPRESSION: Widespread nonenhancing cord plaques in the cervical and thoracic
spine.

## 2021-03-11 IMAGING — MR MR THORACIC SPINE WO/W CM
4 of 9 series · 19 of 48 positions shown · IV contrast (multihance)
Comparison: Lumbar MRI [DATE] and brain MRI [DATE]

CLINICAL DATA: White matter abnormality on brain MRI. Numbness and
weakness in both legs

EXAM:
MRI CERVICAL AND THORACIC SPINE WITHOUT AND WITH CONTRAST
TECHNIQUE: Multiplanar and multiecho pulse sequences of the cervical spine, to
include the craniocervical junction and cervicothoracic junction,
and the thoracic spine, were obtained without and with intravenous
contrast.
CONTRAST:  12mL MULTIHANCE GADOBENATE DIMEGLUMINE 529 MG/ML IV SOLN

[Series 2: T1 · sagittal · 3.0mm · 1.06mm/px · 1 of 7 slices shown]
[im 1/7]
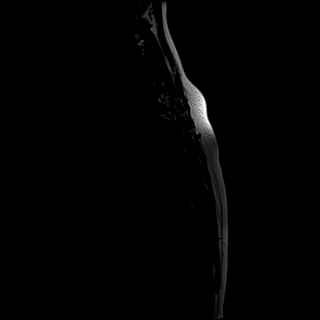

[Series 6: T2 · axial · 4.0mm · 0.39mm/px · z∈[-245,-54]mm · 8 of 34 slices shown (1 of 2)]
[im 1/34]
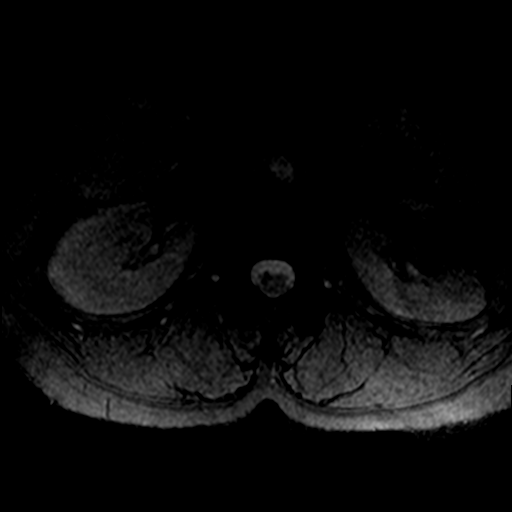
[im 5/34]
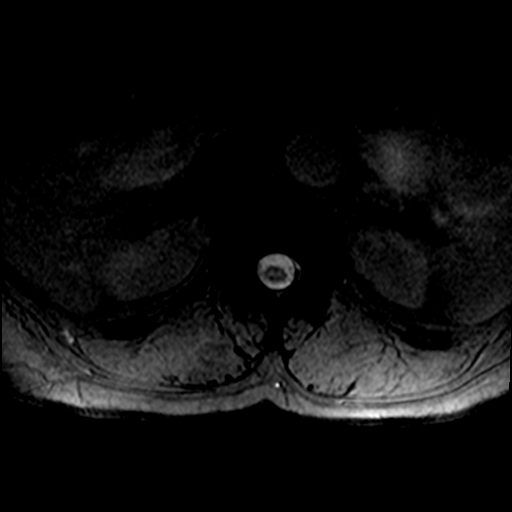
[im 10/34]
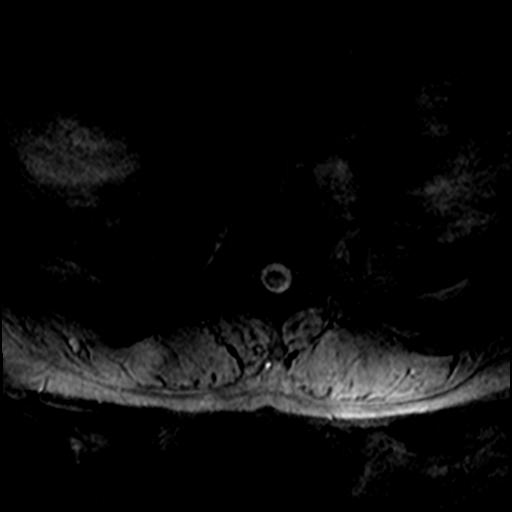
[im 15/34]
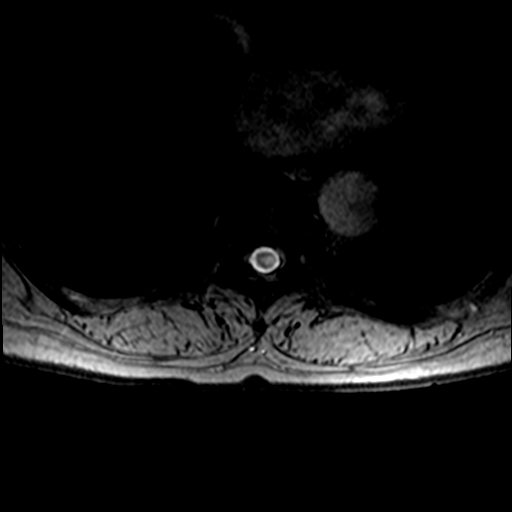
[im 19/34]
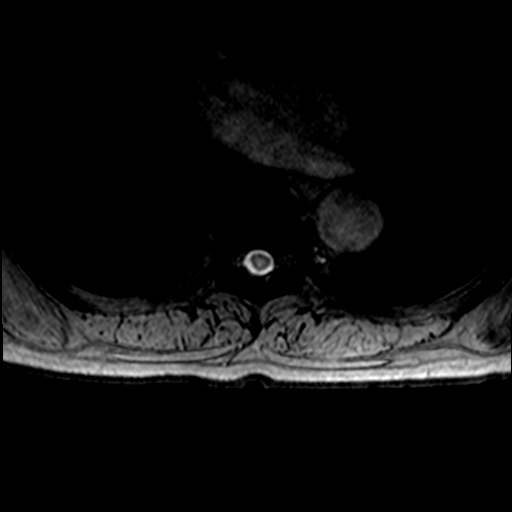
[im 24/34]
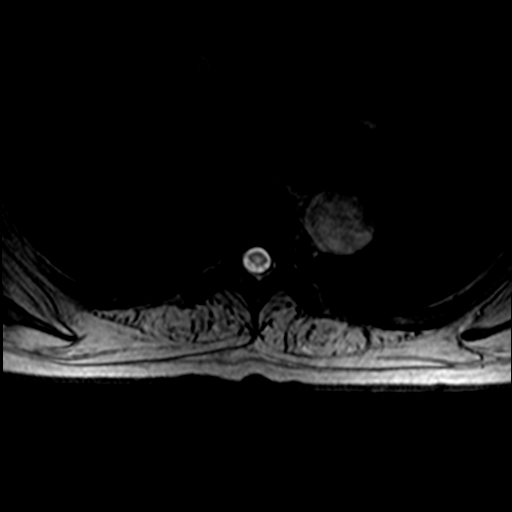
[im 29/34]
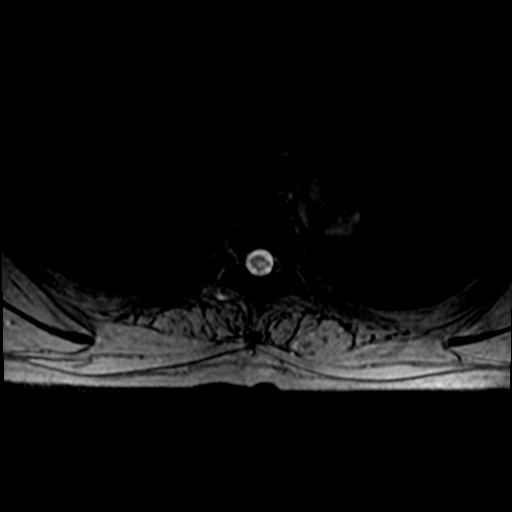
[im 34/34]
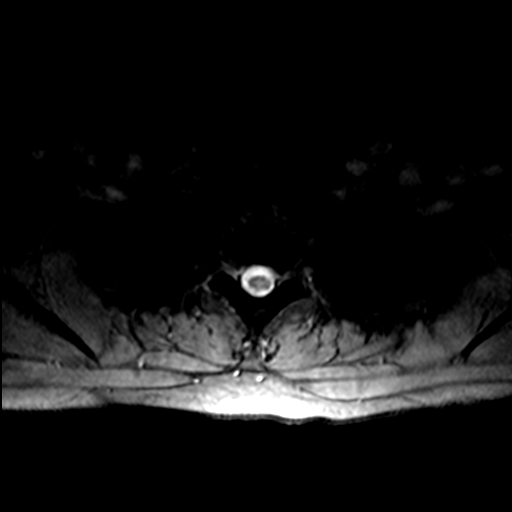

[Series 7: T2 · axial · 4.0mm · 0.39mm/px · z∈[-227,-65]mm · 7 of 37 slices shown (2 of 2)]
[im 1/37]
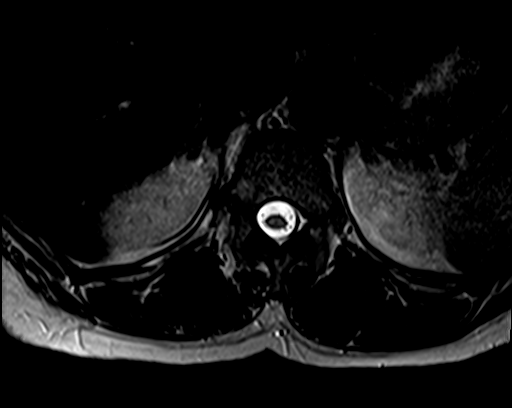
[im 6/37]
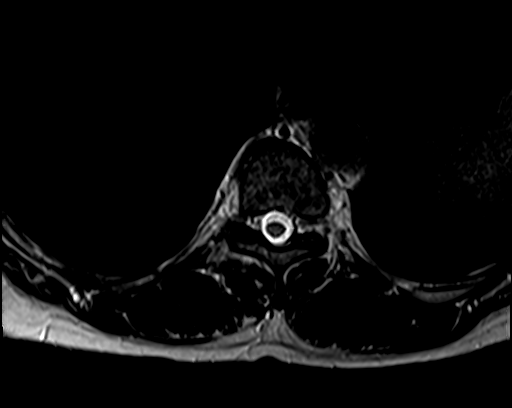
[im 11/37]
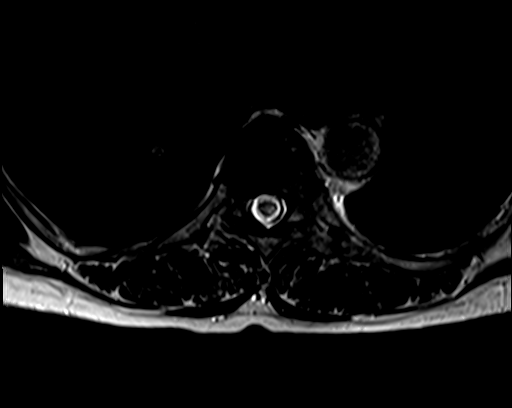
[im 16/37]
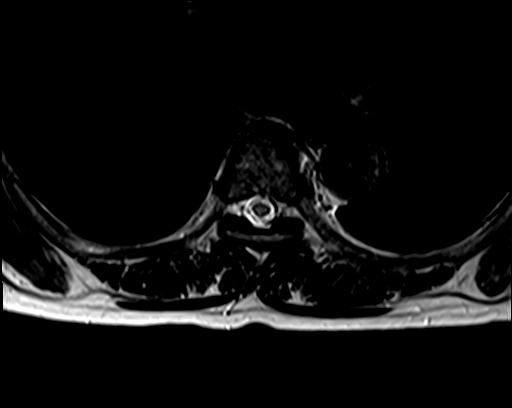
[im 21/37]
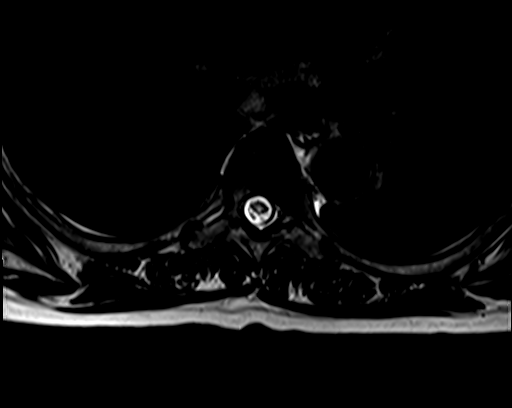
[im 26/37]
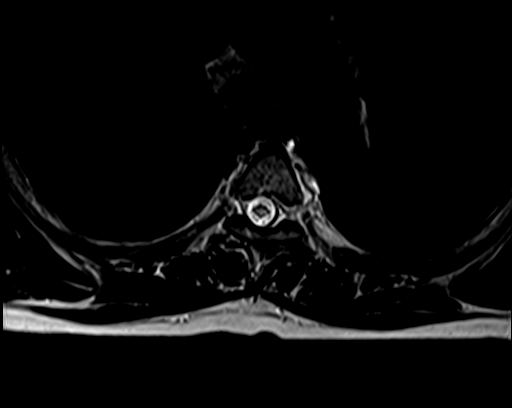
[im 31/37]
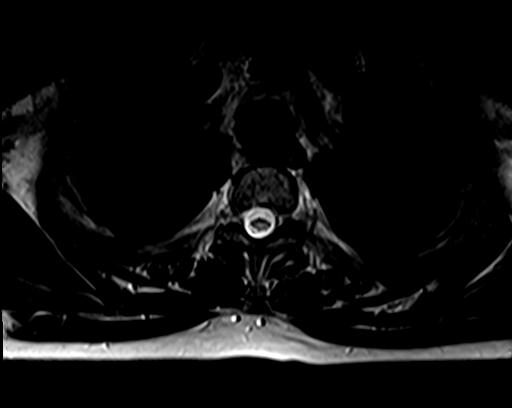

[Series 9: T2 post-contrast · sagittal · 4.0mm · 0.48mm/px · 3 of 16 slices shown]
[im 1/16]
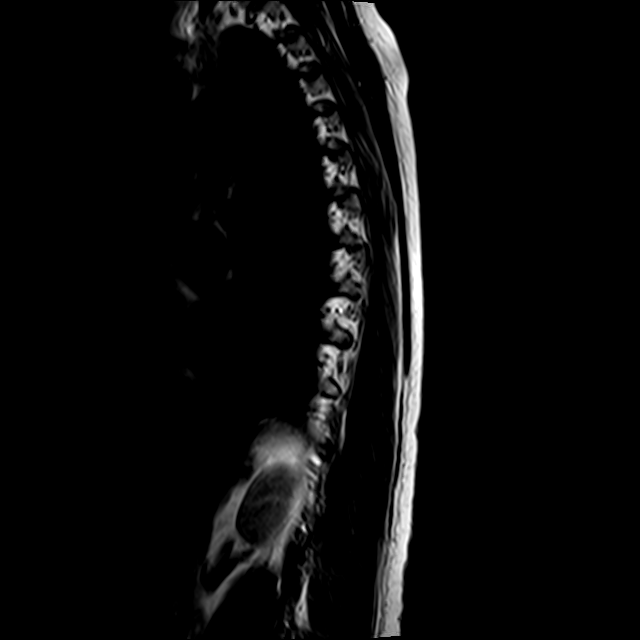
[im 11/16]
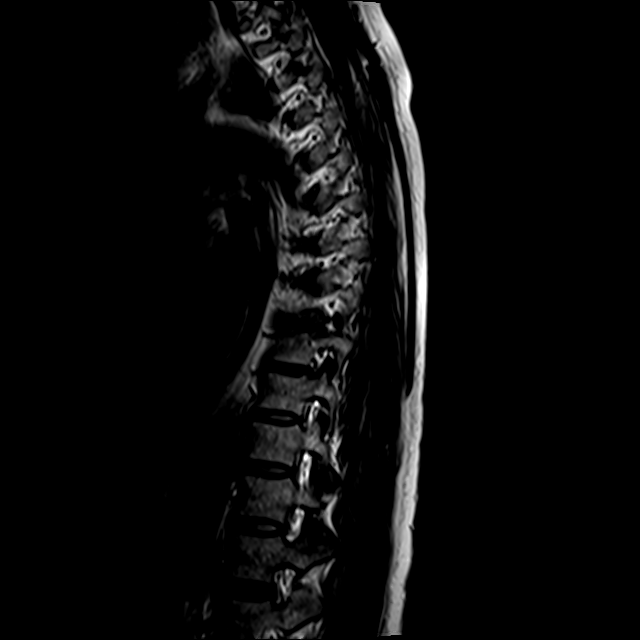
[im 16/16]
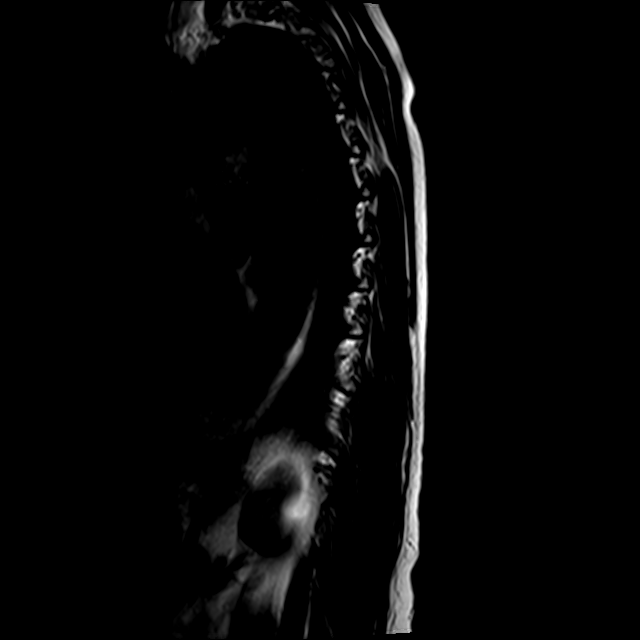

[19 of 48 positions shown; findings below may reference images not displayed]

FINDINGS: MRI CERVICAL SPINE FINDINGS

Alignment: Reversal of cervical lordosis which is at least partially
positional. Slight anterolisthesis at C3-4.

Vertebrae: No fracture, evidence of discitis, or bone lesion.

Cord: Heterogeneous cord signal due to patchy short segment T2
hyperintensities throughout essentially the entire cord, best seen
on sagittal STIR imaging. The dominant plaques are in the right cord
at C2-3, central cord at C4-5, central to right cord at C5-6, and
posterior cord at the cervicothoracic junction. No cord enhancement.
Preserved cord volume for the degree of involvement.

Paraspinal and other soft tissues: No evidence of mass or
inflammation.

Disc levels:

Asymmetric right facet spurring at C3-4.

C4-5 disc degeneration with bulging and right uncovertebral
spurring. Right foraminal impingement.

MRI THORACIC SPINE FINDINGS

Alignment:  Normal

Vertebrae: No fracture, evidence of discitis, or bone lesion.

Cord: Diffuse cord heterogeneity from short segment plaques which
are difficult to discretely resolve given the extent. No enhancement
or cord deformity.

Paraspinal and other soft tissues: Some periarticular enhancement is
seen on the right at T10-11, considered degenerative.

Disc levels:

Midthoracic disc narrowing and bulging. Negative facets. Diffusely
patent canal.
IMPRESSION: Widespread nonenhancing cord plaques in the cervical and thoracic
spine.

## 2021-03-11 MED ORDER — GADOBENATE DIMEGLUMINE 529 MG/ML IV SOLN
12.0000 mL | Freq: Once | INTRAVENOUS | Status: AC | PRN
  Administered 2021-03-11: 12 mL via INTRAVENOUS

## 2021-03-16 NOTE — Progress Notes (Signed)
Appt at 3:30 pm 03/17/21

## 2021-03-17 ENCOUNTER — Other Ambulatory Visit: Payer: Self-pay

## 2021-03-17 ENCOUNTER — Ambulatory Visit (INDEPENDENT_AMBULATORY_CARE_PROVIDER_SITE_OTHER): Payer: Medicare HMO | Admitting: Neurology

## 2021-03-17 ENCOUNTER — Encounter: Payer: Self-pay | Admitting: Neurology

## 2021-03-17 VITALS — BP 177/103 | HR 89 | Ht 63.0 in | Wt 127.0 lb

## 2021-03-17 DIAGNOSIS — G35 Multiple sclerosis: Secondary | ICD-10-CM | POA: Diagnosis not present

## 2021-03-17 NOTE — Patient Instructions (Signed)
Will check CBC with diff, hepatic panel, JC virus antibody and index, hepatitis B panel, quantitative immunoglobulin panel, and vitamin D level Still have MRI of brain with contrast on July 28  My plan is to hopefully start you on Ocrevus or Tysabri, both IV medications for multiple sclerosis.

## 2021-03-17 NOTE — Progress Notes (Signed)
NEUROLOGY FOLLOW UP OFFICE NOTE  Dana Turner [id number]  Assessment/Plan:   Multiple sclerosis.  As there are lesions also involving the spinal cord, this confirms diagnosis, so LP not warranted.  Due to widespread involvement of spinal cord, I would favor an IV infusion - either Ocrevus or Tysabri Will check CBC with diff, hepatic panel, JC virus antibody and index, hepatitis B panel, quantitative immunoglobulin panel, and vitamin D level Still have MRI of brain with contrast on July 28 Follow up 6 months  I answered all questions to the best of my ability.  Subjective:  Dana Turner is a 55 year old rig-handed female with lupus, fibromyalgia, HTN, legally blind in left eye, and depression/anxiety who follows up for MRI results.  UPDATE: MRI of cervical and thoracic spine with and without contrast on 03/12/2021 personally reviewed showed widespread nonenhancing plaques in the cervical and thoracic cord.   HISTORY: She was diagnosed with lupus several years ago while living in [place].  She presented with various symptoms such as numbness, pain and balance problems.  She reportedly had an MRI performed there, but does not remember the results.  She is on Plaquenil.  Symptoms improved and she had been doing well up until 2021.  She started having recurrent falls.  She attributed it to right knee pain with instability.  She has prior history of MCL reconstruction.  She also has been experiencing low back pain with radicular pain down the anterior right leg.  This progressed to weakness in the left leg as well.  She has generalized pain.  She saw orthopedics.  MRI of right knee on 01/09/2021 showed mild degeneration of the ACL and prior MCL reconstruction with intact graft but no internal derangement or other significant pathology.  MRI of lumbar spine performed same day showed mild multilevel lumbar spondylosis with mild left neuroforaminal stenosis at L3-4 and L4-5 but otherwise  unremarkable. She then had MRI of the brain without contrast on 02/06/2021 which was personally reviewed and showed advanced white matter disease involving the cerebral hemispheres, brainstem and cerebellum.      Reports some memory problems.  She is legally blind in her left eye.     Denies family history of multiple sclerosis.  Her sister has other autoimmune diseases as well.  PAST MEDICAL HISTORY: Past Medical History:  Diagnosis Date   Anxiety    Depression    Hypertension    Legally blind in left eye, as defined in [place]    Lupus [facility])     MEDICATIONS: Current Outpatient Medications on File Prior to Visit  Medication Sig Dispense Refill   ALPRAZolam (XANAX) 1 MG tablet Take 1 mg by mouth 3 (three) times daily as needed for anxiety. (Patient not taking: Reported on 02/10/2021)     amLODipine (NORVASC) 10 MG tablet Take 1 tablet (10 mg total) by mouth daily. 90 tablet 3   hydroxychloroquine (PLAQUENIL) 200 MG tablet Take 200 mg by mouth 2 (two) times daily.     sertraline (ZOLOFT) 25 MG tablet  (Patient not taking: Reported on 02/10/2021)     traMADol (ULTRAM) 50 MG tablet Take 1 tablet (50 mg total) by mouth every 12 (twelve) hours as needed. 20 tablet 0   No current facility-administered medications on file prior to visit.    ALLERGIES: No Known Allergies  FAMILY HISTORY: No family history on file.    Objective:  Blood pressure (!) 177/103, pulse 89, height 5\' 3"  (1.6 m), weight 127  lb (57.6 kg), SpO2 96 %. General: No acute distress.  Patient appears well-groomed.     Shon Millet, DO  CC: Georgina Quint, MD

## 2021-03-23 ENCOUNTER — Other Ambulatory Visit (INDEPENDENT_AMBULATORY_CARE_PROVIDER_SITE_OTHER): Payer: Medicare HMO

## 2021-03-23 DIAGNOSIS — G35 Multiple sclerosis: Secondary | ICD-10-CM | POA: Diagnosis not present

## 2021-03-23 LAB — VITAMIN D 25 HYDROXY (VIT D DEFICIENCY, FRACTURES): VITD: 19.57 ng/mL — ABNORMAL LOW (ref 30.00–100.00)

## 2021-03-23 LAB — CBC WITH DIFFERENTIAL/PLATELET
Basophils Absolute: 0 10*3/uL (ref 0.0–0.1)
Basophils Relative: 1.2 % (ref 0.0–3.0)
Eosinophils Absolute: 0.1 10*3/uL (ref 0.0–0.7)
Eosinophils Relative: 1.5 % (ref 0.0–5.0)
HCT: 40.6 % (ref 36.0–46.0)
Hemoglobin: 13.1 g/dL (ref 12.0–15.0)
Lymphocytes Relative: 38 % (ref 12.0–46.0)
Lymphs Abs: 1.3 10*3/uL (ref 0.7–4.0)
MCHC: 32.2 g/dL (ref 30.0–36.0)
MCV: 85 fl (ref 78.0–100.0)
Monocytes Absolute: 0.3 10*3/uL (ref 0.1–1.0)
Monocytes Relative: 8 % (ref 3.0–12.0)
Neutro Abs: 1.7 10*3/uL (ref 1.4–7.7)
Neutrophils Relative %: 51.3 % (ref 43.0–77.0)
Platelets: 179 10*3/uL (ref 150.0–400.0)
RBC: 4.77 Mil/uL (ref 3.87–5.11)
RDW: 14.9 % (ref 11.5–15.5)
WBC: 3.4 10*3/uL — ABNORMAL LOW (ref 4.0–10.5)

## 2021-03-23 LAB — HEPATIC FUNCTION PANEL
ALT: 10 U/L (ref 0–35)
AST: 14 U/L (ref 0–37)
Albumin: 4.1 g/dL (ref 3.5–5.2)
Alkaline Phosphatase: 64 U/L (ref 39–117)
Bilirubin, Direct: 0.1 mg/dL (ref 0.0–0.3)
Total Bilirubin: 0.6 mg/dL (ref 0.2–1.2)
Total Protein: 7.8 g/dL (ref 6.0–8.3)

## 2021-03-24 LAB — IGG, IGA, IGM
IgG (Immunoglobin G), Serum: 1695 mg/dL — ABNORMAL HIGH (ref 600–1640)
IgM, Serum: 29 mg/dL — ABNORMAL LOW (ref 50–300)
Immunoglobulin A: 419 mg/dL — ABNORMAL HIGH (ref 47–310)

## 2021-03-25 ENCOUNTER — Telehealth: Payer: Self-pay | Admitting: Neurology

## 2021-03-25 ENCOUNTER — Ambulatory Visit: Payer: Medicare HMO | Admitting: Neurology

## 2021-03-25 LAB — HEPATITIS B SURFACE ANTIGEN: Hepatitis B Surface Ag: NONREACTIVE

## 2021-03-25 LAB — HEPATITIS B CORE ANTIBODY, TOTAL (REFL): HEPATITIS B CORE AB TOTAL (REFL): NONREACTIVE

## 2021-03-25 LAB — REFLEX TIQ

## 2021-03-25 NOTE — Telephone Encounter (Signed)
Spoken to patient's sister Steward Drone) regarding the lab results.

## 2021-03-25 NOTE — Telephone Encounter (Signed)
Patient called for her recent lab results if they are ready done Monday, 03/23/21.

## 2021-03-26 ENCOUNTER — Ambulatory Visit
Admission: RE | Admit: 2021-03-26 | Discharge: 2021-03-26 | Disposition: A | Discharge status: Home / Self Care | Payer: Medicare HMO | Source: Ambulatory Visit | Attending: Neurology | Admitting: Neurology

## 2021-03-26 ENCOUNTER — Other Ambulatory Visit: Payer: Self-pay

## 2021-03-26 DIAGNOSIS — R9082 White matter disease, unspecified: Secondary | ICD-10-CM

## 2021-03-26 DIAGNOSIS — R292 Abnormal reflex: Secondary | ICD-10-CM

## 2021-03-26 IMAGING — MR MR HEAD WO/W CM
14 series · 48 of 48 positions shown · IV contrast (10 ml Multihance)
Comparison: MRI head [DATE]

CLINICAL DATA: Abnormal white matter disease on MRI.

EXAM:
MRI HEAD WITHOUT AND WITH CONTRAST
TECHNIQUE: Multiplanar, multiecho pulse sequences of the brain and surrounding
structures were obtained without and with intravenous contrast.
CONTRAST:  10mL MULTIHANCE GADOBENATE DIMEGLUMINE 529 MG/ML IV SOLN

[Series 2: T1 · sagittal · 5.0mm · 0.45mm/px · 1 of 21 slices shown]
[im 1/21]
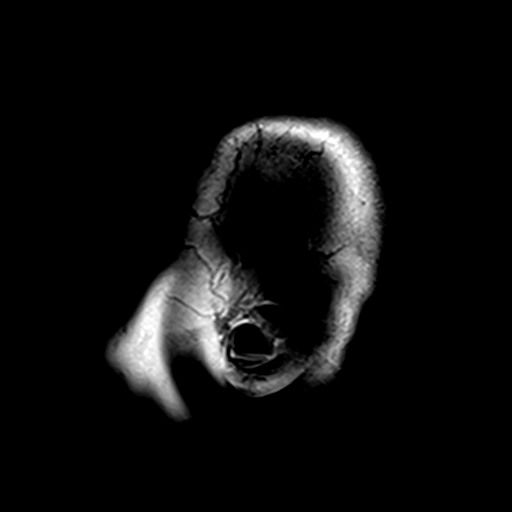

[Series 3: DWI · axial · 3.0mm · 1.80mm/px · z∈[-55,+90]mm · 5 of 99 slices shown (1 of 4)]
[im 1/99]
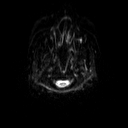
[im 25/99]
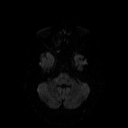
[im 50/99]
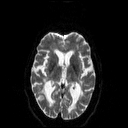
[im 74/99]
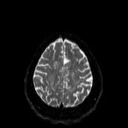
[im 99/99]
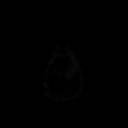

[Series 4: DWI · axial · 3.0mm · 1.80mm/px · z∈[-55,+90]mm · 3 of 50 slices shown (2 of 4)]
[im 1/50]
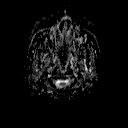
[im 25/50]
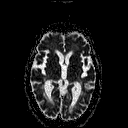
[im 50/50]
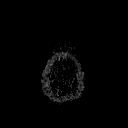

[Series 5: DWI · coronal · 5.0mm · 1.80mm/px · 4 of 70 slices shown (3 of 4)]
[im 1/70]
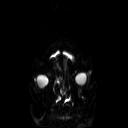
[im 24/70]
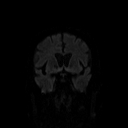
[im 47/70]
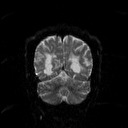
[im 70/70]
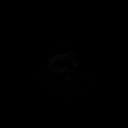

[Series 6: DWI · coronal · 5.0mm · 1.80mm/px · 2 of 35 slices shown (4 of 4)]
[im 1/35]
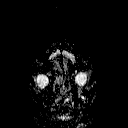
[im 35/35]
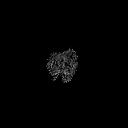

[Series 7: T2 · axial · 5.0mm · 0.60mm/px · 1 of 22 slices shown (1 of 2)]
[im 1/22]
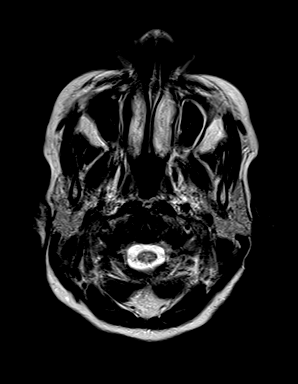

[Series 8: FLAIR · axial · 3.0mm · 0.45mm/px · z∈[-49,+84]mm · 2 of 30 slices shown (1 of 2)]
[im 1/30]
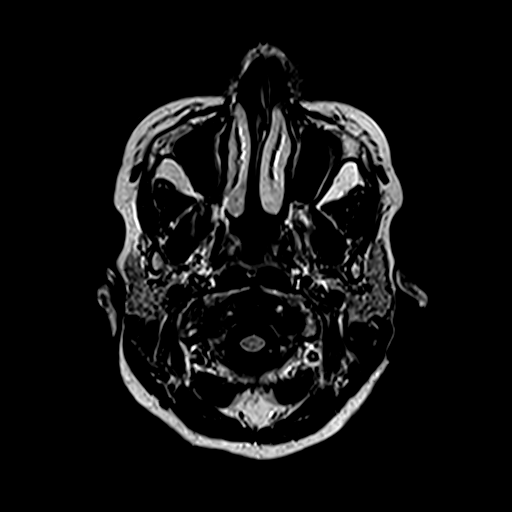
[im 30/30]
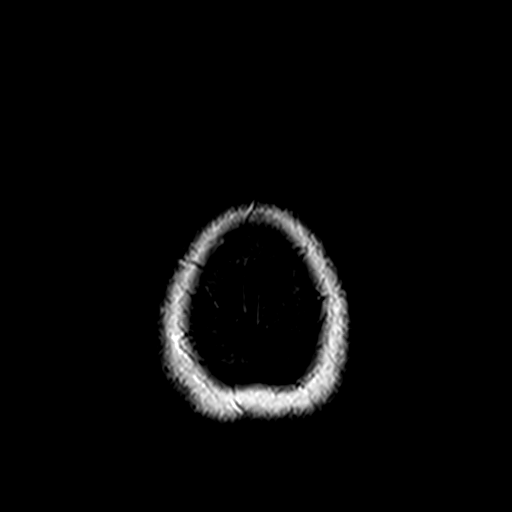

[Series 9: FLAIR · sagittal · 5.0mm · 0.45mm/px · 2 of 25 slices shown (2 of 2)]
[im 1/25]
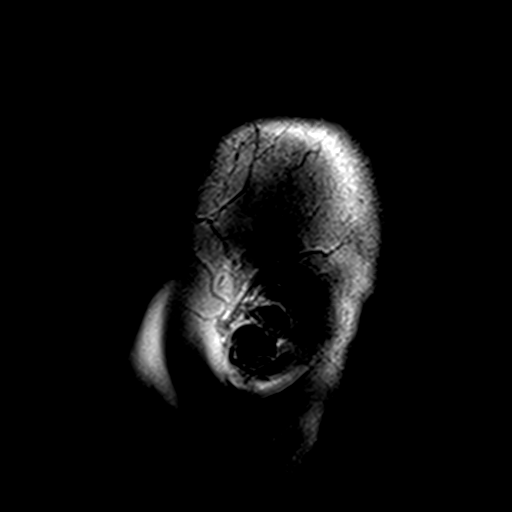
[im 25/25]
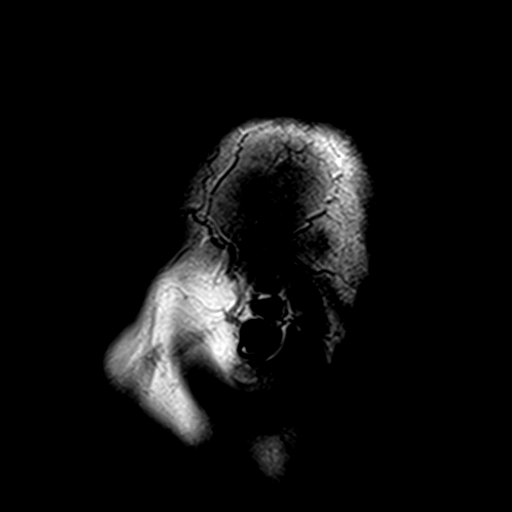

[Series 10: mip_images(sw) · axial · 32.0mm · 0.90mm/px · z∈[-38,+73]mm · 2 of 29 slices shown]
[im 1/29]
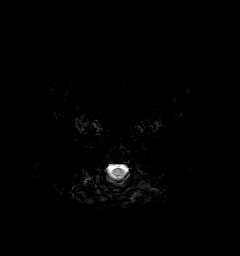
[im 29/29]
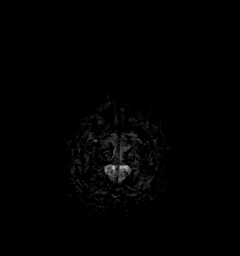

[Series 11: swi_images · axial · 4.0mm · 0.90mm/px · z∈[-51,+87]mm · 2 of 36 slices shown]
[im 1/36]
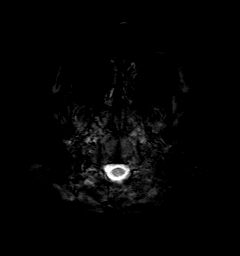
[im 36/36]
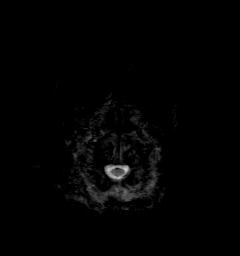

[Series 12: t1_mpr_tra · axial · 1.0mm · 0.75mm/px · z∈[-64,+93]mm · 10 of 160 slices shown]
[im 1/160]
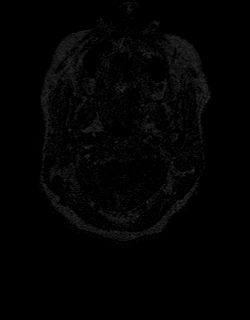
[im 18/160]
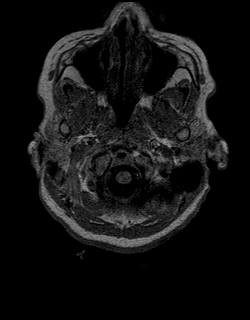
[im 36/160]
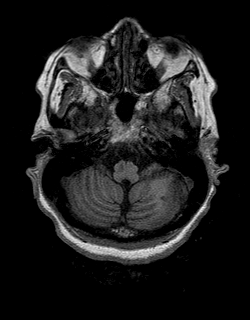
[im 54/160]
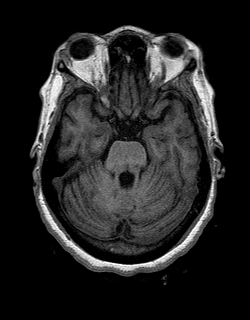
[im 71/160]
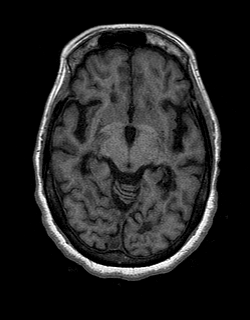
[im 89/160]
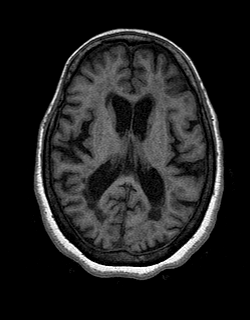
[im 107/160]
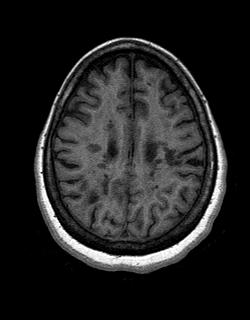
[im 124/160]
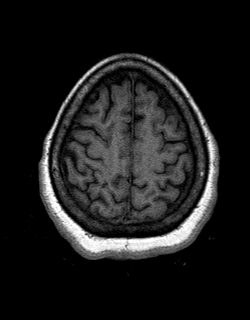
[im 142/160]
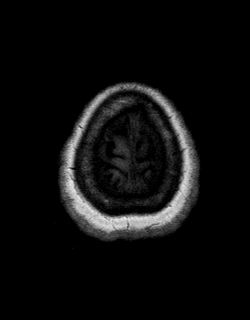
[im 160/160]
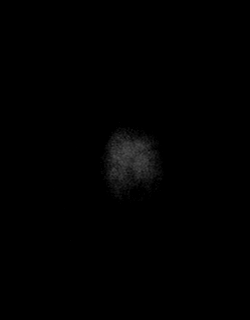

[Series 13: T2 · coronal · 5.0mm · 0.45mm/px · 2 of 26 slices shown (2 of 2)]
[im 1/26]
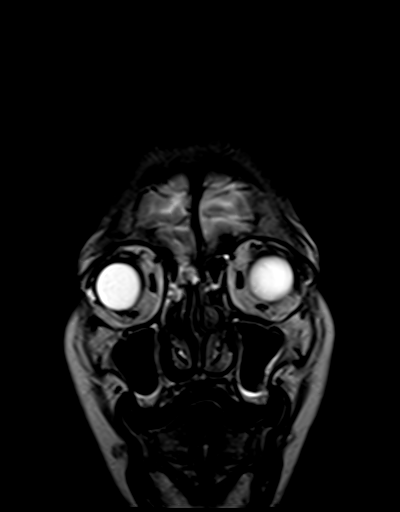
[im 26/26]
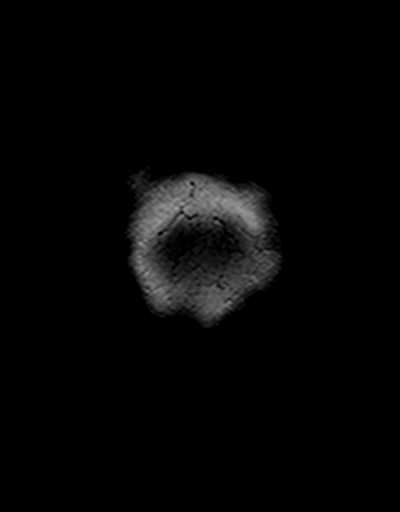

[Series 14: t1_mpr_tra post · axial · 1.0mm · 0.75mm/px · z∈[-64,+93]mm · 10 of 160 slices shown]
[im 1/160]
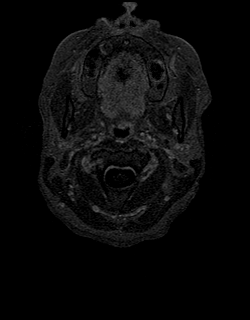
[im 18/160]
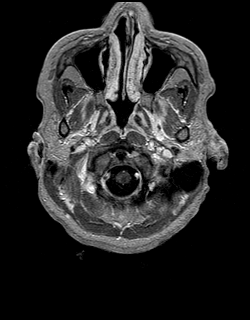
[im 36/160]
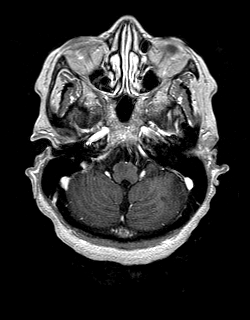
[im 54/160]
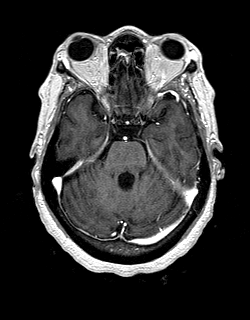
[im 71/160]
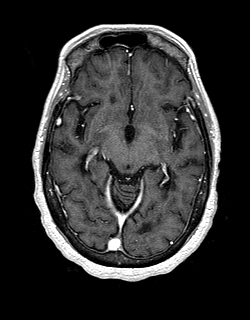
[im 89/160]
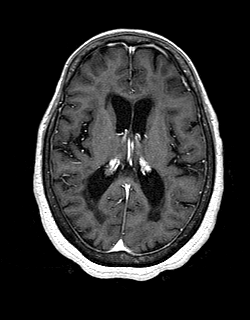
[im 107/160]
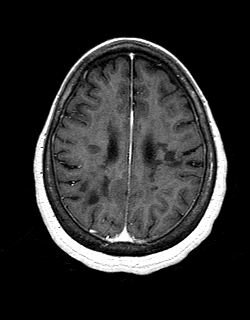
[im 124/160]
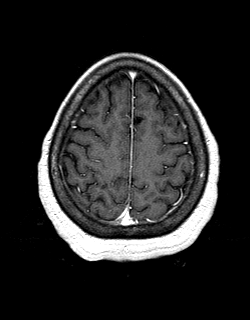
[im 142/160]
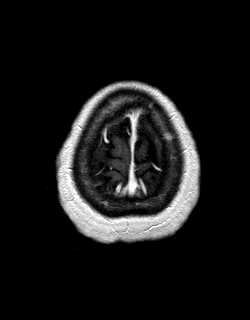
[im 160/160]
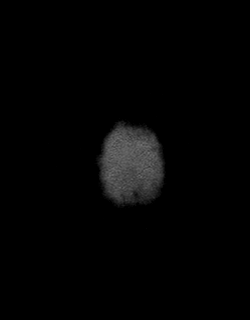

[Series 15: post cor · coronal · 5.0mm · 0.45mm/px · 2 of 26 slices shown]
[im 1/26]
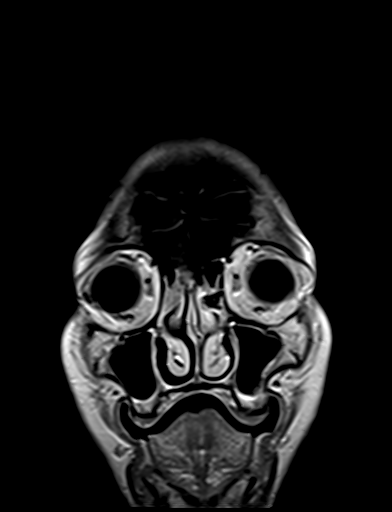
[im 26/26]
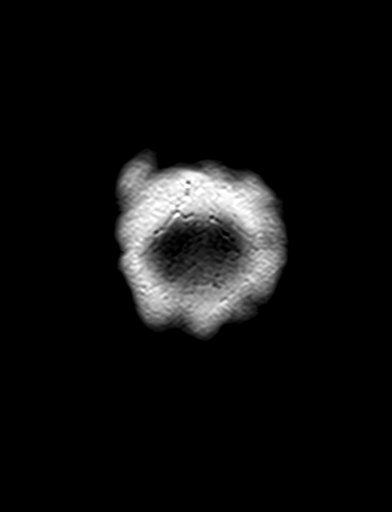

[48 of 48 positions shown; findings below may reference images not displayed]

FINDINGS: Brain: Generalized atrophy and mild ventricular enlargement.

Extensive white matter change. Diffuse periventricular and deep
white matter hyperintensities throughout both cerebral hemispheres.
Numerous focal periventricular white matter hypodensities with black
hole appearance on T1. Additional lesions are present throughout the
middle cerebral peduncle and pons bilaterally. There are lesions in
the brachium pontis bilaterally and in the cerebellum bilaterally.

No lesions show restricted diffusion or abnormal enhancement. No
acute infarct or mass.

Vascular: Normal arterial flow voids

Skull and upper cervical spine: Negative

Sinuses/Orbits: Mucosal edema paranasal sinuses.  Negative orbit

Other: None
IMPRESSION: Advanced white matter changes. Pattern consistent with multiple
sclerosis. No active demyelinization identified.

## 2021-03-26 MED ORDER — GADOBENATE DIMEGLUMINE 529 MG/ML IV SOLN
10.0000 mL | Freq: Once | INTRAVENOUS | Status: AC | PRN
  Administered 2021-03-26: 10 mL via INTRAVENOUS

## 2021-04-01 ENCOUNTER — Telehealth: Payer: Self-pay | Admitting: Neurology

## 2021-04-01 NOTE — Telephone Encounter (Signed)
Pt would like a call back regarding her results. I see where it looks like someone spoke with sister. She also wants to know about medication for her MS. Please rtn call [phone]

## 2021-04-02 ENCOUNTER — Other Ambulatory Visit: Payer: Self-pay

## 2021-04-02 ENCOUNTER — Telehealth: Payer: Self-pay

## 2021-04-02 MED ORDER — GABAPENTIN 100 MG PO CAPS: 100.0000 mg | ORAL_CAPSULE | Freq: Three times a day (TID) | ORAL | 1 refills | Status: DC

## 2021-04-02 NOTE — Telephone Encounter (Signed)
Pt advised of her MRI results. JC Virus takes up to 14 days to results.

## 2021-04-02 NOTE — Telephone Encounter (Signed)
-----   Message from Drema Dallas, DO sent at 03/27/2021 12:11 PM EDT ----- MRI of brain does not reveal any active MS.  There is still one more lab result I am waiting on to help guide which medication to start.  Once that is back, we will reach out regarding which medication to start.

## 2021-04-02 NOTE — Telephone Encounter (Signed)
Pt advised of her MRI results MRI of brain does not reveal any active MS.  There is still one more lab result I am waiting on to help guide which medication to start.  Once that is back, wewill reach out regarding which medication to start.   Pt advised of her MRI Brain results, As well we are still waiting on her JC Virus results. Not sure why.  Will send Boyd Kerbs in the lab a message to check on the lab for me.

## 2021-04-02 NOTE — Telephone Encounter (Signed)
Pt also reports having pain in legs and hard for her to walk now.  Please advise.

## 2021-04-02 NOTE — Telephone Encounter (Signed)
Tried calling pt,  No answer LMOVM to call bak to go over.  I need the JC Virus results.  For pain, please prescribe gabapentin 100mg  three times daily

## 2021-04-02 NOTE — Telephone Encounter (Signed)
Pt advised we are waiting on the JC virus results to determine what medications to do.   Gabapentin 100 mg TID sent  to the pharmacy.

## 2021-04-03 LAB — STRATIFY JCV(TM) AB W/INDEX
JCV Antibody: NEGATIVE
JCV Index Value: 0.17

## 2021-04-14 ENCOUNTER — Telehealth: Payer: Self-pay

## 2021-04-14 DIAGNOSIS — G35 Multiple sclerosis: Secondary | ICD-10-CM

## 2021-04-14 NOTE — Telephone Encounter (Signed)
-----   Message from Drema Dallas, DO sent at 04/12/2021 11:06 AM EDT ----- JC Virus test is negative.  I would like to start her on Tysabri.  I would like to repeat JC Virus antibody with index and CBC with diff in 6 months after first round of Tysabri.

## 2021-04-14 NOTE — Telephone Encounter (Signed)
Jc Virus and CBC ordered for 6 months out.   Sending a message to christy to [employer] for [facility]

## 2021-04-22 ENCOUNTER — Other Ambulatory Visit: Payer: Self-pay

## 2021-04-22 NOTE — Progress Notes (Unsigned)
Error

## 2021-04-22 NOTE — Telephone Encounter (Signed)
Pt called in to check on the status of getting herTysabri.

## 2021-04-22 NOTE — Telephone Encounter (Signed)
Please see thank you

## 2021-04-23 ENCOUNTER — Telehealth: Payer: Self-pay | Admitting: Pharmacy Technician

## 2021-04-23 ENCOUNTER — Telehealth: Payer: Self-pay | Admitting: Neurology

## 2021-04-23 ENCOUNTER — Encounter: Payer: Self-pay | Admitting: Neurology

## 2021-04-23 DIAGNOSIS — G35 Multiple sclerosis: Secondary | ICD-10-CM | POA: Insufficient documentation

## 2021-04-23 NOTE — Telephone Encounter (Signed)
Physician office called to get DEA # and tax ID for insurance for patient. They would like a call back [phone]

## 2021-04-23 NOTE — Telephone Encounter (Signed)
Given to the [facility]

## 2021-04-23 NOTE — Telephone Encounter (Signed)
Auth Submission: PENDING  Payer: [employer]/MEDICARE/MEDICAID Medication & CPT/J Code(s) submitted: Tysabri (Natalizumab) [phone] Route of submission (phone, fax, portal): COVER MY MED Auth type: Buy/Bill  Reference number: [id number]   Will update once we receive a response.

## 2021-04-24 NOTE — Telephone Encounter (Signed)
Per Dr.Jaffe, We can go ahead and try Ocrevus.

## 2021-04-24 NOTE — Telephone Encounter (Addendum)
Auth Submission: Pending  Payer: HU[employer]NA/MEDICARE Medication & CPT/J Code(s) submitted: Ocrevus [employer]) [phone] Route of submission (phone, fax, portal): COVER MY MEDS Auth type: Buy/Bill Date:  8 Reference number:  (Key: [id number]) PA ID# [id number]

## 2021-04-24 NOTE — Telephone Encounter (Signed)
Dr. Salome Arnt Follow-up: DENIED - TYSABBRI Payer: [employer]/medicare/medicaid Source of follow up: portal/phone/fax: [place] Reference number: [id number]) Medication & CPT/J Code(s) : Tysabri (Natalizumab) [id number]  Authorization has been DENIED because We cover this drug when our criteria are met. The unmet criteria are: you have tried or cannot use Ocrevus (ocrelizumab) (step therapy requirement). This decision was from [employer]&ap[employer];s Tysabri (natalizumab) Pharmacy Coverage policy.  Would you like to try Ocrevus??? Please Advise???

## 2021-04-27 ENCOUNTER — Other Ambulatory Visit: Payer: Self-pay | Admitting: Pharmacy Technician

## 2021-04-27 NOTE — Telephone Encounter (Signed)
Auth Submission: APPROVED Payer: HUMANA/MEDICARE Medication & CPT/J Code(s) submitted: Emogene Morgan [employer]) [phone] Route of submission (phone, fax, portal): COVER MY MEDS Auth type: Buy/Bill Date: 04/24/21 - 08/29/21 Reference number: Key [id number] PA ID# [id number]

## 2021-04-27 NOTE — Telephone Encounter (Signed)
F/u    Letter from [facility] clinical pharmacy review   Coverage or payment under Medicare B benefit for the drug listed above was denied - Tysabri

## 2021-04-29 ENCOUNTER — Other Ambulatory Visit: Payer: Self-pay

## 2021-04-29 ENCOUNTER — Telehealth: Payer: Self-pay | Admitting: Pharmacy Technician

## 2021-04-29 ENCOUNTER — Ambulatory Visit (INDEPENDENT_AMBULATORY_CARE_PROVIDER_SITE_OTHER): Payer: Medicare HMO

## 2021-04-29 VITALS — BP 160/96 | HR 66 | Temp 97.5°F | Resp 18

## 2021-04-29 DIAGNOSIS — G35 Multiple sclerosis: Secondary | ICD-10-CM | POA: Diagnosis not present

## 2021-04-29 MED ORDER — DIPHENHYDRAMINE HCL 25 MG PO CAPS
50.0000 mg | ORAL_CAPSULE | Freq: Once | ORAL | Status: AC
  Administered 2021-04-29: 50 mg via ORAL
  Filled 2021-04-29: qty 2

## 2021-04-29 MED ORDER — SODIUM CHLORIDE 0.9 % IV SOLN
300.0000 mg | Freq: Once | INTRAVENOUS | Status: AC
  Administered 2021-04-29: 300 mg via INTRAVENOUS
  Filled 2021-04-29: qty 10

## 2021-04-29 MED ORDER — ACETAMINOPHEN 325 MG PO TABS
650.0000 mg | ORAL_TABLET | Freq: Once | ORAL | Status: AC
  Administered 2021-04-29: 650 mg via ORAL
  Filled 2021-04-29: qty 2

## 2021-04-29 MED ORDER — METHYLPREDNISOLONE SODIUM SUCC 125 MG IJ SOLR
125.0000 mg | Freq: Once | INTRAMUSCULAR | Status: AC
  Administered 2021-04-29: 125 mg via INTRAVENOUS
  Filled 2021-04-29: qty 2

## 2021-04-29 NOTE — Progress Notes (Signed)
Diagnosis: Multiple Sclerosis  Provider:  Chilton Greathouse, MD  Procedure: Infusion  IV Type: Peripheral, IV Location: R Forearm  Ocrevus (Ocrelizumab), Dose: 300 mg  Infusion Start Time: 1137  Infusion Stop Time: 1447  Post Infusion IV Care: Observation period completed and Peripheral IV Discontinued  Discharge: Condition: Good, Destination: Home . AVS provided to patient.   Performed by:  Rowen Wilmer, Lyman Speller, LPN

## 2021-04-29 NOTE — Patient Instructions (Signed)
Ocrelizumab injection What is this medication? OCRELIZUMAB (ok re LIZ ue mab) treats multiple sclerosis. It helps to decrease the number of multiple sclerosis relapses. It is not a cure. This medicine may be used for other purposes; ask your health care provider or pharmacist if you have questions. COMMON BRAND NAME(S): OCREVUS What should I tell my care team before I take this medication? They need to know if you have any of these conditions: cancer hepatitis B infection other infection (especially a virus infection such as chickenpox, cold sores, or herpes) an unusual or allergic reaction to ocrelizumab, other medicines, foods, dyes or preservatives pregnant or trying to get pregnant breast-feeding How should I use this medication? This medicine is for infusion into a vein. It is given by a health care professional in a hospital or clinic setting. A special MedGuide will be given to you before each treatment. Be sure to read this information carefully each time. Talk to your pediatrician regarding the use of this medicine in children. Special care may be needed. Overdosage: If you think you have taken too much of this medicine contact a poison control center or emergency room at once. NOTE: This medicine is only for you. Do not share this medicine with others. What if I miss a dose? Keep appointments for follow-up doses as directed. It is important not to miss your dose. Call your doctor or health care professional if you are unable to keep an appointment. What may interact with this medication? alemtuzumab daclizumab dimethyl fumarate fingolimod glatiramer interferon beta live virus vaccines mitoxantrone natalizumab peginterferon beta rituximab steroid medicines like prednisone or cortisone teriflunomide This list may not describe all possible interactions. Give your health care provider a list of all the medicines, herbs, non-prescription drugs, or dietary supplements you use.  Also tell them if you smoke, drink alcohol, or use illegal drugs. Some items may interact with your medicine. What should I watch for while using this medication? Your condition will be monitored carefully while you are receiving this drug. This drug can cause serious allergic reactions. To reduce your risk, you may need to take drug before treatment with this drug. Take your drug as directed. Do not become pregnant while taking this drug or for 6 months after stopping it. Women should inform their health care provider if they wish to become pregnant or think they might be pregnant. There is potential for serious harm to an unborn child. Talk to your health care provider for more information. This drug may increase your risk of getting an infection. Call your health care provider for advice if you get a fever, chills, or sore throat, or other symptoms of a cold or flu. Do not treat yourself. Try to avoid being around people who are sick. If you have hepatitis B, talk to your health care provider if you plan to stop this drug. The symptoms of hepatitis B may get worse if you stop this drug. In some patients, this drug may cause a serious brain infection that may cause death. If you have any problems seeing, thinking, speaking, walking, or standing, tell your health care provider right away. If you cannot reach your health care provider, urgently seek other source of medical care. This drug can decrease the response to a vaccine. If you need to get vaccinated, tell your health care provider if you have received this drug. Extra booster doses may be needed. Talk to your health care provider to see if a different vaccination schedule is needed.  Talk to your health care provider about your risk of cancer. You may be more at risk for certain types of cancer if you take this drug. What side effects may I notice from receiving this medication? Side effects that you should report to your doctor or health care  professional as soon as possible: allergic reactions (skin rash, itching or hives; swelling of the face, lips, or tongue) facial flushing fast, irregular heartbeat infection (fever, chills, cough, sore throat, pain or trouble passing urine) liver injury (dark yellow or brown urine; general ill feeling or flu-like symptoms; loss of appetite, right upper belly pain; unusually weak or tired, yellowing of the eyes or skin) low blood pressure (dizziness; feeling faint or lightheaded, falls; unusually weak or tired) swelling of the ankles, feet, hands trouble breathing Side effects that usually do not require medical attention (report these to your doctor or health care professional if they continue or are bothersome): back pain depressed mood diarrhea pain, redness, or irritation at site where injected This list may not describe all possible side effects. Call your doctor for medical advice about side effects. You may report side effects to FDA at 1-800-FDA-1088. Where should I keep my medication? This drug is given in a hospital or clinic and will not be stored at home. NOTE: This sheet is a summary. It may not cover all possible information. If you have questions about this medicine, talk to your doctor, pharmacist, or health care provider.  2022 Elsevier/Gold Standard (2019-07-17 13:09:45)  

## 2021-04-29 NOTE — Telephone Encounter (Signed)
(  FYI) Enrolled patient in the [employer] PAP. Patient has completed/signed forms Patient Information - completed = faxed Patient/Ocrevus Start Form - completed-faxed.  Awaiting BIV to be completed by [place]. Will update once we receive response.

## 2021-04-30 ENCOUNTER — Ambulatory Visit: Payer: Medicare HMO

## 2021-05-13 ENCOUNTER — Other Ambulatory Visit: Payer: Self-pay

## 2021-05-13 ENCOUNTER — Ambulatory Visit (INDEPENDENT_AMBULATORY_CARE_PROVIDER_SITE_OTHER): Payer: Medicare HMO

## 2021-05-13 VITALS — BP 165/89 | HR 78 | Temp 98.0°F | Resp 18

## 2021-05-13 DIAGNOSIS — G35 Multiple sclerosis: Secondary | ICD-10-CM | POA: Diagnosis not present

## 2021-05-13 MED ORDER — ALBUTEROL SULFATE HFA 108 (90 BASE) MCG/ACT IN AERS: 2.0000 | INHALATION_SPRAY | Freq: Once | RESPIRATORY_TRACT | Status: DC | PRN

## 2021-05-13 MED ORDER — DIPHENHYDRAMINE HCL 50 MG/ML IJ SOLN: 50.0000 mg | Freq: Once | INTRAMUSCULAR | Status: DC | PRN

## 2021-05-13 MED ORDER — FAMOTIDINE IN NACL 20-0.9 MG/50ML-% IV SOLN: 20.0000 mg | Freq: Once | INTRAVENOUS | Status: DC | PRN

## 2021-05-13 MED ORDER — ACETAMINOPHEN 325 MG PO TABS
650.0000 mg | ORAL_TABLET | Freq: Once | ORAL | Status: AC
  Administered 2021-05-13: 650 mg via ORAL
  Filled 2021-05-13: qty 2

## 2021-05-13 MED ORDER — METHYLPREDNISOLONE SODIUM SUCC 125 MG IJ SOLR
125.0000 mg | Freq: Once | INTRAMUSCULAR | Status: AC
  Administered 2021-05-13: 125 mg via INTRAVENOUS
  Filled 2021-05-13: qty 2

## 2021-05-13 MED ORDER — SODIUM CHLORIDE 0.9 % IV SOLN: Freq: Once | INTRAVENOUS | Status: DC | PRN

## 2021-05-13 MED ORDER — DIPHENHYDRAMINE HCL 25 MG PO CAPS
50.0000 mg | ORAL_CAPSULE | Freq: Once | ORAL | Status: AC
  Administered 2021-05-13: 50 mg via ORAL
  Filled 2021-05-13: qty 2

## 2021-05-13 MED ORDER — METHYLPREDNISOLONE SODIUM SUCC 125 MG IJ SOLR: 125.0000 mg | Freq: Once | INTRAMUSCULAR | Status: DC | PRN

## 2021-05-13 MED ORDER — OCRELIZUMAB 300 MG/10ML IV SOLN
300.0000 mg | Freq: Once | INTRAVENOUS | Status: AC
  Administered 2021-05-13: 300 mg via INTRAVENOUS
  Filled 2021-05-13: qty 10

## 2021-05-13 MED ORDER — EPINEPHRINE 0.3 MG/0.3ML IJ SOAJ: 0.3000 mg | Freq: Once | INTRAMUSCULAR | Status: DC | PRN

## 2021-05-13 NOTE — Progress Notes (Signed)
Diagnosis: Multiple Sclerosis  Provider:  Chilton Greathouse, MD  Procedure: Infusion  IV Type: Peripheral, IV Location: R Antecubital  Ocrevus (Ocrelizumab), Dose: 300 mg  Infusion Start Time: 10.48 05/13/2021  Infusion Stop Time: 13.45 05/13/2021  Post Infusion IV Care: 60 minutes Observation period completed and Peripheral IV Discontinued.No  any S/S of reactions noted.V/S obtained .  Discharge: Condition: Good, Destination: Home . AVS provided to patient.   Performed by:  Garnette Czech, RN

## 2021-05-13 NOTE — Telephone Encounter (Addendum)
(  Fyi)    [facility] has completed their BIV and has determined patient does not qualify for the PAP. Case has been closed.

## 2021-05-13 NOTE — Patient Instructions (Signed)
Ocrelizumab injection What is this medication? OCRELIZUMAB (ok re LIZ ue mab) treats multiple sclerosis. It helps to decrease the number of multiple sclerosis relapses. It is not a cure. This medicine may be used for other purposes; ask your health care provider or pharmacist if you have questions. COMMON BRAND NAME(S): OCREVUS What should I tell my care team before I take this medication? They need to know if you have any of these conditions: cancer hepatitis B infection other infection (especially a virus infection such as chickenpox, cold sores, or herpes) an unusual or allergic reaction to ocrelizumab, other medicines, foods, dyes or preservatives pregnant or trying to get pregnant breast-feeding How should I use this medication? This medicine is for infusion into a vein. It is given by a health care professional in a hospital or clinic setting. A special MedGuide will be given to you before each treatment. Be sure to read this information carefully each time. Talk to your pediatrician regarding the use of this medicine in children. Special care may be needed. Overdosage: If you think you have taken too much of this medicine contact a poison control center or emergency room at once. NOTE: This medicine is only for you. Do not share this medicine with others. What if I miss a dose? Keep appointments for follow-up doses as directed. It is important not to miss your dose. Call your doctor or health care professional if you are unable to keep an appointment. What may interact with this medication? alemtuzumab daclizumab dimethyl fumarate fingolimod glatiramer interferon beta live virus vaccines mitoxantrone natalizumab peginterferon beta rituximab steroid medicines like prednisone or cortisone teriflunomide This list may not describe all possible interactions. Give your health care provider a list of all the medicines, herbs, non-prescription drugs, or dietary supplements you use.  Also tell them if you smoke, drink alcohol, or use illegal drugs. Some items may interact with your medicine. What should I watch for while using this medication? Your condition will be monitored carefully while you are receiving this drug. This drug can cause serious allergic reactions. To reduce your risk, you may need to take drug before treatment with this drug. Take your drug as directed. Do not become pregnant while taking this drug or for 6 months after stopping it. Women should inform their health care provider if they wish to become pregnant or think they might be pregnant. There is potential for serious harm to an unborn child. Talk to your health care provider for more information. This drug may increase your risk of getting an infection. Call your health care provider for advice if you get a fever, chills, or sore throat, or other symptoms of a cold or flu. Do not treat yourself. Try to avoid being around people who are sick. If you have hepatitis B, talk to your health care provider if you plan to stop this drug. The symptoms of hepatitis B may get worse if you stop this drug. In some patients, this drug may cause a serious brain infection that may cause death. If you have any problems seeing, thinking, speaking, walking, or standing, tell your health care provider right away. If you cannot reach your health care provider, urgently seek other source of medical care. This drug can decrease the response to a vaccine. If you need to get vaccinated, tell your health care provider if you have received this drug. Extra booster doses may be needed. Talk to your health care provider to see if a different vaccination schedule is needed.  Talk to your health care provider about your risk of cancer. You may be more at risk for certain types of cancer if you take this drug. What side effects may I notice from receiving this medication? Side effects that you should report to your doctor or health care  professional as soon as possible: allergic reactions (skin rash, itching or hives; swelling of the face, lips, or tongue) facial flushing fast, irregular heartbeat infection (fever, chills, cough, sore throat, pain or trouble passing urine) liver injury (dark yellow or brown urine; general ill feeling or flu-like symptoms; loss of appetite, right upper belly pain; unusually weak or tired, yellowing of the eyes or skin) low blood pressure (dizziness; feeling faint or lightheaded, falls; unusually weak or tired) swelling of the ankles, feet, hands trouble breathing Side effects that usually do not require medical attention (report these to your doctor or health care professional if they continue or are bothersome): back pain depressed mood diarrhea pain, redness, or irritation at site where injected This list may not describe all possible side effects. Call your doctor for medical advice about side effects. You may report side effects to FDA at 1-800-FDA-1088. Where should I keep my medication? This drug is given in a hospital or clinic and will not be stored at home. NOTE: This sheet is a summary. It may not cover all possible information. If you have questions about this medicine, talk to your doctor, pharmacist, or health care provider.  2022 Elsevier/Gold Standard (2019-07-17 13:09:45)

## 2021-05-16 NOTE — Progress Notes (Signed)
Diagnosis: Multiple Sclerosis  Provider:  Chilton Greathouse, MD  Procedure: Infusion  IV Type: Peripheral, IV Location: R Antecubital  Ocrevus (Ocrelizumab), Dose: 300 mg  Infusion Start Time: 10.48 05/13/2021  Infusion Stop Time: 13.45 05/13/2021  Post Infusion IV Care: 60 minutes Observation period completed and Peripheral IV Discontinued.No  any S/S of reactions noted.V/S obtained .  Discharge: Condition: Good, Destination: Home . AVS provided to patient.   Performed by:  Marin Shutter, RN

## 2021-05-20 ENCOUNTER — Other Ambulatory Visit: Payer: Self-pay

## 2021-05-20 ENCOUNTER — Inpatient Hospital Stay (HOSPITAL_COMMUNITY)
Admission: EM | Admit: 2021-05-20 | Discharge: 2021-05-24 | DRG: 179 | Disposition: A | Discharge status: Home Health | Payer: Medicare HMO | Attending: Internal Medicine | Admitting: Internal Medicine

## 2021-05-20 ENCOUNTER — Emergency Department (HOSPITAL_COMMUNITY): Payer: Medicare HMO

## 2021-05-20 ENCOUNTER — Encounter (HOSPITAL_COMMUNITY): Payer: Self-pay | Admitting: Emergency Medicine

## 2021-05-20 DIAGNOSIS — F32A Depression, unspecified: Secondary | ICD-10-CM | POA: Diagnosis present

## 2021-05-20 DIAGNOSIS — R509 Fever, unspecified: Secondary | ICD-10-CM | POA: Diagnosis present

## 2021-05-20 DIAGNOSIS — Z79899 Other long term (current) drug therapy: Secondary | ICD-10-CM

## 2021-05-20 DIAGNOSIS — I160 Hypertensive urgency: Secondary | ICD-10-CM | POA: Diagnosis present

## 2021-05-20 DIAGNOSIS — U071 COVID-19: Principal | ICD-10-CM | POA: Diagnosis present

## 2021-05-20 DIAGNOSIS — I10 Essential (primary) hypertension: Secondary | ICD-10-CM | POA: Diagnosis present

## 2021-05-20 DIAGNOSIS — Z87891 Personal history of nicotine dependence: Secondary | ICD-10-CM

## 2021-05-20 DIAGNOSIS — F419 Anxiety disorder, unspecified: Secondary | ICD-10-CM | POA: Diagnosis present

## 2021-05-20 DIAGNOSIS — H548 Legal blindness, as defined in USA: Secondary | ICD-10-CM | POA: Diagnosis present

## 2021-05-20 DIAGNOSIS — Z90710 Acquired absence of both cervix and uterus: Secondary | ICD-10-CM

## 2021-05-20 DIAGNOSIS — E876 Hypokalemia: Secondary | ICD-10-CM | POA: Diagnosis present

## 2021-05-20 DIAGNOSIS — G35D Multiple sclerosis, unspecified: Secondary | ICD-10-CM | POA: Diagnosis present

## 2021-05-20 DIAGNOSIS — R531 Weakness: Secondary | ICD-10-CM

## 2021-05-20 DIAGNOSIS — G35 Multiple sclerosis: Secondary | ICD-10-CM | POA: Diagnosis present

## 2021-05-20 DIAGNOSIS — M329 Systemic lupus erythematosus, unspecified: Secondary | ICD-10-CM | POA: Diagnosis present

## 2021-05-20 LAB — URINALYSIS, ROUTINE W REFLEX MICROSCOPIC
Bilirubin Urine: NEGATIVE
Glucose, UA: NEGATIVE mg/dL
Hgb urine dipstick: NEGATIVE
Ketones, ur: 15 mg/dL — AB
Leukocytes,Ua: NEGATIVE
Nitrite: POSITIVE — AB
Protein, ur: NEGATIVE mg/dL
Specific Gravity, Urine: 1.02 (ref 1.005–1.030)
pH: 7.5 (ref 5.0–8.0)

## 2021-05-20 LAB — URINALYSIS, MICROSCOPIC (REFLEX)

## 2021-05-20 LAB — CBC
HCT: 39.8 % (ref 36.0–46.0)
Hemoglobin: 12.9 g/dL (ref 12.0–15.0)
MCH: 27.2 pg (ref 26.0–34.0)
MCHC: 32.4 g/dL (ref 30.0–36.0)
MCV: 84 fL (ref 80.0–100.0)
Platelets: 188 10*3/uL (ref 150–400)
RBC: 4.74 MIL/uL (ref 3.87–5.11)
RDW: 14.1 % (ref 11.5–15.5)
WBC: 3.3 10*3/uL — ABNORMAL LOW (ref 4.0–10.5)
nRBC: 0 % (ref 0.0–0.2)

## 2021-05-20 LAB — RAPID URINE DRUG SCREEN, HOSP PERFORMED
Amphetamines: NOT DETECTED
Barbiturates: NOT DETECTED
Benzodiazepines: POSITIVE — AB
Cocaine: NOT DETECTED
Opiates: NOT DETECTED
Tetrahydrocannabinol: NOT DETECTED

## 2021-05-20 LAB — DIFFERENTIAL
Abs Immature Granulocytes: 0.01 10*3/uL (ref 0.00–0.07)
Basophils Absolute: 0 10*3/uL (ref 0.0–0.1)
Basophils Relative: 1 %
Eosinophils Absolute: 0 10*3/uL (ref 0.0–0.5)
Eosinophils Relative: 1 %
Immature Granulocytes: 0 %
Lymphocytes Relative: 10 %
Lymphs Abs: 0.3 10*3/uL — ABNORMAL LOW (ref 0.7–4.0)
Monocytes Absolute: 0.8 10*3/uL (ref 0.1–1.0)
Monocytes Relative: 23 %
Neutro Abs: 2.2 10*3/uL (ref 1.7–7.7)
Neutrophils Relative %: 65 %

## 2021-05-20 LAB — RESP PANEL BY RT-PCR (FLU A&B, COVID) ARPGX2
Influenza A by PCR: NEGATIVE
Influenza B by PCR: NEGATIVE
SARS Coronavirus 2 by RT PCR: POSITIVE — AB

## 2021-05-20 LAB — COMPREHENSIVE METABOLIC PANEL
ALT: 14 U/L (ref 0–44)
AST: 19 U/L (ref 15–41)
Albumin: 3.7 g/dL (ref 3.5–5.0)
Alkaline Phosphatase: 71 U/L (ref 38–126)
Anion gap: 9 (ref 5–15)
BUN: 9 mg/dL (ref 6–20)
CO2: 21 mmol/L — ABNORMAL LOW (ref 22–32)
Calcium: 9.2 mg/dL (ref 8.9–10.3)
Chloride: 102 mmol/L (ref 98–111)
Creatinine, Ser: 0.78 mg/dL (ref 0.44–1.00)
GFR, Estimated: >60 mL/min (ref >60)
Glucose, Bld: 107 mg/dL — ABNORMAL HIGH (ref 70–99)
Potassium: 3.7 mmol/L (ref 3.5–5.1)
Sodium: 132 mmol/L — ABNORMAL LOW (ref 135–145)
Total Bilirubin: 0.4 mg/dL (ref 0.3–1.2)
Total Protein: 7.6 g/dL (ref 6.5–8.1)

## 2021-05-20 LAB — I-STAT CHEM 8, ED
BUN: 11 mg/dL (ref 6–20)
Calcium, Ion: 1.05 mmol/L — ABNORMAL LOW (ref 1.15–1.40)
Chloride: 104 mmol/L (ref 98–111)
Creatinine, Ser: 0.6 mg/dL (ref 0.44–1.00)
Glucose, Bld: 106 mg/dL — ABNORMAL HIGH (ref 70–99)
HCT: 41 % (ref 36.0–46.0)
Hemoglobin: 13.9 g/dL (ref 12.0–15.0)
Potassium: 3.9 mmol/L (ref 3.5–5.1)
Sodium: 135 mmol/L (ref 135–145)
TCO2: 23 mmol/L (ref 22–32)

## 2021-05-20 LAB — PROTIME-INR
INR: 1 (ref 0.8–1.2)
Prothrombin Time: 13.6 seconds (ref 11.4–15.2)

## 2021-05-20 LAB — APTT: aPTT: 30 seconds (ref 24–36)

## 2021-05-20 IMAGING — MR MR CERVICAL SPINE WO/W CM
6 of 8 series · 30 of 48 positions shown · IV contrast (gadavist)
Comparison: [DATE]

CLINICAL DATA: Multiple sclerosis.  New event.

EXAM:
MRI CERVICAL SPINE WITHOUT AND WITH CONTRAST
TECHNIQUE: Multiplanar and multiecho pulse sequences of the cervical spine, to
include the craniocervical junction and cervicothoracic junction,
were obtained without and with intravenous contrast.
CONTRAST:  6mL GADAVIST GADOBUTROL 1 MMOL/ML IV SOLN

[Series 9: T2 · sagittal · 3.0mm · 0.69mm/px · 3 of 15 slices shown (1 of 2)]
[im 1/15]
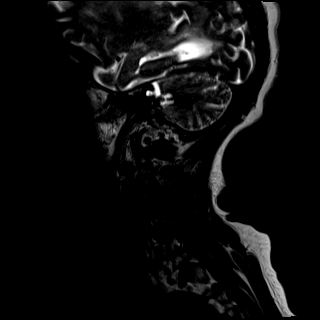
[im 8/15]
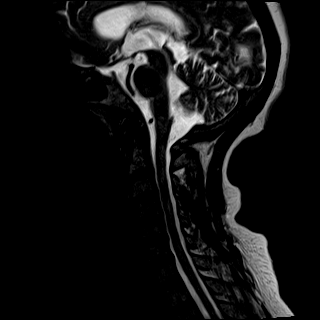
[im 15/15]
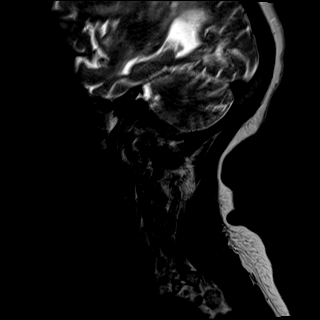

[Series 10: T1 · sagittal · 3.0mm · 0.69mm/px · 3 of 15 slices shown (1 of 2)]
[im 1/15]
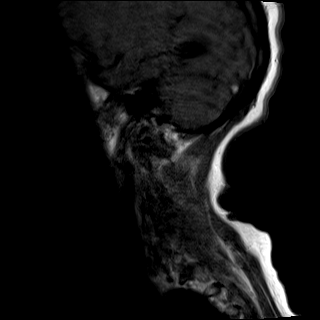
[im 8/15]
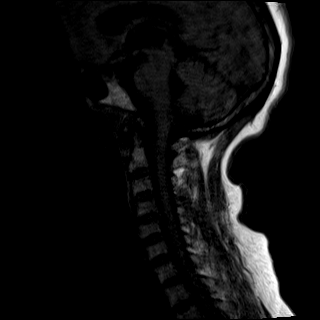
[im 15/15]
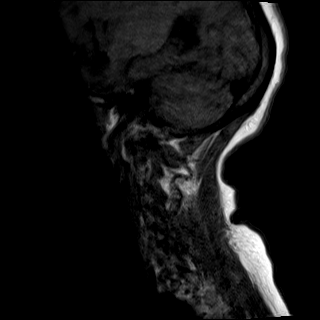

[Series 11: STIR · sagittal · 3.0mm · 0.62mm/px · 3 of 15 slices shown]
[im 1/15]
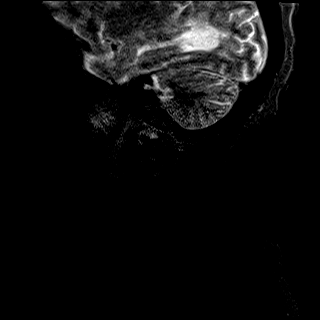
[im 8/15]
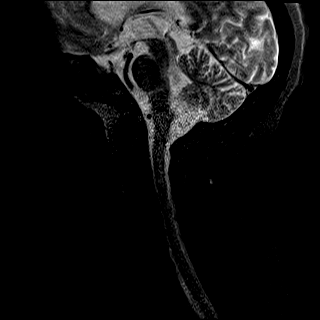
[im 15/15]
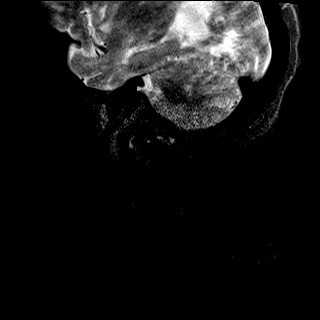

[Series 12: T2 · axial · 3.0mm · 0.66mm/px · z∈[-230,-107]mm · 9 of 40 slices shown (2 of 2)]
[im 1/40]
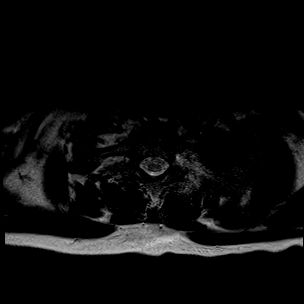
[im 5/40]
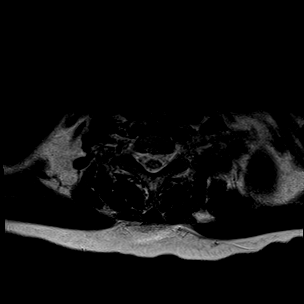
[im 10/40]
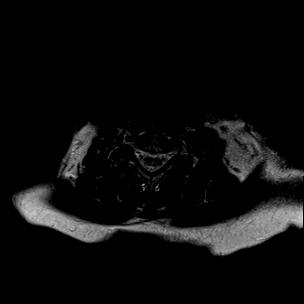
[im 15/40]
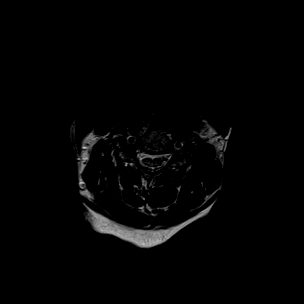
[im 20/40]
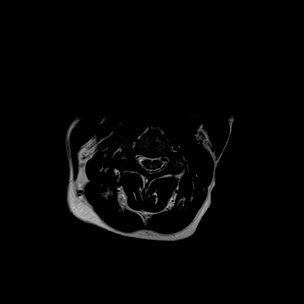
[im 25/40]
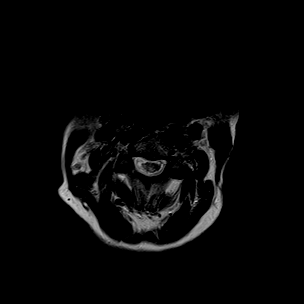
[im 30/40]
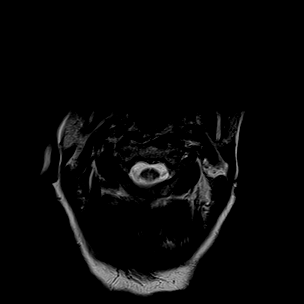
[im 35/40]
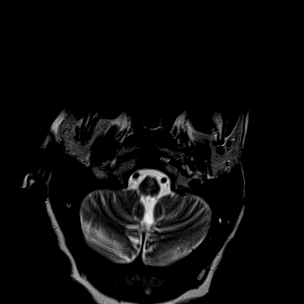
[im 40/40]
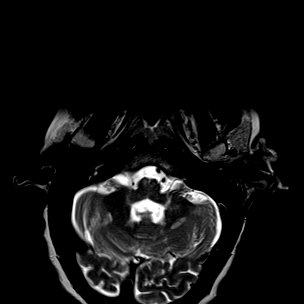

[Series 14: T1 · axial · 3.0mm · 0.39mm/px · z∈[-230,-107]mm · 9 of 40 slices shown (2 of 2)]
[im 1/40]
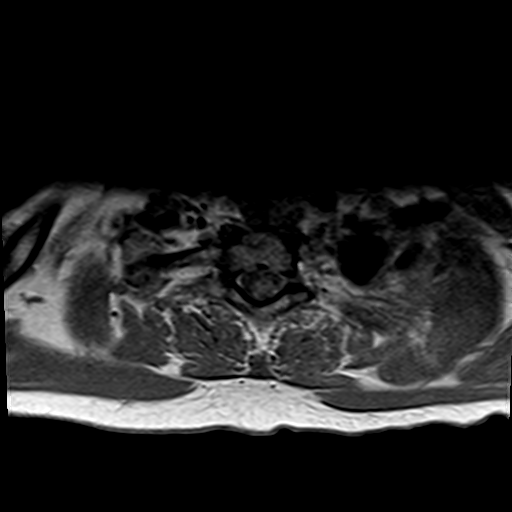
[im 5/40]
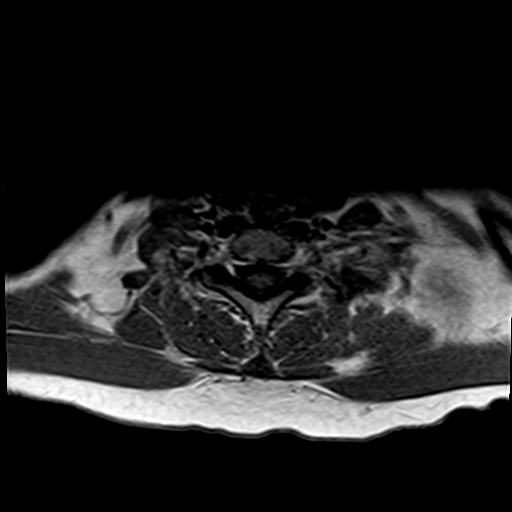
[im 10/40]
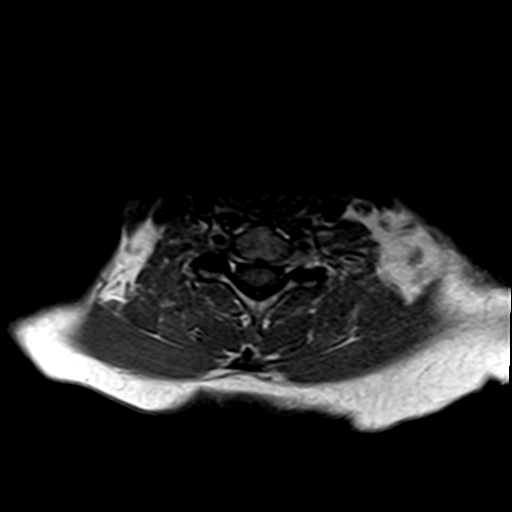
[im 15/40]
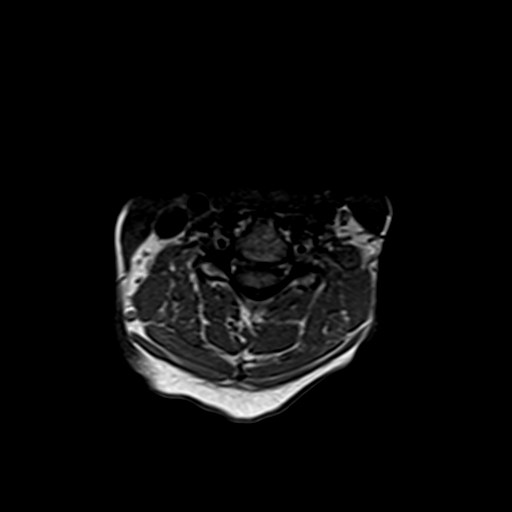
[im 20/40]
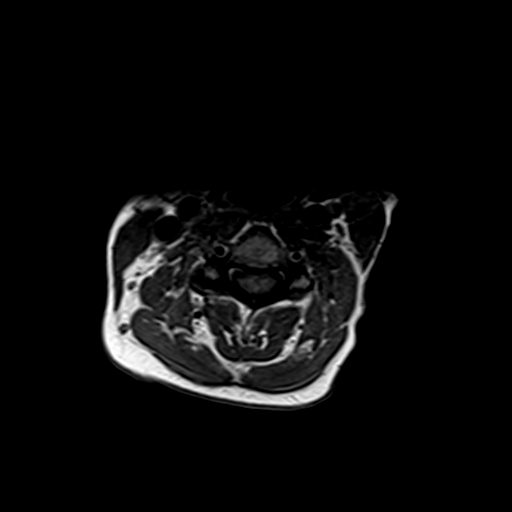
[im 25/40]
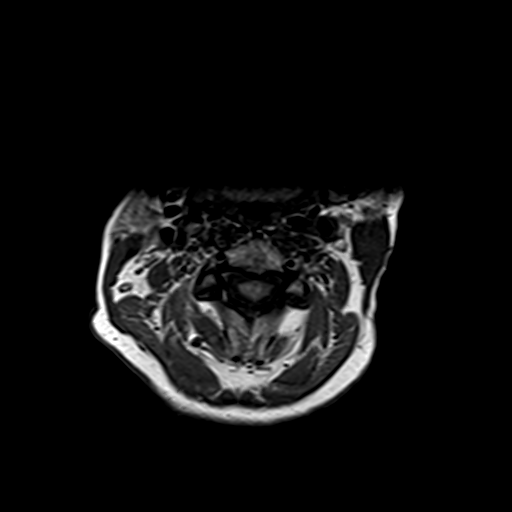
[im 30/40]
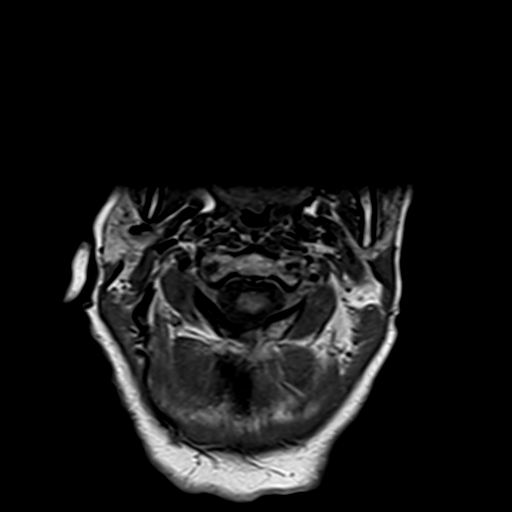
[im 35/40]
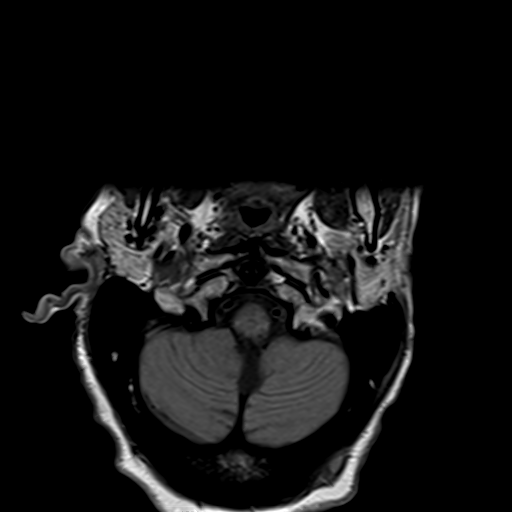
[im 40/40]
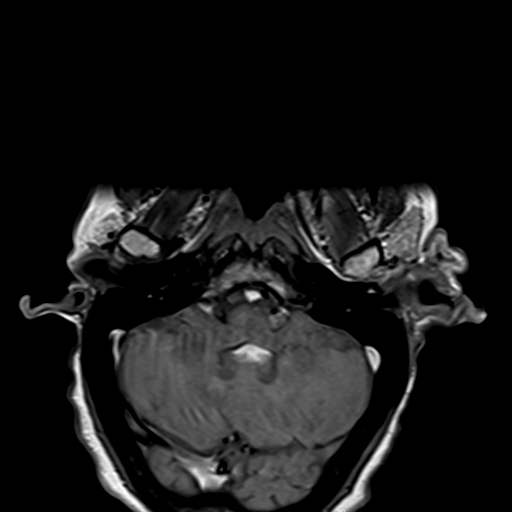

[Series 15: T1 fat-sat post-contrast · sagittal · 3.0mm · 0.43mm/px · 3 of 15 slices shown]
[im 1/15]
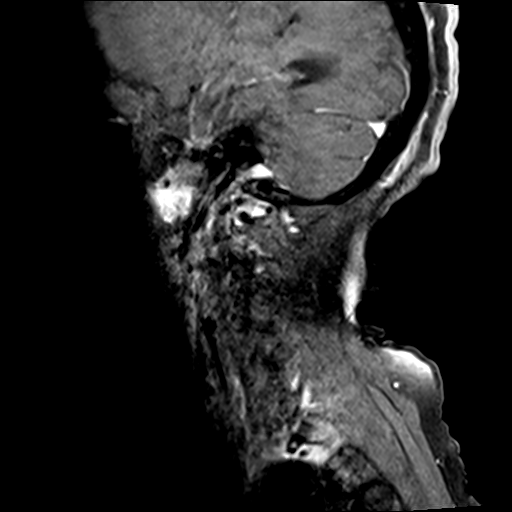
[im 8/15]
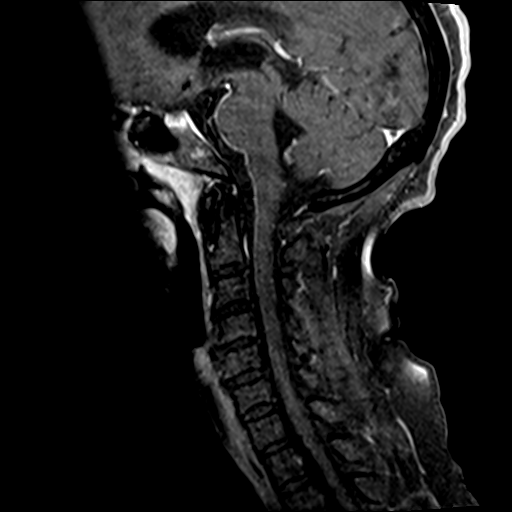
[im 15/15]
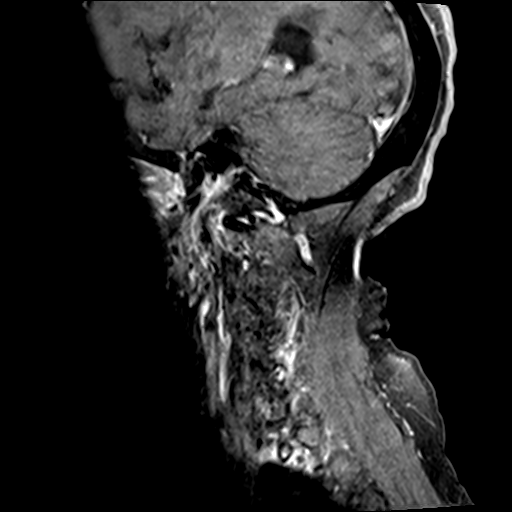

[30 of 48 positions shown; findings below may reference images not displayed]

FINDINGS: Alignment: No malalignment.

Vertebrae: No fracture or focal bone lesion.

Cord: Heterogeneous T2 and FLAIR signal affecting the craniocervical
junction in the cervical spine to the upper thoracic region. The
appearance is unchanged since the prior exam. No newly seen lesion.
No lesion shows contrast enhancement.

Posterior Fossa, vertebral arteries, paraspinal tissues: See results
of brain MRI. Paraspinal tissues of the neck appear unremarkable.

Disc levels:

Foramen magnum is widely patent. No significant finding at C1-2,
C2-3 or C3-4.

C4-5: Right foraminal stenosis due to encroachment by osteophytes
and bulging disc material could affect the right C5 nerve. No
change.

C5-6 and C6-7: Mild, non-compressive disc bulges.

C7-T1: Normal
IMPRESSION: Heterogeneous T2 and FLAIR signal affecting the craniocervical
junction and cervical spinal cord, unchanged since the study [DATE]. No new demyelinating lesion. No abnormal contrast enhancement.

Right-sided predominant spondylosis at C4-5 with right foraminal
stenosis that could affect the right C5 nerve.

## 2021-05-20 IMAGING — CT CT HEAD W/O CM
3 of 4 series · 14 of 47 positions shown, 16 images · non-contrast
Comparison: Brain MRI [DATE].  Head CT [DATE].

CLINICAL DATA: There are distally, acute, stroke suspected.
Additional history provided: Generalized weakness and slurred speech
since yesterday.

EXAM:
CT HEAD WITHOUT CONTRAST
TECHNIQUE: Contiguous axial images were obtained from the base of the skull
through the vertex without intravenous contrast.

[Series 3: head 2.0 h70h · axial · 0.43mm/px · z∈[-83,+39]mm · 8 of 77 slices shown, 10 images]
[im 8/77  brain]
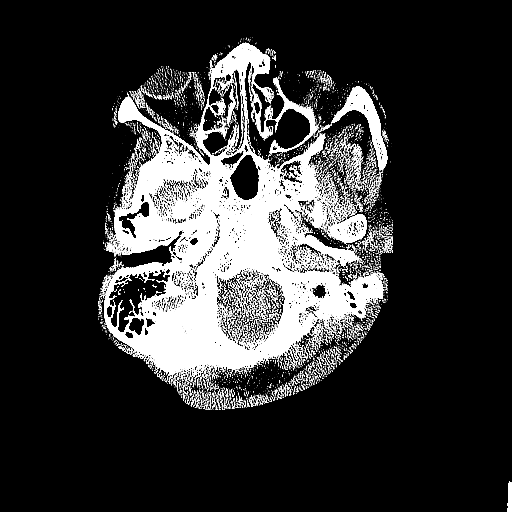
[im 8/77  bone]
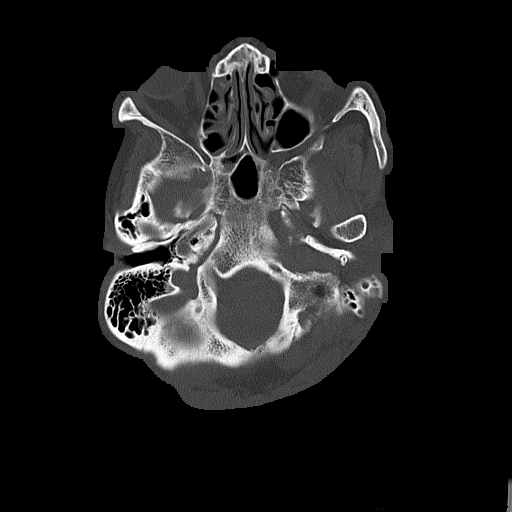
[im 16/77  brain]
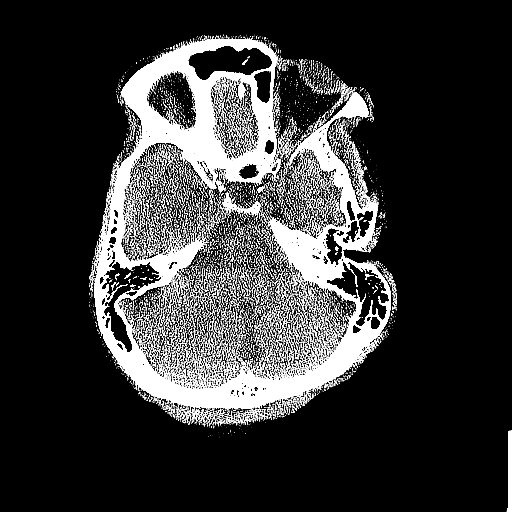
[im 23/77  brain]
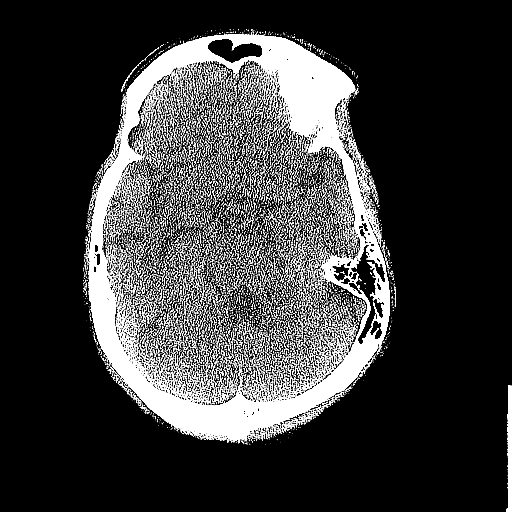
[im 35/77  brain]
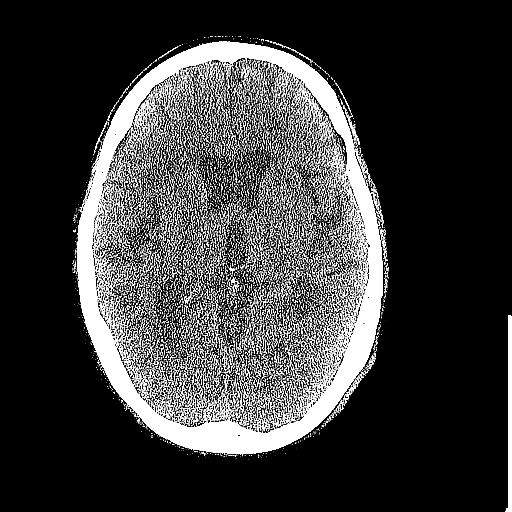
[im 42/77  brain]
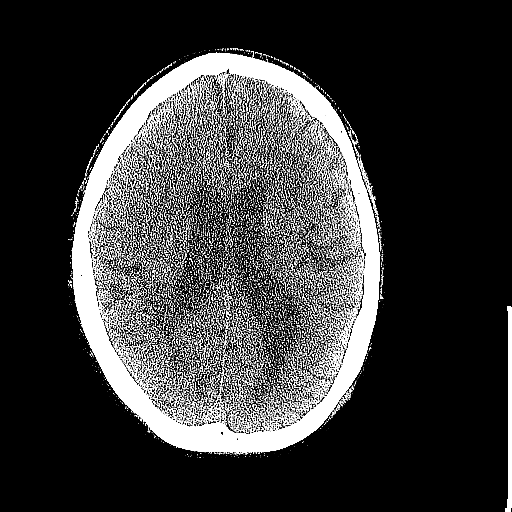
[im 42/77  bone]
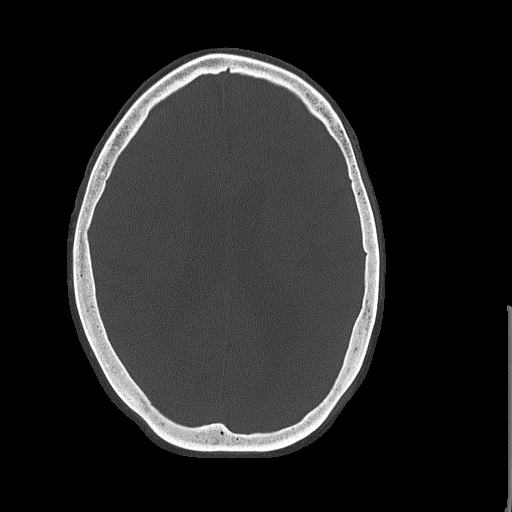
[im 54/77  brain]
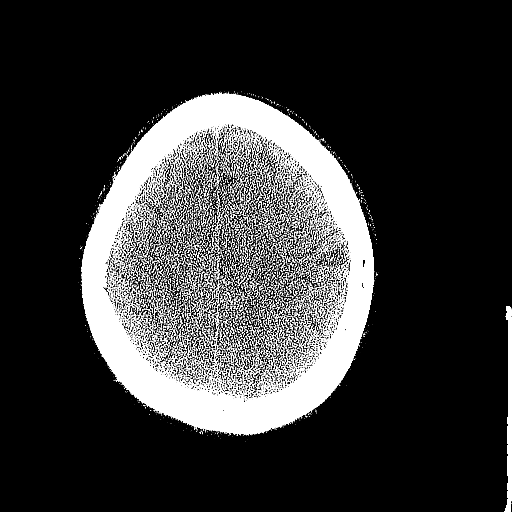
[im 61/77  brain]
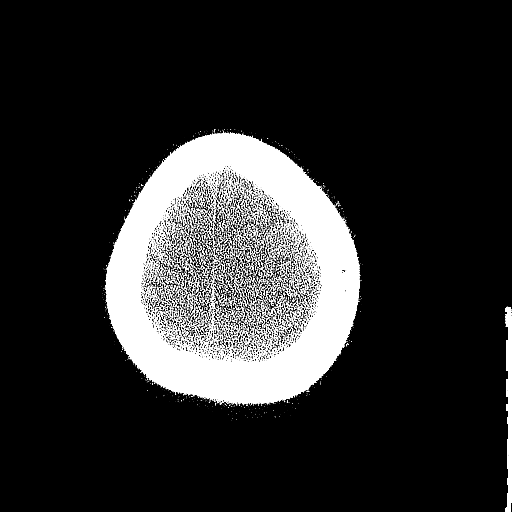
[im 69/77  brain]
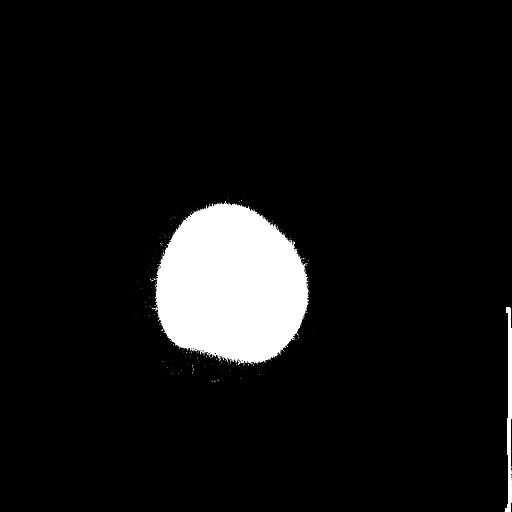

[Series 5: head 3.0 mpr cor · coronal · 0.33mm/px · 3 of 67 slices shown]
[im 23/67  brain]
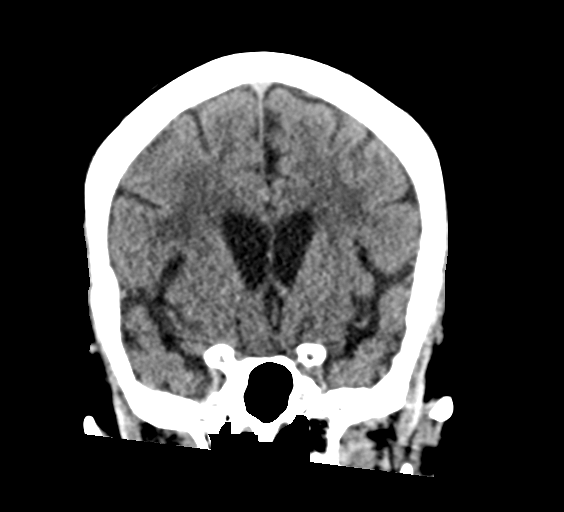
[im 30/67  brain]
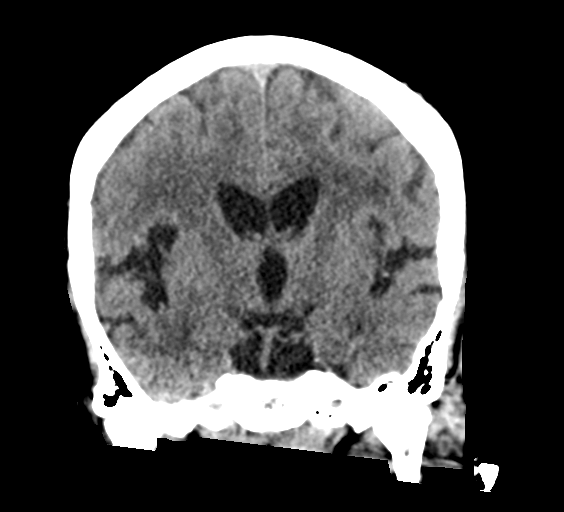
[im 37/67  brain]
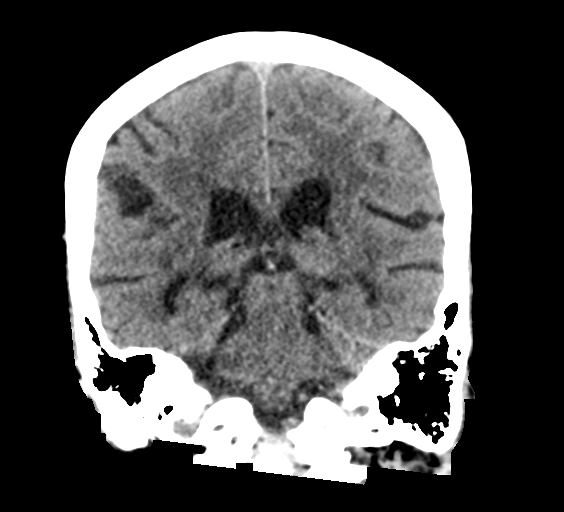

[Series 6: head 3.0 mpr sag · sagittal · 0.32mm/px · 3 of 49 slices shown]
[im 17/49  brain]
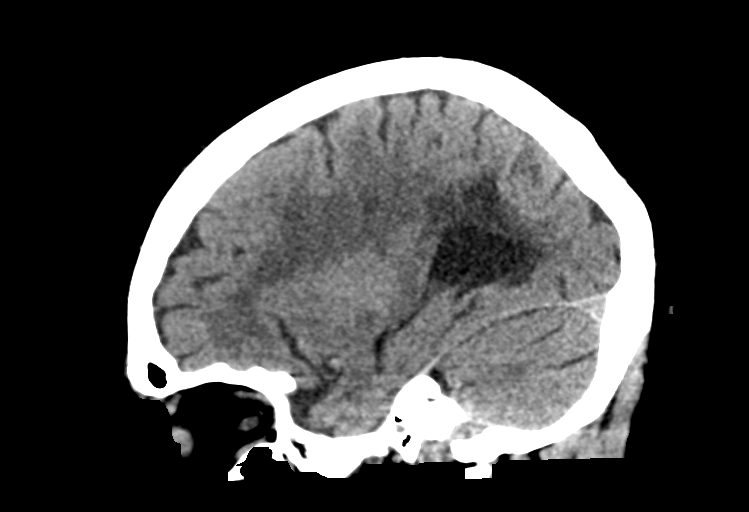
[im 25/49  brain]
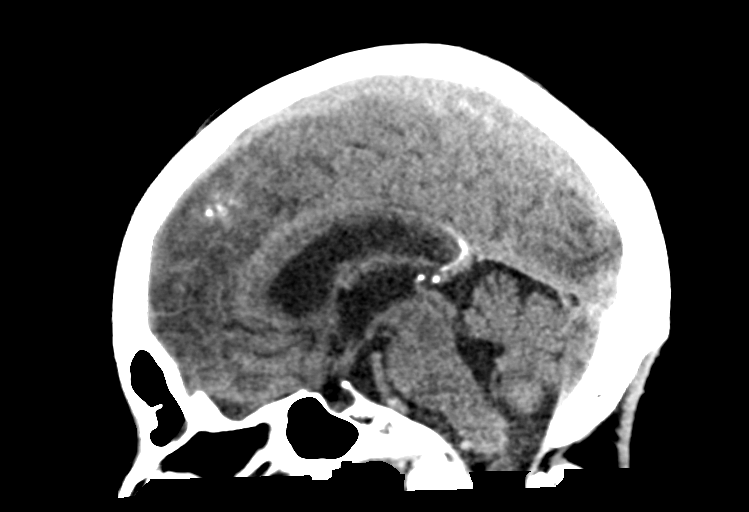
[im 33/49  brain]
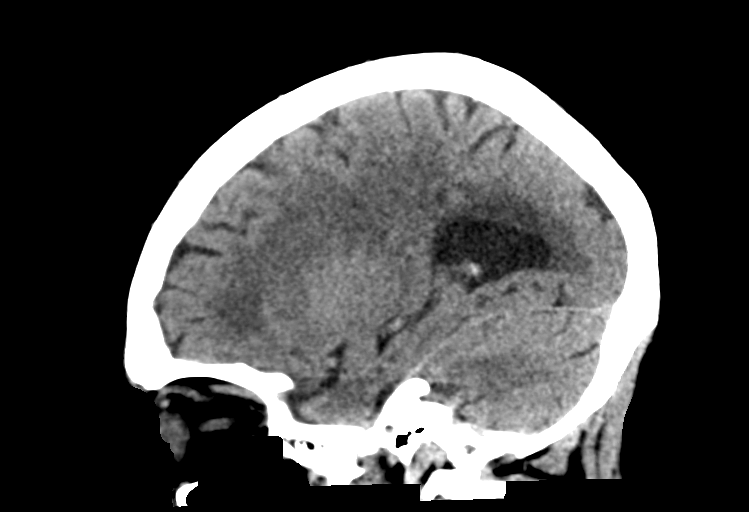

[14 of 47 positions shown; findings below may reference images not displayed]

FINDINGS: Brain:

Mild generalized cerebral atrophy.

Severe patchy and ill-defined hypoattenuation within the cerebral
white matter. Known lesions within the pons, bilateral middle
cerebellar peduncles and bilateral cerebellar hemispheres better
appreciated on the brain MRI of [DATE]. On the prior MRI, the
imaging features were characterized as most suggestive of
demyelinating disease.

There is no acute intracranial hemorrhage.

No demarcated cortical infarct.

No extra-axial fluid collection.

No evidence of an intracranial mass.

No midline shift.

Vascular: No hyperdense vessel.  Atherosclerotic calcifications.

Skull: Normal. Negative for fracture or focal lesion.

Sinuses/Orbits: Visualized orbits show no acute finding. Mild
mucosal thickening within the frontal sinuses. Mild-to-moderate
mucosal thickening within the bilateral ethmoid air cells. 6 mm
mucous retention cyst within the right sphenoid sinus. Mild mucosal
thickening within the bilateral maxillary sinuses the imaged levels.
IMPRESSION: No evidence of acute intracranial abnormality.

Redemonstrated severe chronic white matter changes, characterized as
most suggestive of sequela of demyelinating disease on the prior
brain MRI of [DATE].

Mild generalized cerebral atrophy.

Paranasal sinus disease, as described.

## 2021-05-20 IMAGING — DX DG CHEST 1V PORT
1 series · 1 of 1 positions shown · non-contrast
Comparison: Prior chest radiographs [DATE].

CLINICAL DATA: Fever. Additional provided: Fever, weakness,
question stroke.

EXAM:
PORTABLE CHEST 1 VIEW

[chest]
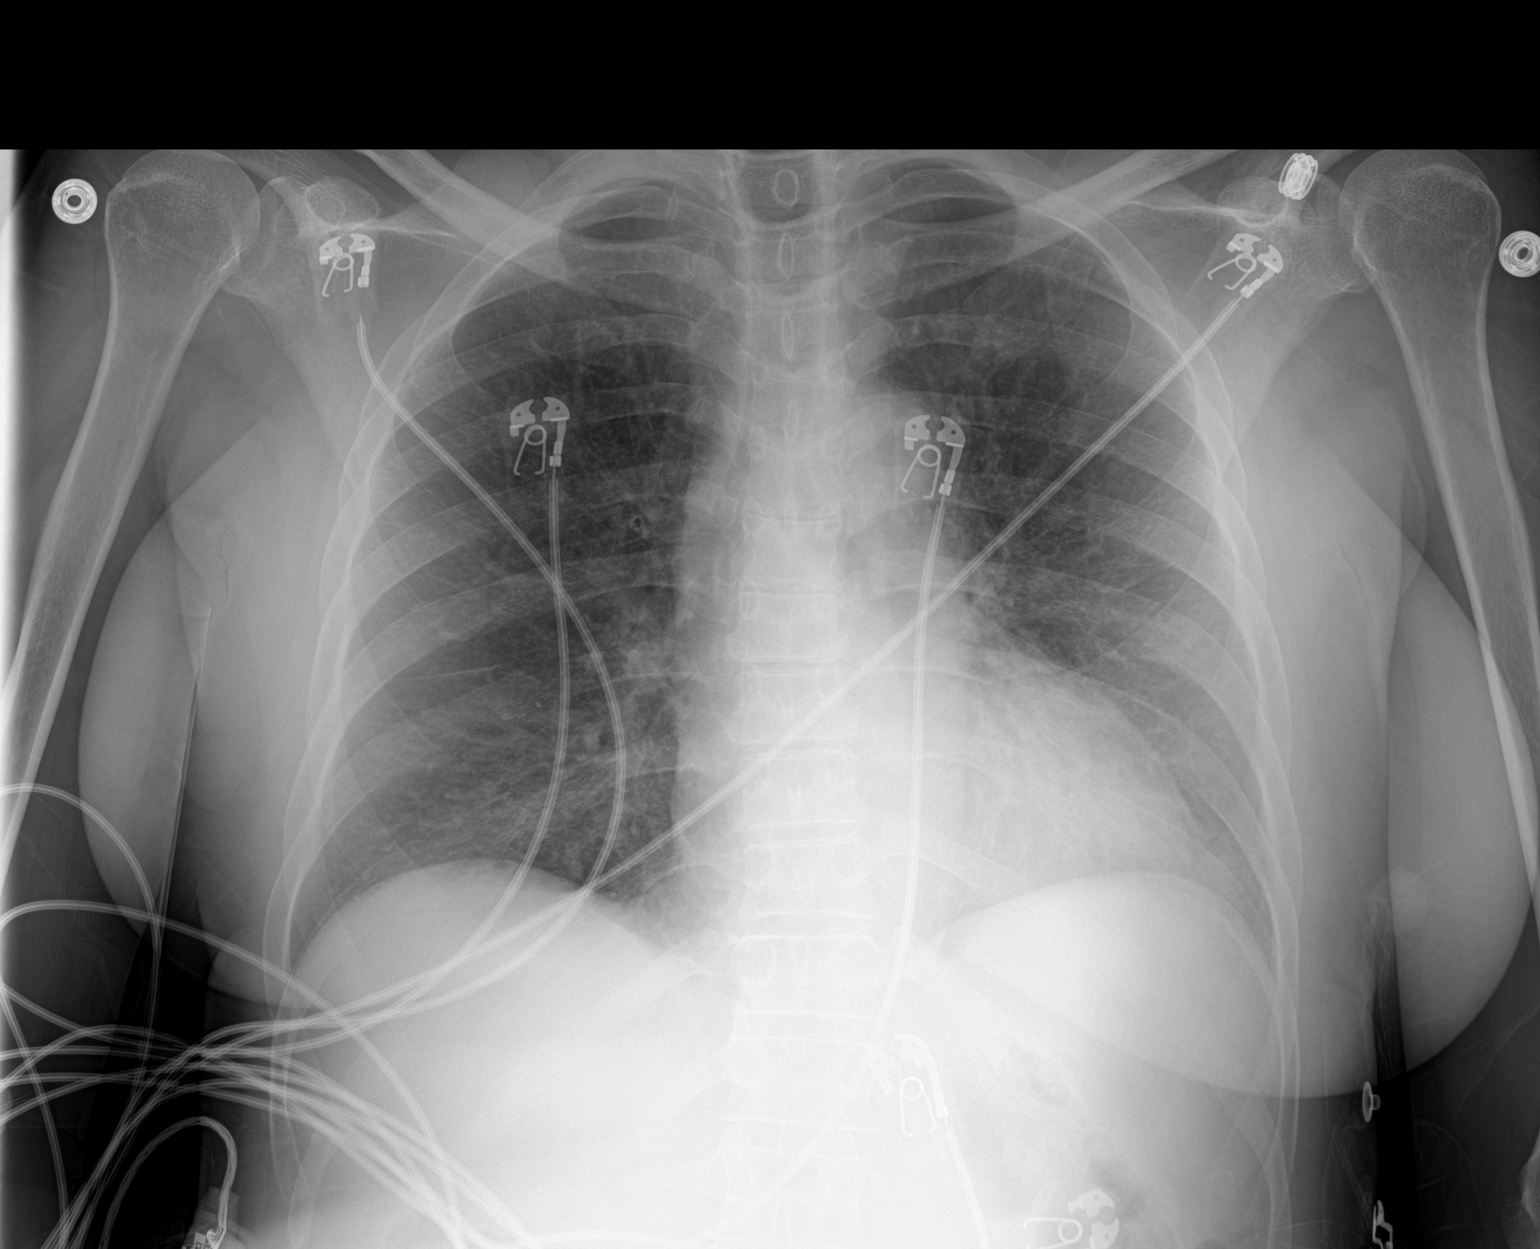

[1 of 1 positions shown; findings below may reference images not displayed]

FINDINGS: Heart size within normal limits. No appreciable airspace
consolidation. No evidence of pleural effusion or pneumothorax. No
acute bony abnormality identified. Thoracic dextrocurvature.
IMPRESSION: No evidence of active cardiopulmonary disease.

## 2021-05-20 MED ORDER — HYDRALAZINE HCL 20 MG/ML IJ SOLN
10.0000 mg | INTRAMUSCULAR | Status: AC
  Administered 2021-05-20: 10 mg via INTRAVENOUS
  Filled 2021-05-20: qty 1

## 2021-05-20 MED ORDER — SODIUM CHLORIDE 0.9 % IV SOLN
100.0000 mL/h | INTRAVENOUS | Status: DC
  Administered 2021-05-20: 100 mL/h via INTRAVENOUS

## 2021-05-20 MED ORDER — SODIUM CHLORIDE 0.9 % IV SOLN
1.0000 g | INTRAVENOUS | Status: AC
  Administered 2021-05-21 – 2021-05-22 (×2): 1 g via INTRAVENOUS
  Filled 2021-05-20 (×2): qty 10

## 2021-05-20 MED ORDER — LABETALOL HCL 5 MG/ML IV SOLN
10.0000 mg | INTRAVENOUS | Status: DC | PRN
  Administered 2021-05-20 – 2021-05-21 (×3): 10 mg via INTRAVENOUS
  Filled 2021-05-20 (×2): qty 4

## 2021-05-20 MED ORDER — ACETAMINOPHEN 325 MG PO TABS
650.0000 mg | ORAL_TABLET | Freq: Four times a day (QID) | ORAL | Status: DC | PRN
  Administered 2021-05-22: 650 mg via ORAL
  Filled 2021-05-20: qty 2

## 2021-05-20 MED ORDER — SODIUM CHLORIDE 0.9 % IV BOLUS
500.0000 mL | Freq: Once | INTRAVENOUS | Status: AC
  Administered 2021-05-20: 500 mL via INTRAVENOUS

## 2021-05-20 MED ORDER — ACETAMINOPHEN 650 MG RE SUPP: 650.0000 mg | Freq: Four times a day (QID) | RECTAL | Status: DC | PRN

## 2021-05-20 MED ORDER — DEXTROSE IN LACTATED RINGERS 5 % IV SOLN: INTRAVENOUS | Status: AC

## 2021-05-20 MED ORDER — SODIUM CHLORIDE 0.9 % IV SOLN
1.0000 g | Freq: Once | INTRAVENOUS | Status: AC
  Administered 2021-05-20: 1 g via INTRAVENOUS
  Filled 2021-05-20: qty 10

## 2021-05-20 MED ORDER — GADOBUTROL 1 MMOL/ML IV SOLN
6.0000 mL | Freq: Once | INTRAVENOUS | Status: AC | PRN
  Administered 2021-05-20: 6 mL via INTRAVENOUS

## 2021-05-20 MED ORDER — ACETAMINOPHEN 500 MG PO TABS
1000.0000 mg | ORAL_TABLET | Freq: Once | ORAL | Status: DC
  Filled 2021-05-20: qty 2

## 2021-05-20 NOTE — ED Provider Notes (Signed)
I have personally evaluated the patient at the bedside, she was informed that she needs to be admitted because of the fever of unknown origin while she is immunosuppressed.  I discussed the case with neurology who made this recommendation specifically given that she is on a B-cell immunosuppressant.  The patient is agreeable, heart rate is around 100 bpm, temperature is 101.1 and her blood pressure is elevated at close to 200/100.  I discussed the case with Dr. Toniann Fail who will come see the patient in consultation   Eber Hong, MD 05/20/21 2015

## 2021-05-20 NOTE — ED Provider Notes (Addendum)
[facility] EMERGENCY DEPARTMENT Provider Note   CSN: [id number] Arrival date & time: 05/20/21  1208     History Chief Complaint  Patient presents with   Weakness    Dana Turner is a 54 y.o. female.  HPI Patient arrives via EMS with concern of weakness, fever.  Last known normal was about 18 hours ago.  Patient notes that since that time she has developed left upper and lower extremity weakness, fever.  No clear precipitant.  Patient has history of lupus, recent diagnosis of MS for which she received antibiotic infusion.  EMS reports the patient was hypertensive in route, found to have a fever, but awake, alert, oriented appropriately. Patient denies focal pain, denies dyspnea, acknowledges weakness.    Past Medical History:  Diagnosis Date   Anxiety    Depression    Hypertension    Legally blind in left eye, as defined in [place]    Lupus [facility])     Patient Active Problem List   Diagnosis Date Noted   Multiple sclerosis (HCC) 04/23/2021   Essential hypertension 09/14/2018   History of lupus 09/14/2018   Chronic pain of right knee 09/14/2018   History of tear of ACL (anterior cruciate ligament) 09/14/2018    Past Surgical History:  Procedure Laterality Date   ABDOMINAL HYSTERECTOMY  1996   EYE SURGERY Left 2016     OB History   No obstetric history on file.     No family history on file.  Social History   Tobacco Use   Smoking status: Every Day    Packs/day: 0.50    Years: 10.00    Pack years: 5.00    Types: Cigarettes   Smokeless tobacco: Never  Substance Use Topics   Alcohol use: Yes    Alcohol/week: 1.0 standard drink    Types: 1 Glasses of wine per week   Drug use: Never    Home Medications Prior to Admission medications   Medication Sig Start Date End Date Taking? Authorizing Provider  ALPRAZolam Prudy Feeler) 1 MG tablet Take 1 mg by mouth 3 (three) times daily as needed for anxiety. Patient not taking: No sig reported     [provider]  amLODipine (NORVASC) 10 MG tablet Take 1 tablet (10 mg total) by mouth daily. 09/14/18   Georgina Quint, MD  gabapentin (NEURONTIN) 100 MG capsule Take 1 capsule (100 mg total) by mouth 3 (three) times daily. 04/02/21   Drema Dallas, DO  hydroxychloroquine (PLAQUENIL) 200 MG tablet Take 200 mg by mouth 2 (two) times daily.    [provider]  sertraline (ZOLOFT) 25 MG tablet  11/11/20   [provider]  traMADol (ULTRAM) 50 MG tablet Take 1 tablet (50 mg total) by mouth every 12 (twelve) hours as needed. 01/28/21 01/28/22  Cammy Copa, MD    Allergies    Patient has no known allergies.  Review of Systems   Review of Systems  Constitutional:        Per HPI, otherwise negative  HENT:         Per HPI, otherwise negative  Respiratory:         Per HPI, otherwise negative  Cardiovascular:        Per HPI, otherwise negative  Gastrointestinal:  Negative for vomiting.  Endocrine:       Negative aside from HPI  Genitourinary:        Neg aside from HPI   Musculoskeletal:  Per HPI, otherwise negative  Skin: Negative.   Allergic/Immunologic: Positive for immunocompromised state.       MS, lupus  Neurological:  Positive for weakness. Negative for syncope.   Physical Exam Updated Vital Signs BP (!) 195/104   Pulse 79   Temp (!) 101.1 F (38.4 C) (Oral)   Resp 18   Ht 5\' 3"  (1.6 m)   Wt 59 kg   SpO2 98%   BMI 23.03 kg/m   Physical Exam Vitals and nursing note reviewed.  Constitutional:      General: She is not in acute distress.    Appearance: She is well-developed. She is ill-appearing.  HENT:     Head: Normocephalic and atraumatic.  Eyes:     Conjunctiva/sclera: Conjunctivae normal.  Cardiovascular:     Rate and Rhythm: Regular rhythm. Tachycardia present.  Pulmonary:     Effort: Pulmonary effort is normal. No respiratory distress.     Breath sounds: Normal breath sounds. No stridor.  Abdominal:     General:  There is no distension.     Tenderness: There is no abdominal tenderness. There is no guarding.  Skin:    General: Skin is warm and dry.  Neurological:     Mental Status: She is alert and oriented to person, place, and time.     Cranial Nerves: No cranial nerve deficit.     Comments: No facial asymmetry, speech is very quiet, but clear. Left upper extremity strength 4/5, right 5/5. Bilateral lower extremity strength 3/5.    ED Results / Procedures / Treatments   Labs (all labs ordered are listed, but only abnormal results are displayed) Labs Reviewed  CBC - Abnormal; Notable for the following components:      Result Value   WBC 3.3 (*)    All other components within normal limits  DIFFERENTIAL - Abnormal; Notable for the following components:   Lymphs Abs 0.3 (*)    All other components within normal limits  I-STAT CHEM 8, ED - Abnormal; Notable for the following components:   Glucose, Bld 106 (*)    Calcium, Ion 1.05 (*)    All other components within normal limits  RESP PANEL BY RT-PCR (FLU A&B, COVID) ARPGX2  PROTIME-INR  APTT  COMPREHENSIVE METABOLIC PANEL  RAPID URINE DRUG SCREEN, HOSP PERFORMED  URINALYSIS, ROUTINE W REFLEX MICROSCOPIC    EKG EKG Interpretation  Date/Time:  Wednesday May 20 2021 12:46:11 EDT Ventricular Rate:  80 PR Interval:  136 QRS Duration: 84 QT Interval:  370 QTC Calculation: 426 R Axis:   43 Text Interpretation: Normal sinus rhythm Minimal voltage criteria for LVH, may be normal variant ( Cornell product ) Nonspecific T wave abnormality Abnormal ECG Confirmed by 01[birth year]1990 [phone]) on 05/20/2021 1:06:00 PM  Radiology CT HEAD WO CONTRAST  Result Date: 05/20/2021 CLINICAL DATA:  There are distally, acute, stroke suspected. Additional history provided: Generalized weakness and slurred speech since yesterday. EXAM: CT HEAD WITHOUT CONTRAST TECHNIQUE: Contiguous axial images were obtained from the base of the skull through the vertex  without intravenous contrast. COMPARISON:  Brain MRI 03/26/2021.  Head CT 02/06/2018. FINDINGS: Brain: Mild generalized cerebral atrophy. Severe patchy and ill-defined hypoattenuation within the cerebral white matter. Known lesions within the pons, bilateral middle cerebellar peduncles and bilateral cerebellar hemispheres better appreciated on the brain MRI of 03/26/2021. On the prior MRI, the imaging features were characterized as most suggestive of demyelinating disease. There is no acute intracranial hemorrhage. No demarcated cortical infarct. No extra-axial  fluid collection. No evidence of an intracranial mass. No midline shift. Vascular: No hyperdense vessel.  Atherosclerotic calcifications. Skull: Normal. Negative for fracture or focal lesion. Sinuses/Orbits: Visualized orbits show no acute finding. Mild mucosal thickening within the frontal sinuses. Mild-to-moderate mucosal thickening within the bilateral ethmoid air cells. 6 mm mucous retention cyst within the right sphenoid sinus. Mild mucosal thickening within the bilateral maxillary sinuses the imaged levels. IMPRESSION: No evidence of acute intracranial abnormality. Redemonstrated severe chronic white matter changes, characterized as most suggestive of sequela of demyelinating disease on the prior brain MRI of 03/26/2021. Mild generalized cerebral atrophy. Paranasal sinus disease, as described. Electronically Signed   By: Jackey Loge D.O.   On: 05/20/2021 12:59   DG Chest Port 1 View  Result Date: 05/20/2021 CLINICAL DATA:  Fever. Additional provided: Fever, weakness, question stroke. EXAM: PORTABLE CHEST 1 VIEW COMPARISON:  Prior chest radiographs 02/06/2018. FINDINGS: Heart size within normal limits. No appreciable airspace consolidation. No evidence of pleural effusion or pneumothorax. No acute bony abnormality identified. Thoracic dextrocurvature. IMPRESSION: No evidence of active cardiopulmonary disease. Electronically Signed   By: Jackey Loge  D.O.   On: 05/20/2021 13:04    Procedures Procedures   Medications Ordered in ED Medications  sodium chloride 0.9 % bolus 500 mL (has no administration in time range)    Followed by  0.9 %  sodium chloride infusion (has no administration in time range)  acetaminophen (TYLENOL) tablet 1,000 mg (has no administration in time range)    ED Course  I have reviewed the triage vital signs and the nursing notes.  Pertinent labs & imaging results that were available during my care of the patient were reviewed by me and considered in my medical decision making (see chart for details).  On arrival patient being found to be febrile, 1-1.1.  She is tachycardic, hypertensive.  Patient received Tylenol, evaluation for infection, new neurodeficits with consideration of lupus cerebritis versus MS flare versus stroke.  Absent speech deficits, facial asymmetry, passage of greater than 12 hours, evaluation expedited, patient not a code stroke.  3:54 PM I discussed the patient's presentation with our neurologist.  We discussed her recent MRI results from July of this year, ongoing antibiotic therapy.  She is noted to be on appropriate therapy for MS, which may result in leukopenia, which the patient has as a result on her initial labs.  With consideration of MS flare, less likely cerebritis, patient has MRI with and without contrast brain and C-spine pending.  No indication for intervention pending those results, the patient may be appropriate for discharge depending on findings. Other initial labs thus far reassuring. After obtaining consent I discussed results with the patient's sister.  She requests updates, as available. Suszanne Conners, [phone]) MDM Rules/Calculators/A&P Adult female with multiple sclerosis presents with new weakness since yesterday.  Patient has oriented appropriately, has a patent airway, but on exam has deficiencies, left upper, and bilateral lower extremities.  Some suspicion for  progression of disease, lower suspicion for stroke given her history, and previously noted similar distribution of weakness.  On signout the patient is awaiting MRI studies.  No early evidence for bacteremia, sepsis, though the patient's antibiotic therapy may be contributing to leukopenia.  She is not hypotensive.  On signout patient is also awaiting UA results. Final Clinical Impression(s) / ED Diagnoses Final diagnoses:  Weakness  Fever in adult     Gerhard Munch, MD 05/20/21 1559    Gerhard Munch, MD 05/20/21 1625

## 2021-05-20 NOTE — ED Triage Notes (Signed)
Pt BIB [facility] from home. Pts sister called out for possible stroke. Pt has L sided weakness and slurred speech. Per family, pt tried to get up last night and was immobile, which is not pts baseline. Pt also couldn't get out of bed this AM. LSN 1800 last PM. EMS reports fever and malodorous urine. Hx MS.   EMS VS- 102 temp, HR 86, 30 RR, CBG 123, 196/106

## 2021-05-20 NOTE — ED Notes (Signed)
Attempted to swab pt for Covid, pt threw the swab out of my hands on to the floor will try again at later point.

## 2021-05-20 NOTE — ED Notes (Signed)
Blood sugar was 122. CBG did not cross over.

## 2021-05-20 NOTE — ED Notes (Signed)
Pt not in room at this time

## 2021-05-20 NOTE — ED Notes (Signed)
Suszanne Conners sister [phone] requesting an update

## 2021-05-20 NOTE — ED Notes (Signed)
Patient transported to MRI 

## 2021-05-20 NOTE — ED Notes (Signed)
Pt urinated on herself. RN cleaned pt. Pt is clean and dry. New sheets, chucks, and diaper applied.

## 2021-05-20 NOTE — ED Notes (Signed)
Pt would not let NT swab pt. Pt kept swatting at NT

## 2021-05-20 NOTE — Plan of Care (Addendum)
Discussed briefly with Dr. Hyacinth Meeker.  This is a 55 year old woman with MS on Ocrevus, last infusion 8/31.  She presents with worsened left-sided weakness in the upper and lower extremities in the setting of a significant fever.  Given MRI brain and cervical spine are negative for acute enhancing lesion (personally reviewed current scan) and per radiology stable from prior scans, with a significant burden of MS related white matter changes, suspect pseudoflare in the setting of infection.  We discussed that her labs are likely unrevealing in the setting of her immunosuppression, and it is unclear to me whether her refusal of COVID swab as documented in nursing notes is baseline or represents some altered mental status.  Recommend further infectious work-up and admission for monitoring given she is well below her baseline level of function per notes, but do not suspect this is a primary neurological process based on data thus far.  Additionally further immunosuppression in the setting of concern for acute infection (for example pulse dose steroids) and an already immunosuppressed patient would generally be contraindicated.  Neurology will be available on an as-needed basis, please reach out for full consult if needed or if new concerns arise  Brooke Dare MD-PhD [facility] [phone] Available 7 PM to 7 AM, outside of these hours please call Neurologist on call as listed on Amion.   Addendum: Notified patient resulted COVID-positive.  No contraindication to remdesivir with her MS disease modifying therapy.  Also okay for standard COVID 19 related steroids if indicated.  Her symptoms are likely pseudo flare in the setting of COVID-19 infection and she should improve with treatment of the infection Neurology will be available on an as-needed basis, please reach out for full consult if needed or if new concerns arise

## 2021-05-20 NOTE — H&P (Signed)
History and Physical    Katee Wentland WYO:[id number] DOB: 11-20-1965 DOA: 05/20/2021  PCP: Georgina Quint, MD  Patient coming from: Home.  Chief Complaint: Left-sided weakness and fever.  HPI: Yolande Skoda is a 55 y.o. female with recent diagnosis of multiple sclerosis and received Ocrevus infusion on April 29, 2021 has been experiencing increasing left-sided weakness and fever for the last 48 hours.  Denies any headache nausea vomiting diarrhea shortness of breath chest pain or dysuria.  Patient states she usually uses a walker to walk but due to the weakness she was unable to do this.  ED Course: In the ER patient has been febrile with temperatures around 101.1 F blood pressure was more than 200 systolic MRI of the brain and C-spine was done which does not show any worsening of the patient's known history of multiple sclerosis.  Neurology was consulted and recommended work-up for fever which likely is causing her symptoms.  Blood cultures obtained.  Eventually patient's COVID test came back positive.  Patient has agreed for treatment for COVID.  Chest x-ray was unremarkable.  On exam patient appears generally weak more on the left side.  Patient has failed swallow evaluation.  Patient blood pressure has been elevated for which I ordered as needed IV labetalol.  Inflammatory markers are pending.  WBC 3.3 sodium 132 bicarb 21.  Patient also was placed on ceftriaxone for possible UTI.  Review of Systems: As per HPI, rest all negative.   Past Medical History:  Diagnosis Date   Anxiety    Depression    Hypertension    Legally blind in left eye, as defined in [place]    Lupus [facility])     Past Surgical History:  Procedure Laterality Date   ABDOMINAL HYSTERECTOMY  1996   EYE SURGERY Left 2016     reports that she has been smoking cigarettes. She has a 5.00 pack-year smoking history. She has never used smokeless tobacco. She reports current alcohol use of about 1.0 standard drink per  week. She reports that she does not use drugs.  No Known Allergies  History reviewed. No pertinent family history.  Prior to Admission medications   Medication Sig Start Date End Date Taking? Authorizing Provider  ALPRAZolam Prudy Feeler) 1 MG tablet Take 1 mg by mouth 3 (three) times daily as needed for anxiety. Patient not taking: No sig reported    [provider]  amLODipine (NORVASC) 10 MG tablet Take 1 tablet (10 mg total) by mouth daily. 09/14/18   Georgina Quint, MD  gabapentin (NEURONTIN) 100 MG capsule Take 1 capsule (100 mg total) by mouth 3 (three) times daily. 04/02/21   Drema Dallas, DO  hydroxychloroquine (PLAQUENIL) 200 MG tablet Take 200 mg by mouth 2 (two) times daily.    [provider]  sertraline (ZOLOFT) 25 MG tablet  11/11/20   [provider]  traMADol (ULTRAM) 50 MG tablet Take 1 tablet (50 mg total) by mouth every 12 (twelve) hours as needed. 01/28/21 01/28/22  Cammy Copa, MD    Physical Exam: Constitutional: Moderately built and nourished. Vitals:   05/20/21 1641 05/20/21 1646 05/20/21 1716 05/20/21 1859  BP:  (!) 180/117 (!) 180/117 (!) 188/117  Pulse: (!) 108 (!) 109 (!) 102 94  Resp:   18 16  Temp:      TempSrc:      SpO2: 98% 93% 99% 99%  Weight:      Height:       Eyes:  Anicteric no pallor. ENMT: No discharge from the ears eyes nose and mouth. Neck: No neck rigidity no mass felt. Respiratory: No rhonchi or crepitations. Cardiovascular: S1-S2 heard. Abdomen: Soft nontender bowel sound present. Musculoskeletal: No edema. Skin: No rash. Neurologic: Alert awake oriented to place person.  Has mild weakness of the left upper and lower extremities. Psychiatric: Appears normal.  Normal affect.   Labs on Admission: I have personally reviewed following labs and imaging studies  CBC: Recent Labs  Lab 05/20/21 1219 05/20/21 1235  WBC 3.3*  --   NEUTROABS 2.2  --   HGB 12.9 13.9  HCT 39.8 41.0  MCV 84.0  --   PLT  188  --    Basic Metabolic Panel: Recent Labs  Lab 05/20/21 1219 05/20/21 1235  NA 132* 135  K 3.7 3.9  CL 102 104  CO2 21*  --   GLUCOSE 107* 106*  BUN 9 11  CREATININE 0.78 0.60  CALCIUM 9.2  --    GFR: Estimated Creatinine Clearance: 66.5 mL/min (by C-G formula based on SCr of 0.6 mg/dL). Liver Function Tests: Recent Labs  Lab 05/20/21 1219  AST 19  ALT 14  ALKPHOS 71  BILITOT 0.4  PROT 7.6  ALBUMIN 3.7   No results for input(s): LIPASE, AMYLASE in the last 168 hours. No results for input(s): AMMONIA in the last 168 hours. Coagulation Profile: Recent Labs  Lab 05/20/21 1219  INR 1.0   Cardiac Enzymes: No results for input(s): CKTOTAL, CKMB, CKMBINDEX, TROPONINI in the last 168 hours. BNP (last 3 results) No results for input(s): PROBNP in the last 8760 hours. HbA1C: No results for input(s): HGBA1C in the last 72 hours. CBG: No results for input(s): GLUCAP in the last 168 hours. Lipid Profile: No results for input(s): CHOL, HDL, LDLCALC, TRIG, CHOLHDL, LDLDIRECT in the last 72 hours. Thyroid Function Tests: No results for input(s): TSH, T4TOTAL, FREET4, T3FREE, THYROIDAB in the last 72 hours. Anemia Panel: No results for input(s): VITAMINB12, FOLATE, FERRITIN, TIBC, IRON, RETICCTPCT in the last 72 hours. Urine analysis:    Component Value Date/Time   COLORURINE YELLOW 05/20/2021 1900   APPEARANCEUR CLEAR 05/20/2021 1900   LABSPEC 1.020 05/20/2021 1900   PHURINE 7.5 05/20/2021 1900   GLUCOSEU NEGATIVE 05/20/2021 1900   HGBUR NEGATIVE 05/20/2021 1900   BILIRUBINUR NEGATIVE 05/20/2021 1900   KETONESUR 15 (A) 05/20/2021 1900   PROTEINUR NEGATIVE 05/20/2021 1900   NITRITE POSITIVE (A) 05/20/2021 1900   LEUKOCYTESUR NEGATIVE 05/20/2021 1900   Sepsis Labs: @LABRCNTIP (procalcitonin:4,lacticidven:4) )No results found for this or any previous visit (from the past 240 hour(s)).   Radiological Exams on Admission: CT HEAD WO CONTRAST  Result Date:  05/20/2021 CLINICAL DATA:  There are distally, acute, stroke suspected. Additional history provided: Generalized weakness and slurred speech since yesterday. EXAM: CT HEAD WITHOUT CONTRAST TECHNIQUE: Contiguous axial images were obtained from the base of the skull through the vertex without intravenous contrast. COMPARISON:  Brain MRI 03/26/2021.  Head CT 02/06/2018. FINDINGS: Brain: Mild generalized cerebral atrophy. Severe patchy and ill-defined hypoattenuation within the cerebral white matter. Known lesions within the pons, bilateral middle cerebellar peduncles and bilateral cerebellar hemispheres better appreciated on the brain MRI of 03/26/2021. On the prior MRI, the imaging features were characterized as most suggestive of demyelinating disease. There is no acute intracranial hemorrhage. No demarcated cortical infarct. No extra-axial fluid collection. No evidence of an intracranial mass. No midline shift. Vascular: No hyperdense vessel.  Atherosclerotic calcifications. Skull: Normal. Negative for fracture  or focal lesion. Sinuses/Orbits: Visualized orbits show no acute finding. Mild mucosal thickening within the frontal sinuses. Mild-to-moderate mucosal thickening within the bilateral ethmoid air cells. 6 mm mucous retention cyst within the right sphenoid sinus. Mild mucosal thickening within the bilateral maxillary sinuses the imaged levels. IMPRESSION: No evidence of acute intracranial abnormality. Redemonstrated severe chronic white matter changes, characterized as most suggestive of sequela of demyelinating disease on the prior brain MRI of 03/26/2021. Mild generalized cerebral atrophy. Paranasal sinus disease, as described. Electronically Signed   By: Jackey Loge D.O.   On: 05/20/2021 12:59   MR Brain W and Wo Contrast  Result Date: 05/20/2021 CLINICAL DATA:  Multiple sclerosis with new symptoms. EXAM: MRI HEAD WITHOUT AND WITH CONTRAST TECHNIQUE: Multiplanar, multiecho pulse sequences of the brain  and surrounding structures were obtained without and with intravenous contrast. CONTRAST:  65mL GADAVIST GADOBUTROL 1 MMOL/ML IV SOLN COMPARISON:  Head CT same day.  MRI 03/26/2021. FINDINGS: Brain: There are widespread and extensive changes of demyelinating disease affecting the cervicomedullary junction, brainstem, cerebellum and throughout the white matter of both cerebral hemispheres. The pattern is unchanged since July of this year. No lesions show restricted diffusion or contrast enhancement. No evidence of ischemic infarction, mass lesion, hydrocephalus or extra-axial collection. Vascular: Major vessels at the base of the brain show flow. Skull and upper cervical spine: Negative Sinuses/Orbits: Clear except for mild mucosal thickening. Some divergent gaze is noted. Other: None IMPRESSION: No change appreciable since the study of March 26, 2021. Extensive widespread demyelinating changes throughout the brain as outlined above. No evidence of new or progressive lesion. No lesion shows restricted diffusion or contrast enhancement. Electronically Signed   By: Paulina Fusi M.D.   On: 05/20/2021 19:04   MR Cervical Spine W or Wo Contrast  Result Date: 05/20/2021 CLINICAL DATA:  Multiple sclerosis.  New event. EXAM: MRI CERVICAL SPINE WITHOUT AND WITH CONTRAST TECHNIQUE: Multiplanar and multiecho pulse sequences of the cervical spine, to include the craniocervical junction and cervicothoracic junction, were obtained without and with intravenous contrast. CONTRAST:  85mL GADAVIST GADOBUTROL 1 MMOL/ML IV SOLN COMPARISON:  03/11/2021 FINDINGS: Alignment: No malalignment. Vertebrae: No fracture or focal bone lesion. Cord: Heterogeneous T2 and FLAIR signal affecting the craniocervical junction in the cervical spine to the upper thoracic region. The appearance is unchanged since the prior exam. No newly seen lesion. No lesion shows contrast enhancement. Posterior Fossa, vertebral arteries, paraspinal tissues: See results  of brain MRI. Paraspinal tissues of the neck appear unremarkable. Disc levels: Foramen magnum is widely patent. No significant finding at C1-2, C2-3 or C3-4. C4-5: Right foraminal stenosis due to encroachment by osteophytes and bulging disc material could affect the right C5 nerve. No change. C5-6 and C6-7: Mild, non-compressive disc bulges. C7-T1: Normal IMPRESSION: Heterogeneous T2 and FLAIR signal affecting the craniocervical junction and cervical spinal cord, unchanged since the study of July. No new demyelinating lesion. No abnormal contrast enhancement. 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 Electronically Signed   By: Paulina Fusi M.D.   On: 05/20/2021 19:09   DG Chest Port 1 View  Result Date: 05/20/2021 CLINICAL DATA:  Fever. Additional provided: Fever, weakness, question stroke. EXAM: PORTABLE CHEST 1 VIEW COMPARISON:  Prior chest radiographs 02/06/2018. FINDINGS: Heart size within normal limits. No appreciable airspace consolidation. No evidence of pleural effusion or pneumothorax. No acute bony abnormality identified. Thoracic dextrocurvature. IMPRESSION: No evidence of active cardiopulmonary disease. Electronically Signed  By: Jackey Loge D.O.   On: 05/20/2021 13:04    EKG: Independently reviewed.  Normal sinus rhythm.  Assessment/Plan Principal Problem:   Weakness Active Problems:   Multiple sclerosis (HCC)   Hypertensive urgency   Febrile illness    COVID-19 infection likely causing patient's symptoms including weakness which as I discussed with neurologist Dr. Iver Nestle is recrudescence of multiple sclerosis due to fever from COVID infection.  Patient is agreed for remdesivir which she will start.  Patient is not hypoxic so not starting any steroids at this time and will be closely monitoring for any further worsening.  We will get physical therapy consult and also speech therapy consult since patient failed  swallow evaluation.  Inflammatory markers are pending. Hypertensive urgency not sure if patient has been taking antihypertensives.  For now I kept patient on as needed IV labetalol until patient passes her swallow evaluation. Multiple sclerosis recently received immunosuppressants on April 29, 2021.  If patient needs Actemra or baricitinib for COVID infection will need to discuss with neurology since patient recently received immunosuppressant Ocrevus. History of lupus on Plaquenil. Patient was empirically started on ceftriaxone for UTI.  May discontinue if urine cultures are negative.  Given that patient has significant weakness with febrile illness with COVID infection will need inpatient status.   DVT prophylaxis: Lovenox.  Discussed with neurologist no plan to get any lumbar puncture. Code Status: Full code. Family Communication: Discussed with patient. Disposition Plan: Home when stable. Consults called: Discussed with neurologist. Admission status: Inpatient.   Eduard Clos MD [facility] Pager [phone].  If 7PM-7AM, please contact night-coverage [email or web address] Password [facility]  05/20/2021, 10:05 PM

## 2021-05-21 DIAGNOSIS — Z79899 Other long term (current) drug therapy: Secondary | ICD-10-CM | POA: Diagnosis not present

## 2021-05-21 DIAGNOSIS — U071 COVID-19: Secondary | ICD-10-CM | POA: Diagnosis present

## 2021-05-21 DIAGNOSIS — Z90710 Acquired absence of both cervix and uterus: Secondary | ICD-10-CM | POA: Diagnosis not present

## 2021-05-21 DIAGNOSIS — H548 Legal blindness, as defined in USA: Secondary | ICD-10-CM | POA: Diagnosis present

## 2021-05-21 DIAGNOSIS — G35 Multiple sclerosis: Secondary | ICD-10-CM | POA: Diagnosis present

## 2021-05-21 DIAGNOSIS — I10 Essential (primary) hypertension: Secondary | ICD-10-CM | POA: Diagnosis present

## 2021-05-21 DIAGNOSIS — I160 Hypertensive urgency: Secondary | ICD-10-CM

## 2021-05-21 DIAGNOSIS — F32A Depression, unspecified: Secondary | ICD-10-CM | POA: Diagnosis present

## 2021-05-21 DIAGNOSIS — Z87891 Personal history of nicotine dependence: Secondary | ICD-10-CM | POA: Diagnosis not present

## 2021-05-21 DIAGNOSIS — R531 Weakness: Secondary | ICD-10-CM | POA: Diagnosis present

## 2021-05-21 DIAGNOSIS — M329 Systemic lupus erythematosus, unspecified: Secondary | ICD-10-CM | POA: Diagnosis present

## 2021-05-21 DIAGNOSIS — F419 Anxiety disorder, unspecified: Secondary | ICD-10-CM | POA: Diagnosis present

## 2021-05-21 DIAGNOSIS — E876 Hypokalemia: Secondary | ICD-10-CM | POA: Diagnosis present

## 2021-05-21 LAB — COMPREHENSIVE METABOLIC PANEL
ALT: 16 U/L (ref 0–44)
AST: 26 U/L (ref 15–41)
Albumin: 3.1 g/dL — ABNORMAL LOW (ref 3.5–5.0)
Alkaline Phosphatase: 59 U/L (ref 38–126)
Anion gap: 9 (ref 5–15)
BUN: 9 mg/dL (ref 6–20)
CO2: 21 mmol/L — ABNORMAL LOW (ref 22–32)
Calcium: 8.7 mg/dL — ABNORMAL LOW (ref 8.9–10.3)
Chloride: 104 mmol/L (ref 98–111)
Creatinine, Ser: 0.76 mg/dL (ref 0.44–1.00)
GFR, Estimated: >60 mL/min (ref >60)
Glucose, Bld: 148 mg/dL — ABNORMAL HIGH (ref 70–99)
Potassium: 3.4 mmol/L — ABNORMAL LOW (ref 3.5–5.1)
Sodium: 134 mmol/L — ABNORMAL LOW (ref 135–145)
Total Bilirubin: 0.6 mg/dL (ref 0.3–1.2)
Total Protein: 6.5 g/dL (ref 6.5–8.1)

## 2021-05-21 LAB — CBC WITH DIFFERENTIAL/PLATELET
Abs Immature Granulocytes: 0.01 10*3/uL (ref 0.00–0.07)
Abs Immature Granulocytes: 0.01 10*3/uL (ref 0.00–0.07)
Basophils Absolute: 0 10*3/uL (ref 0.0–0.1)
Basophils Absolute: 0 10*3/uL (ref 0.0–0.1)
Basophils Relative: 1 %
Basophils Relative: 0 %
Eosinophils Absolute: 0 10*3/uL (ref 0.0–0.5)
Eosinophils Absolute: 0 10*3/uL (ref 0.0–0.5)
Eosinophils Relative: 0 %
Eosinophils Relative: 0 %
HCT: 37.5 % (ref 36.0–46.0)
HCT: 37.9 % (ref 36.0–46.0)
Hemoglobin: 12.4 g/dL (ref 12.0–15.0)
Hemoglobin: 12 g/dL (ref 12.0–15.0)
Immature Granulocytes: 0 %
Immature Granulocytes: 0 %
Lymphocytes Relative: 22 %
Lymphocytes Relative: 25 %
Lymphs Abs: 0.8 10*3/uL (ref 0.7–4.0)
Lymphs Abs: 0.8 10*3/uL (ref 0.7–4.0)
MCH: 27.6 pg (ref 26.0–34.0)
MCH: 27.1 pg (ref 26.0–34.0)
MCHC: 33.1 g/dL (ref 30.0–36.0)
MCHC: 31.7 g/dL (ref 30.0–36.0)
MCV: 83.3 fL (ref 80.0–100.0)
MCV: 85.6 fL (ref 80.0–100.0)
Monocytes Absolute: 0.9 10*3/uL (ref 0.1–1.0)
Monocytes Absolute: 0.8 10*3/uL (ref 0.1–1.0)
Monocytes Relative: 24 %
Monocytes Relative: 23 %
Neutro Abs: 1.9 10*3/uL (ref 1.7–7.7)
Neutro Abs: 1.7 10*3/uL (ref 1.7–7.7)
Neutrophils Relative %: 53 %
Neutrophils Relative %: 52 %
Platelets: 165 10*3/uL (ref 150–400)
Platelets: 158 10*3/uL (ref 150–400)
RBC: 4.5 MIL/uL (ref 3.87–5.11)
RBC: 4.43 MIL/uL (ref 3.87–5.11)
RDW: 14.2 % (ref 11.5–15.5)
RDW: 14.3 % (ref 11.5–15.5)
WBC: 3.6 10*3/uL — ABNORMAL LOW (ref 4.0–10.5)
WBC: 3.2 10*3/uL — ABNORMAL LOW (ref 4.0–10.5)
nRBC: 0 % (ref 0.0–0.2)
nRBC: 0 % (ref 0.0–0.2)

## 2021-05-21 LAB — D-DIMER, QUANTITATIVE: D-Dimer, Quant: 0.44 ug/mL-FEU (ref 0.00–0.50)

## 2021-05-21 LAB — CBG MONITORING, ED
Glucose-Capillary: 128 mg/dL — ABNORMAL HIGH (ref 70–99)
Glucose-Capillary: 129 mg/dL — ABNORMAL HIGH (ref 70–99)

## 2021-05-21 LAB — HIV ANTIBODY (ROUTINE TESTING W REFLEX): HIV Screen 4th Generation wRfx: NONREACTIVE

## 2021-05-21 LAB — GLUCOSE, CAPILLARY
Glucose-Capillary: 122 mg/dL — ABNORMAL HIGH (ref 70–99)
Glucose-Capillary: 146 mg/dL — ABNORMAL HIGH (ref 70–99)

## 2021-05-21 LAB — PROCALCITONIN: Procalcitonin: <0.1 ng/mL

## 2021-05-21 LAB — C-REACTIVE PROTEIN: CRP: 0.6 mg/dL (ref <1.0)

## 2021-05-21 MED ORDER — SODIUM CHLORIDE 0.9 % IV SOLN
200.0000 mg | Freq: Once | INTRAVENOUS | Status: AC
  Administered 2021-05-21: 200 mg via INTRAVENOUS
  Filled 2021-05-21: qty 40

## 2021-05-21 MED ORDER — DEXAMETHASONE 4 MG PO TABS
6.0000 mg | ORAL_TABLET | Freq: Every day | ORAL | Status: DC
  Administered 2021-05-22 – 2021-05-24 (×3): 6 mg via ORAL
  Filled 2021-05-21 (×3): qty 2

## 2021-05-21 MED ORDER — REMDESIVIR 100 MG IV SOLR
100.0000 mg | Freq: Every day | INTRAVENOUS | Status: DC
Start: 2021-05-22 — End: 2021-05-24
  Administered 2021-05-22 – 2021-05-24 (×3): 100 mg via INTRAVENOUS
  Filled 2021-05-21 (×3): qty 20

## 2021-05-21 MED ORDER — AMLODIPINE BESYLATE 10 MG PO TABS
10.0000 mg | ORAL_TABLET | Freq: Every day | ORAL | Status: DC
  Administered 2021-05-21 – 2021-05-24 (×4): 10 mg via ORAL
  Filled 2021-05-21 (×3): qty 1
  Filled 2021-05-21: qty 2

## 2021-05-21 MED ORDER — ENOXAPARIN SODIUM 40 MG/0.4ML IJ SOSY
40.0000 mg | PREFILLED_SYRINGE | INTRAMUSCULAR | Status: DC
  Administered 2021-05-22 – 2021-05-24 (×3): 40 mg via SUBCUTANEOUS
  Filled 2021-05-21 (×3): qty 0.4

## 2021-05-21 MED ORDER — ENOXAPARIN SODIUM 30 MG/0.3ML IJ SOSY
30.0000 mg | PREFILLED_SYRINGE | INTRAMUSCULAR | Status: DC
  Administered 2021-05-21: 30 mg via SUBCUTANEOUS
  Filled 2021-05-21: qty 0.3

## 2021-05-21 MED ORDER — HYDRALAZINE HCL 20 MG/ML IJ SOLN
10.0000 mg | Freq: Four times a day (QID) | INTRAMUSCULAR | Status: DC | PRN
  Administered 2021-05-22: 10 mg via INTRAVENOUS
  Filled 2021-05-21: qty 1

## 2021-05-21 NOTE — Progress Notes (Signed)
Pt arrived to 6 North room 17 alert and oriented x4. No pain. Bed in lowest position. Call light in reach.

## 2021-05-21 NOTE — Progress Notes (Addendum)
PROGRESS NOTE    [name]  BZJ:[id number] DOB: 08-13-1966 DOA: 05/20/2021 PCP: Georgina Quint, MD   Brief Narrative:  HPI: Dana Turner is a 55 y.o. female with recent diagnosis of multiple sclerosis and received Ocrevus infusion on April 29, 2021 has been experiencing increasing left-sided weakness and fever for the last 48 hours.  Denies any headache nausea vomiting diarrhea shortness of breath chest pain or dysuria.  Patient states she usually uses a walker to walk but due to the weakness she was unable to do this.   ED Course: In the ER patient has been febrile with temperatures around 101.1 F blood pressure was more than 200 systolic MRI of the brain and C-spine was done which does not show any worsening of the patient's known history of multiple sclerosis.  Neurology was consulted and recommended work-up for fever which likely is causing her symptoms.  Blood cultures obtained.  Eventually patient's COVID test came back positive.  Patient has agreed for treatment for COVID.  Chest x-ray was unremarkable.  On exam patient appears generally weak more on the left side.  Patient has failed swallow evaluation.  Patient blood pressure has been elevated for which I ordered as needed IV labetalol.  Inflammatory markers are pending.  WBC 3.3 sodium 132 bicarb 21.  Patient also was placed on ceftriaxone for possible UTI.  Assessment & Plan:   Principal Problem:   COVID-19 Active Problems:   Multiple sclerosis (HCC)   Weakness   Hypertensive urgency   Febrile illness  Patient with history of MS now with left-sided weakness/COVID-19 infection: CT head, MRI cervical spine as well as MRI brain negative for any acute and new findings indicating MS flare.  Patient was tested COVID-positive.  Case was discussed with neurology by ED and neurology opined that her acute weakness on the left side is likely secondary to infection which is common in MS patients.  They recommended treating  COVID-19 and no further or different treatments for MS.  Patient's left-sided weakness is improving.  She has no respiratory symptoms.  She has been started on remdesivir.  I will also start her on dexamethasone.  Patient was encouraged to prone, out of bed to chair, to use incentive spirometry and flutter valve.  No indication of treating with baricitinib or Actemra.  Hypertensive urgency: Resume home dose of amlodipine and add as needed hydralazine.  History of lupus: Patient takes Plaquenil at home.  Currently on hold due to infection.  UTI: Continue Rocephin and follow culture.  Hypokalemia: We will replace.  DVT prophylaxis: enoxaparin (LOVENOX) injection 30 mg Start: 05/21/21 1000 SCDs Start: 05/20/21 2203   Code Status: Full Code  Family Communication:  None present at bedside.  Plan of care discussed with patient in length and he verbalized understanding and agreed with it.  Status is: Inpatient  Remains inpatient appropriate because:Inpatient level of care appropriate due to severity of illness  Dispo: The patient is from: Home              Anticipated d/c is to: Home              Patient currently is not medically stable to d/c.   Difficult to place patient No        Estimated body mass index is 23.03 kg/m as calculated from the following:   Height as of this encounter: 5\' 3"  (1.6 m).   Weight as of this encounter: 59 kg.     Nutritional Assessment: Body  mass index is 23.03 kg/m.Marland Kitchen Seen by dietician.  I agree with the assessment and plan as outlined below: Nutrition Status:        .  Skin Assessment: I have examined the patient's skin and I agree with the wound assessment as performed by the wound care RN as outlined below:    Consultants:  None but neurology consulted by ED  Procedures:  None  Antimicrobials:  Anti-infectives (From admission, onward)    Start     Dose/Rate Route Frequency Ordered Stop   05/22/21 1000  remdesivir 100 mg in sodium  chloride 0.9 % 100 mL IVPB       See Hyperspace for full Linked Orders Report.   100 mg 200 mL/hr over 30 Minutes Intravenous Daily 05/21/21 0419 05/26/21 0959   05/21/21 1800  cefTRIAXone (ROCEPHIN) 1 g in sodium chloride 0.9 % 100 mL IVPB        1 g 200 mL/hr over 30 Minutes Intravenous Every 24 hours 05/20/21 2207     05/21/21 0600  remdesivir 200 mg in sodium chloride 0.9% 250 mL IVPB       See Hyperspace for full Linked Orders Report.   200 mg 580 mL/hr over 30 Minutes Intravenous Once 05/21/21 0419 05/21/21 0631   05/20/21 2015  cefTRIAXone (ROCEPHIN) 1 g in sodium chloride 0.9 % 100 mL IVPB        1 g 200 mL/hr over 30 Minutes Intravenous  Once 05/20/21 2005 05/20/21 2121          Subjective: Patient seen and examined.  She states that she feels better than yesterday.  She tells me that the right upper and lower extremity weakness that she currently has is at her baseline.  She is slightly more weakness on the left side which is not at baseline but improved compared to yesterday.  She has no respiratory symptoms.  Objective: Vitals:   05/21/21 0600 05/21/21 0656 05/21/21 0945 05/21/21 1019  BP: (!) 190/100 (!) 181/93 (!) 169/91   Pulse: 70 72 (!) 58   Resp: 18 16 16    Temp: 100.1 F (37.8 C)   98.6 F (37 C)  TempSrc: Oral   Oral  SpO2: 97% 99% 99%   Weight:      Height:        Intake/Output Summary (Last 24 hours) at 05/21/2021 1049 Last data filed at 05/21/2021 0631 Gross per 24 hour  Intake 1488.04 ml  Output 350 ml  Net 1138.04 ml   Filed Weights   05/20/21 1213  Weight: 59 kg    Examination:  General exam: Appears calm and comfortable  Respiratory system: Clear to auscultation. Respiratory effort normal. Cardiovascular system: S1 & S2 heard, RRR. No JVD, murmurs, rubs, gallops or clicks. No pedal edema. Gastrointestinal system: Abdomen is nondistended, soft and nontender. No organomegaly or masses felt. Normal bowel sounds heard. Central nervous  system: Alert and oriented. No focal neurological deficits. Extremities: 4/5 power in all 4 extremities Skin: No rashes, lesions or ulcers Psychiatry: Judgement and insight appear normal. Mood & affect appropriate.    Data Reviewed: I have personally reviewed following labs and imaging studies  CBC: Recent Labs  Lab 05/20/21 1219 05/20/21 1235 05/21/21 0520 05/21/21 0900  WBC 3.3*  --  3.6* 3.2*  NEUTROABS 2.2  --  1.9 1.7  HGB 12.9 13.9 12.4 12.0  HCT 39.8 41.0 37.5 37.9  MCV 84.0  --  83.3 85.6  PLT 188  --  165 158  Basic Metabolic Panel: Recent Labs  Lab 05/20/21 1219 05/20/21 1235  NA 132* 135  K 3.7 3.9  CL 102 104  CO2 21*  --   GLUCOSE 107* 106*  BUN 9 11  CREATININE 0.78 0.60  CALCIUM 9.2  --    GFR: Estimated Creatinine Clearance: 66.5 mL/min (by C-G formula based on SCr of 0.6 mg/dL). Liver Function Tests: Recent Labs  Lab 05/20/21 1219  AST 19  ALT 14  ALKPHOS 71  BILITOT 0.4  PROT 7.6  ALBUMIN 3.7   No results for input(s): LIPASE, AMYLASE in the last 168 hours. No results for input(s): AMMONIA in the last 168 hours. Coagulation Profile: Recent Labs  Lab 05/20/21 1219  INR 1.0   Cardiac Enzymes: No results for input(s): CKTOTAL, CKMB, CKMBINDEX, TROPONINI in the last 168 hours. BNP (last 3 results) No results for input(s): PROBNP in the last 8760 hours. HbA1C: No results for input(s): HGBA1C in the last 72 hours. CBG: Recent Labs  Lab 05/21/21 0122 05/21/21 0629  GLUCAP 128* 129*   Lipid Profile: No results for input(s): CHOL, HDL, LDLCALC, TRIG, CHOLHDL, LDLDIRECT in the last 72 hours. Thyroid Function Tests: No results for input(s): TSH, T4TOTAL, FREET4, T3FREE, THYROIDAB in the last 72 hours. Anemia Panel: No results for input(s): VITAMINB12, FOLATE, FERRITIN, TIBC, IRON, RETICCTPCT in the last 72 hours. Sepsis Labs: Recent Labs  Lab 05/21/21 0520  PROCALCITON <0.10    Recent Results (from the past 240 hour(s))  Resp  Panel by RT-PCR (Flu A&B, Covid) Nasopharyngeal Swab     Status: Abnormal   Collection Time: 05/20/21  8:13 PM   Specimen: Nasopharyngeal Swab; Nasopharyngeal(NP) swabs in vial transport medium  Result Value Ref Range Status   SARS Coronavirus 2 by RT PCR POSITIVE (A) NEGATIVE Final    Comment: RESULT CALLED TO, READ BACK BY AND VERIFIED WITH: RN TORY GLASSON 05/20/21@22 :43 BY TW (NOTE) SARS-CoV-2 target nucleic acids are DETECTED.  The SARS-CoV-2 RNA is generally detectable in upper respiratory specimens during the acute phase of infection. Positive results are indicative of the presence of the identified virus, but do not rule out bacterial infection or co-infection with other pathogens not detected by the test. Clinical correlation with patient history and other diagnostic information is necessary to determine patient infection status. The expected result is Negative.  Fact Sheet for Patients: BloggerCourse.com  Fact Sheet for Healthcare Providers: SeriousBroker.it  This test is not yet approved or cleared by the [place] FDA and  has been authorized for detection and/or diagnosis of SARS-CoV-2 by FDA under an Emergency Use Authorization (EUA).  This EUA will remain in effect (meaning this test can  be used) for the duration of  the COVID-19 declaration under Section 564(b)(1) of the Act, 21 U.S.C. section 360bbb-3(b)(1), unless the authorization is terminated or revoked sooner.     Influenza A by PCR NEGATIVE NEGATIVE Final   Influenza B by PCR NEGATIVE NEGATIVE Final    Comment: (NOTE) The Xpert Xpress SARS-CoV-2/FLU/RSV plus assay is intended as an aid in the diagnosis of influenza from Nasopharyngeal swab specimens and should not be used as a sole basis for treatment. Nasal washings and aspirates are unacceptable for Xpert Xpress SARS-CoV-2/FLU/RSV testing.  Fact Sheet for  Patients: BloggerCourse.com  Fact Sheet for Healthcare Providers: SeriousBroker.it  This test is not yet approved or cleared by the [place] FDA and has been authorized for detection and/or diagnosis of SARS-CoV-2 by FDA under an Emergency Use Authorization (EUA). This  EUA will remain in effect (meaning this test can be used) for the duration of the COVID-19 declaration under Section 564(b)(1) of the Act, 21 U.S.C. section 360bbb-3(b)(1), unless the authorization is terminated or revoked.  Performed at [facility], [address], [place], [place] [address]       Radiology Studies: CT HEAD WO CONTRAST  Result Date: 05/20/2021 CLINICAL DATA:  There are distally, acute, stroke suspected. Additional history provided: Generalized weakness and slurred speech since yesterday. EXAM: CT HEAD WITHOUT CONTRAST TECHNIQUE: Contiguous axial images were obtained from the Turner of the skull through the vertex without intravenous contrast. COMPARISON:  Brain MRI 03/26/2021.  Head CT 02/06/2018. FINDINGS: Brain: Mild generalized cerebral atrophy. Severe patchy and ill-defined hypoattenuation within the cerebral white matter. Known lesions within the pons, bilateral middle cerebellar peduncles and bilateral cerebellar hemispheres better appreciated on the brain MRI of 03/26/2021. On the prior MRI, the imaging features were characterized as most suggestive of demyelinating disease. There is no acute intracranial hemorrhage. No demarcated cortical infarct. No extra-axial fluid collection. No evidence of an intracranial mass. No midline shift. Vascular: No hyperdense vessel.  Atherosclerotic calcifications. Skull: Normal. Negative for fracture or focal lesion. Sinuses/Orbits: Visualized orbits show no acute finding. Mild mucosal thickening within the frontal sinuses. Mild-to-moderate mucosal thickening within the bilateral ethmoid air cells. 6 mm mucous  retention cyst within the right sphenoid sinus. Mild mucosal thickening within the bilateral maxillary sinuses the imaged levels. IMPRESSION: No evidence of acute intracranial abnormality. Redemonstrated severe chronic white matter changes, characterized as most suggestive of sequela of demyelinating disease on the prior brain MRI of 03/26/2021. Mild generalized cerebral atrophy. Paranasal sinus disease, as described. Electronically Signed   By: Jackey Loge D.O.   On: 05/20/2021 12:59   MR Brain W and Wo Contrast  Result Date: 05/20/2021 CLINICAL DATA:  Multiple sclerosis with new symptoms. EXAM: MRI HEAD WITHOUT AND WITH CONTRAST TECHNIQUE: Multiplanar, multiecho pulse sequences of the brain and surrounding structures were obtained without and with intravenous contrast. CONTRAST:  49mL GADAVIST GADOBUTROL 1 MMOL/ML IV SOLN COMPARISON:  Head CT same day.  MRI 03/26/2021. FINDINGS: Brain: There are widespread and extensive changes of demyelinating disease affecting the cervicomedullary junction, brainstem, cerebellum and throughout the white matter of both cerebral hemispheres. The pattern is unchanged since July of this year. No lesions show restricted diffusion or contrast enhancement. No evidence of ischemic infarction, mass lesion, hydrocephalus or extra-axial collection. Vascular: Major vessels at the Turner of the brain show flow. Skull and upper cervical spine: Negative Sinuses/Orbits: Clear except for mild mucosal thickening. Some divergent gaze is noted. Other: None IMPRESSION: No change appreciable since the study of March 26, 2021. Extensive widespread demyelinating changes throughout the brain as outlined above. No evidence of new or progressive lesion. No lesion shows restricted diffusion or contrast enhancement. Electronically Signed   By: Paulina Fusi M.D.   On: 05/20/2021 19:04   MR Cervical Spine W or Wo Contrast  Result Date: 05/20/2021 CLINICAL DATA:  Multiple sclerosis.  New event. EXAM: MRI  CERVICAL SPINE WITHOUT AND WITH CONTRAST TECHNIQUE: Multiplanar and multiecho pulse sequences of the cervical spine, to include the craniocervical junction and cervicothoracic junction, were obtained without and with intravenous contrast. CONTRAST:  62mL GADAVIST GADOBUTROL 1 MMOL/ML IV SOLN COMPARISON:  03/11/2021 FINDINGS: Alignment: No malalignment. Vertebrae: No fracture or focal bone lesion. Cord: Heterogeneous T2 and FLAIR signal affecting the craniocervical junction in the cervical spine to the upper thoracic region. The appearance  is unchanged since the prior exam. No newly seen lesion. No lesion shows contrast enhancement. Posterior Fossa, vertebral arteries, paraspinal tissues: See results of brain MRI. Paraspinal tissues of the neck appear unremarkable. Disc levels: Foramen magnum is widely patent. No significant finding at C1-2, C2-3 or C3-4. C4-5: Right foraminal stenosis due to encroachment by osteophytes and bulging disc material could affect the right C5 nerve. No change. C5-6 and C6-7: Mild, non-compressive disc bulges. C7-T1: Normal IMPRESSION: Heterogeneous T2 and FLAIR signal affecting the craniocervical junction and cervical spinal cord, unchanged since the study of July. No new demyelinating lesion. No abnormal contrast enhancement. 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 Electronically Signed   By: Paulina Fusi M.D.   On: 05/20/2021 19:09   DG Chest Port 1 View  Result Date: 05/20/2021 CLINICAL DATA:  Fever. Additional provided: Fever, weakness, question stroke. EXAM: PORTABLE CHEST 1 VIEW COMPARISON:  Prior chest radiographs 02/06/2018. FINDINGS: Heart size within normal limits. No appreciable airspace consolidation. No evidence of pleural effusion or pneumothorax. No acute bony abnormality identified. Thoracic dextrocurvature. IMPRESSION: No evidence of active cardiopulmonary disease. Electronically Signed   By: Jackey Loge D.O.   On: 05/20/2021 13:04    Scheduled Meds:  acetaminophen  1,000 mg Oral Once   amLODipine  10 mg Oral Daily   enoxaparin (LOVENOX) injection  30 mg Subcutaneous Q24H   Continuous Infusions:  cefTRIAXone (ROCEPHIN)  IV     dextrose 5% lactated ringers 75 mL/hr at 05/21/21 0624   [START ON 05/22/2021] remdesivir 100 mg in NS 100 mL       LOS: 0 days   Time spent: 35 minutes   Hughie Closs, MD [facility]  05/21/2021, 10:49 AM  Please page via Loretha Stapler and do not message via secure chat for anything urgent. Secure chat can be used for anything non urgent.  How to contact the [facility] Attending or Consulting provider 7A - 7P or covering provider during after hours 7P -7A, for this patient?  Check the care team in [facility] and look for a) attending/consulting [facility] provider listed and b) the [facility] team listed. Page or secure chat 7A-7P. Log into [email or web address] and use [place]'s universal password to access. If you do not have the password, please contact the hospital operator. Locate the [facility] provider you are looking for under [facility] and page to a number that you can be directly reached. If you still have difficulty reaching the provider, please page the [facility] (Director on Call) for the Hospitalists listed on amion for assistance.

## 2021-05-21 NOTE — Evaluation (Signed)
Physical Therapy Evaluation Patient Details Name: Dana Turner MRN: [id number] DOB: [birth year] Today's Date: 05/21/2021  History of Present Illness  Dana Turner is a 55 y.o. female with recent diagnosis of multiple sclerosis and received Ocrevus infusion on April 29, 2021 has been experiencing increasing left-sided weakness and fever for the last 48 hours. MRI of the brain and C-spine do not show any worsening of the patient's known history of multiple sclerosis. COVID test came back positive. Chest x-ray was unremarkable  Clinical Impression  Patient presents with decreased mobility due to generalized weakness, poor activity tolerance, decreased balance, L side weakness and poor safety awareness.  She lives with her sister and usually ambulates with rollator on her own and navigates stairs with railings on her own.  Has had 3 recent falls, but no injury.  Sister helps with showering and IADL's.  She was able to get up and ambulate a few feet with RW to chair with mod A.  Feel she may need SNF level rehab, though patient currently prefers to go home.  PT will continue to follow acutely.        Recommendations for follow up therapy are one component of a multi-disciplinary discharge planning process, led by the attending physician.  Recommendations may be updated based on patient status, additional functional criteria and insurance authorization.  Follow Up Recommendations SNF;Supervision/Assistance - 24 hour    Equipment Recommendations  None recommended by PT    Recommendations for Other Services       Precautions / Restrictions Precautions Precautions: Fall Precaution Comments: reports 2-3 falls in past 6 months      Mobility  Bed Mobility Overal bed mobility: Needs Assistance Bed Mobility: Supine to Sit     Supine to sit: Min assist     General bed mobility comments: using rails, some lifting help for trunk    Transfers Overall transfer level: Needs  assistance Equipment used: Rolling walker (2 wheeled) Transfers: Sit to/from Stand Sit to Stand: Mod assist         General transfer comment: lifting help to stand, pt pulling up on RW  Ambulation/Gait Ambulation/Gait assistance: Mod assist Gait Distance (Feet): 3 Feet Assistive device: Rolling walker (2 wheeled) Gait Pattern/deviations: Step-to pattern;Decreased stride length;Shuffle;Ataxic;Decreased dorsiflexion - left;Decreased dorsiflexion - right     General Gait Details: dragging feet, assist for safety as pushing walker forward and pt relates weakness unable to go further than to chair  Stairs            Wheelchair Mobility    Modified Rankin (Stroke Patients Only)       Balance Overall balance assessment: Needs assistance   Sitting balance-Leahy Scale: Poor Sitting balance - Comments: able to sit with S once oriented to upright, still supporting self with UE's but lifting feet for assist to don socks   Standing balance support: Bilateral upper extremity supported Standing balance-Leahy Scale: Poor Standing balance comment: UE support and min to mod A for balance                             Pertinent Vitals/Pain Pain Assessment: Faces Faces Pain Scale: Hurts little more Pain Location: generalized Pain Descriptors / Indicators: Aching Pain Intervention(s): Monitored during session;Repositioned;Limited activity within patient's tolerance    Home Living Family/patient expects to be discharged to:: Private residence Living Arrangements: Other relatives (sister) Available Help at Discharge: Family;Available 24 hours/day Type of Home: House Home Access: Stairs to enter  Entrance Stairs-Rails: Right;Left Entrance Stairs-Number of Steps: 4 Home Layout: Two level;Bed/bath upstairs Home Equipment: Walker - 4 wheels;Shower seat Additional Comments: has 2 walkers, one up and one down    Prior Function Level of Independence: Needs assistance    Gait / Transfers Assistance Needed: independent with rollator  ADL's / Homemaking Assistance Needed: sister helps with bathing/dressing        Hand Dominance        Extremity/Trunk Assessment   Upper Extremity Assessment Upper Extremity Assessment: LUE deficits/detail;RUE deficits/detail RUE Deficits / Details: ataxic movement, has antigravity strength RUE Coordination: decreased gross motor;decreased fine motor LUE Deficits / Details: ataxic and decreased grip compared to R LUE Coordination: decreased gross motor;decreased fine motor    Lower Extremity Assessment Lower Extremity Assessment: RLE deficits/detail;LLE deficits/detail RLE Deficits / Details: AROM WFL, strength hip flexion 3/5, knee extension 4-/5, ankle DF 3+/5 RLE Sensation: decreased light touch (tingling) RLE Coordination: decreased gross motor LLE Deficits / Details: AAROM WFL, strength hip flexion 3-/5, knee extension 3+/5, ankle DF 2-/5 LLE Sensation: decreased light touch (tingling) LLE Coordination: decreased gross motor       Communication   Communication: Expressive difficulties (dysarthric/ataxic)  Cognition Arousal/Alertness: Awake/alert Behavior During Therapy: WFL for tasks assessed/performed Overall Cognitive Status: Impaired/Different from baseline (may be at her baseline, no family present) Area of Impairment: Orientation;Problem solving;Following commands                 Orientation Level: Disoriented to;Time     Following Commands: Follows one step commands consistently;Follows one step commands with increased time     Problem Solving: Slow processing        General Comments      Exercises     Assessment/Plan    PT Assessment Patient needs continued PT services  PT Problem List Decreased strength;Decreased mobility;Decreased activity tolerance;Decreased balance;Decreased knowledge of use of DME;Decreased knowledge of precautions;Decreased safety awareness        PT Treatment Interventions DME instruction;Therapeutic exercise;Gait training;Balance training;Stair training;Functional mobility training;Cognitive remediation;Therapeutic activities;Patient/family education    PT Goals (Current goals can be found in the Care Plan section)  Acute Rehab PT Goals Patient Stated Goal: to go home PT Goal Formulation: With patient Time For Goal Achievement: 06/04/21 Potential to Achieve Goals: Good    Frequency Min 3X/week   Barriers to discharge        Co-evaluation               AM-PAC PT "6 Clicks" Mobility  Outcome Measure Help needed turning from your back to your side while in a flat bed without using bedrails?: A Little Help needed moving from lying on your back to sitting on the side of a flat bed without using bedrails?: A Little Help needed moving to and from a bed to a chair (including a wheelchair)?: A Lot Help needed standing up from a chair using your arms (e.g., wheelchair or bedside chair)?: A Lot Help needed to walk in hospital room?: A Lot Help needed climbing 3-5 steps with a railing? : Total 6 Click Score: 13    End of Session Equipment Utilized During Treatment: Gait belt Activity Tolerance: Patient limited by fatigue Patient left: in chair;with call bell/phone within reach;with chair alarm set   PT Visit Diagnosis: Other abnormalities of gait and mobility (R26.89);Muscle weakness (generalized) (M62.81);Other symptoms and signs involving the nervous system (R29.898)    Time: 3785-8850 PT Time Calculation (min) (ACUTE ONLY): 24 min   Charges:   PT  Evaluation $PT Eval Moderate Complexity: 1 Mod PT Treatments $Therapeutic Activity: 8-22 mins        Sheran Lawless, PT Acute Rehabilitation Services Pager:[phone] Office:[phone] 05/21/2021   Elray Mcgregor 05/21/2021, 4:11 PM

## 2021-05-21 NOTE — Evaluation (Addendum)
Clinical/Bedside Swallow Evaluation Patient Details  Name: Dana Turner MRN: [id number] Date of Birth: 09-14-1965  Today's Date: 05/21/2021 Time: SLP Start Time (ACUTE ONLY): 0930 SLP Stop Time (ACUTE ONLY): 0945 SLP Time Calculation (min) (ACUTE ONLY): 15 min  Past Medical History:  Past Medical History:  Diagnosis Date   Anxiety    Depression    Hypertension    Legally blind in left eye, as defined in [place]    Lupus [facility])    Past Surgical History:  Past Surgical History:  Procedure Laterality Date   ABDOMINAL HYSTERECTOMY  1996   EYE SURGERY Left 2016   HPI:  [name] is a 55 y.o. female with recent diagnosis of multiple sclerosis and received Ocrevus infusion on April 29, 2021 has been experiencing increasing left-sided weakness and fever for the last 48 hours. MRI of the brain and C-spine do not show any worsening of the patient's known history of multiple sclerosis. COVID test came back positive. Chest x-ray was unremarkable.  Patient has failed swallow evaluation.    Assessment / Plan / Recommendation  Clinical Impression  Pt demonstrates no apparent dysphagia, denies difficulty.   Will resume a regular texture diet and sign off. SLP Visit Diagnosis: Dysphagia, unspecified (R13.10)    Aspiration Risk       Diet Recommendation Regular;Thin liquid   Liquid Administration via: Cup;Straw Medication Administration: Whole meds with liquid Supervision: Patient able to self feed Compensations: Slow rate;Small sips/bites Postural Changes: Seated upright at 90 degrees    Other  Recommendations      Recommendations for follow up therapy are one component of a multi-disciplinary discharge planning process, led by the attending physician.  Recommendations may be updated based on patient status, additional functional criteria and insurance authorization.  Follow up Recommendations None      Frequency and Duration            Prognosis        Swallow Study    General HPI: [name] is a 55 y.o. female with recent diagnosis of multiple sclerosis and received Ocrevus infusion on April 29, 2021 has been experiencing increasing left-sided weakness and fever for the last 48 hours. MRI of the brain and C-spine do not show any worsening of the patient's known history of multiple sclerosis. COVID test came back positive. Chest x-ray was unremarkable.  Patient has failed swallow evaluation. Type of Study: Bedside Swallow Evaluation Diet Prior to this Study: Regular;Thin liquids Temperature Spikes Noted: No Respiratory Status: Room air History of Recent Intubation: No Behavior/Cognition: Alert;Cooperative;Pleasant mood Oral Cavity Assessment: Within Functional Limits Oral Care Completed by SLP: No Oral Cavity - Dentition: Adequate natural dentition Self-Feeding Abilities: Able to feed self Patient Positioning: Upright in bed Volitional Cough: Strong Volitional Swallow: Able to elicit    Oral/Motor/Sensory Function Overall Oral Motor/Sensory Function: Within functional limits   Ice Chips     Thin Liquid Thin Liquid: Within functional limits    Nectar Thick Nectar Thick Liquid: Not tested   Honey Thick Honey Thick Liquid: Not tested   Puree Puree: Within functional limits   Solid     Solid: Within functional limits      [name], Riley Nearing 05/21/2021,12:38 PM

## 2021-05-22 DIAGNOSIS — U071 COVID-19: Secondary | ICD-10-CM | POA: Diagnosis not present

## 2021-05-22 LAB — CBC WITH DIFFERENTIAL/PLATELET
Abs Immature Granulocytes: 0 10*3/uL (ref 0.00–0.07)
Basophils Absolute: 0 10*3/uL (ref 0.0–0.1)
Basophils Relative: 1 %
Eosinophils Absolute: 0 10*3/uL (ref 0.0–0.5)
Eosinophils Relative: 0 %
HCT: 36.3 % (ref 36.0–46.0)
Hemoglobin: 12.1 g/dL (ref 12.0–15.0)
Immature Granulocytes: 0 %
Lymphocytes Relative: 36 %
Lymphs Abs: 1.1 10*3/uL (ref 0.7–4.0)
MCH: 27.4 pg (ref 26.0–34.0)
MCHC: 33.3 g/dL (ref 30.0–36.0)
MCV: 82.1 fL (ref 80.0–100.0)
Monocytes Absolute: 0.7 10*3/uL (ref 0.1–1.0)
Monocytes Relative: 21 %
Neutro Abs: 1.4 10*3/uL — ABNORMAL LOW (ref 1.7–7.7)
Neutrophils Relative %: 42 %
Platelets: 149 10*3/uL — ABNORMAL LOW (ref 150–400)
RBC: 4.42 MIL/uL (ref 3.87–5.11)
RDW: 14.1 % (ref 11.5–15.5)
WBC: 3.2 10*3/uL — ABNORMAL LOW (ref 4.0–10.5)
nRBC: 0 % (ref 0.0–0.2)

## 2021-05-22 LAB — COMPREHENSIVE METABOLIC PANEL
ALT: 16 U/L (ref 0–44)
AST: 30 U/L (ref 15–41)
Albumin: 3.1 g/dL — ABNORMAL LOW (ref 3.5–5.0)
Alkaline Phosphatase: 57 U/L (ref 38–126)
Anion gap: 10 (ref 5–15)
BUN: 7 mg/dL (ref 6–20)
CO2: 21 mmol/L — ABNORMAL LOW (ref 22–32)
Calcium: 8.9 mg/dL (ref 8.9–10.3)
Chloride: 104 mmol/L (ref 98–111)
Creatinine, Ser: 0.7 mg/dL (ref 0.44–1.00)
GFR, Estimated: >60 mL/min (ref >60)
Glucose, Bld: 113 mg/dL — ABNORMAL HIGH (ref 70–99)
Potassium: 3.2 mmol/L — ABNORMAL LOW (ref 3.5–5.1)
Sodium: 135 mmol/L (ref 135–145)
Total Bilirubin: 0.4 mg/dL (ref 0.3–1.2)
Total Protein: 6.7 g/dL (ref 6.5–8.1)

## 2021-05-22 LAB — D-DIMER, QUANTITATIVE: D-Dimer, Quant: 0.44 ug/mL-FEU (ref 0.00–0.50)

## 2021-05-22 LAB — URINE CULTURE

## 2021-05-22 LAB — C-REACTIVE PROTEIN: CRP: 0.6 mg/dL (ref <1.0)

## 2021-05-22 LAB — GLUCOSE, CAPILLARY
Glucose-Capillary: 110 mg/dL — ABNORMAL HIGH (ref 70–99)
Glucose-Capillary: 112 mg/dL — ABNORMAL HIGH (ref 70–99)
Glucose-Capillary: 121 mg/dL — ABNORMAL HIGH (ref 70–99)
Glucose-Capillary: 97 mg/dL (ref 70–99)

## 2021-05-22 MED ORDER — POTASSIUM CHLORIDE CRYS ER 20 MEQ PO TBCR
40.0000 meq | EXTENDED_RELEASE_TABLET | ORAL | Status: AC
Start: 2021-05-22 — End: 2021-05-22
  Administered 2021-05-22 (×2): 40 meq via ORAL
  Filled 2021-05-22 (×2): qty 2

## 2021-05-22 MED ORDER — LISINOPRIL 20 MG PO TABS
20.0000 mg | ORAL_TABLET | Freq: Every day | ORAL | Status: DC
  Administered 2021-05-22 – 2021-05-24 (×3): 20 mg via ORAL
  Filled 2021-05-22 (×3): qty 1

## 2021-05-22 NOTE — Progress Notes (Signed)
Inpatient Rehab Admissions Coordinator Note:   Per therapy patient was screened for CIR candidacy by Stephania Fragmin, PT. At this time, pt appears to be a potential candidate for CIR. I will place an order for rehab consult for full assessment, per our protocol.  Please contact me any with questions.Estill Dooms, PT, DPT [phone] 05/22/21 5:03 PM

## 2021-05-22 NOTE — Plan of Care (Signed)
  Problem: Nutrition: Goal: Adequate nutrition will be maintained Outcome: Progressing   Problem: Pain Managment: Goal: General experience of comfort will improve Outcome: Progressing   Problem: Safety: Goal: Ability to remain free from injury will improve Outcome: Progressing   

## 2021-05-22 NOTE — [place] FL2 (Signed)
[place] MEDICAID FL2 LEVEL OF CARE SCREENING TOOL     IDENTIFICATION  Patient Name: Dana Turner Birthdate: 27-Jun-1966 Sex: female Admission Date (Current Location): 05/20/2021  [facility] and [place] Number:  Producer, television/film/video and Address:  [name], [address] [place], [place] [address]      Provider Number: [id number]  Attending Physician Name and Address:  Dana Clos, MD  Relative Name and Phone Number:       Current Level of Care: Hospital Recommended Level of Care: Skilled Nursing Facility Prior Approval Number:    Date Approved/Denied:   PASRR Number: [id number]  Discharge Plan: SNF    Current Diagnoses: Patient Active Problem List   Diagnosis Date Noted   COVID-19 05/21/2021   Weakness 05/20/2021   Hypertensive urgency 05/20/2021   Febrile illness 05/20/2021   Multiple sclerosis (HCC) 04/23/2021   Essential hypertension 09/14/2018   History of lupus 09/14/2018   Chronic pain of right knee 09/14/2018   History of tear of ACL (anterior cruciate ligament) 09/14/2018    Orientation RESPIRATION BLADDER Height & Weight     Self, Situation, Time, Place  Normal Continent Weight: 130 lb (59 kg) Height:  5\' 3"  (160 cm)  BEHAVIORAL SYMPTOMS/MOOD NEUROLOGICAL BOWEL NUTRITION STATUS      Continent Diet (See DC summary)  AMBULATORY STATUS COMMUNICATION OF NEEDS Skin   Extensive Assist Verbally Normal                       Personal Care Assistance Level of Assistance  Bathing, Feeding, Dressing Bathing Assistance: Maximum assistance Feeding assistance: Limited assistance Dressing Assistance: Maximum assistance     Functional Limitations Info  Hearing, Sight, Speech Sight Info: Adequate Hearing Info: Adequate Speech Info: Adequate    SPECIAL CARE FACTORS FREQUENCY  PT (By licensed PT), OT (By licensed OT)     PT Frequency: 5x a week OT Frequency: 5x a week            Contractures Contractures  Info: Not present    Additional Factors Info  Code Status, Allergies Code Status Info: Full Allergies Info: NKA           Current Medications (05/22/2021):  This is the current hospital active medication list Current Facility-Administered Medications  Medication Dose Route Frequency Provider Last Rate Last Admin   acetaminophen (TYLENOL) tablet 650 mg  650 mg Oral Q6H PRN 05/24/2021, MD   650 mg at 05/22/21 1257   Or   acetaminophen (TYLENOL) suppository 650 mg  650 mg Rectal Q6H PRN 05/24/21, MD       acetaminophen (TYLENOL) tablet 1,000 mg  1,000 mg Oral Once Dana Clos, MD       amLODipine (NORVASC) tablet 10 mg  10 mg Oral Daily Pahwani, Dana Clos, MD   10 mg at 05/22/21 1056   cefTRIAXone (ROCEPHIN) 1 g in sodium chloride 0.9 % 100 mL IVPB  1 g Intravenous Q24H Pahwani, 05/24/21, MD 200 mL/hr at 05/21/21 1721 1 g at 05/21/21 1721   dexamethasone (DECADRON) tablet 6 mg  6 mg Oral Daily Pahwani, 05/23/21, MD   6 mg at 05/22/21 1056   enoxaparin (LOVENOX) injection 40 mg  40 mg Subcutaneous Q24H Pahwani, 05/24/21, MD   40 mg at 05/22/21 1057   hydrALAZINE (APRESOLINE) injection 10 mg  10 mg Intravenous Q6H PRN 05/24/21, MD   10 mg at 05/22/21 0023   labetalol (NORMODYNE) injection  10 mg  10 mg Intravenous Q2H PRN Dana Clos, MD   10 mg at 05/21/21 0622   lisinopril (ZESTRIL) tablet 20 mg  20 mg Oral Daily Pahwani, Daleen Bo, MD   20 mg at 05/22/21 1056   remdesivir 100 mg in sodium chloride 0.9 % 100 mL IVPB  100 mg Intravenous Daily Dana Clos, MD 200 mL/hr at 05/22/21 1110 100 mg at 05/22/21 1110     Discharge Medications: Please see discharge summary for a list of discharge medications.  Relevant Imaging Results:  Relevant Lab Results:   Additional Information SSN: [id number], covid positive  Jimmy Picket, LCSW

## 2021-05-22 NOTE — Progress Notes (Signed)
Physical Therapy Treatment Patient Details Name: Dana Turner MRN: [id number] DOB: 1965-10-17 Today's Date: 05/22/2021   History of Present Illness Dana Turner is a 55 y.o. female with recent diagnosis of multiple sclerosis and received Ocrevus infusion on April 29, 2021 has been experiencing increasing left-sided weakness and fever for the last 48 hours. MRI of the brain and C-spine do not show any worsening of the patient's known history of multiple sclerosis. COVID test came back positive. Chest x-ray was unremarkable    PT Comments    Patient received in bed, pleasant but very fatigued this afternoon- politely declines EOB/OOB activities due to fatigue but tells me she was able to do quite a bit earlier today with nursing staff and OT. Worked on bed level exercises today to tolerance. Does seem to have severe ataxia even with low level movements. Left positioned to comfort in bed, all needs met. Refusing SNF but would definitely benefit from skilled rehab prior to return home- would like to try for CIR.    Recommendations for follow up therapy are one component of a multi-disciplinary discharge planning process, led by the attending physician.  Recommendations may be updated based on patient status, additional functional criteria and insurance authorization.  Follow Up Recommendations  CIR;Supervision/Assistance - 24 hour     Equipment Recommendations  None recommended by PT    Recommendations for Other Services       Precautions / Restrictions Precautions Precautions: Fall Precaution Comments: reports 2-3 falls in past 6 months Restrictions Weight Bearing Restrictions: No     Mobility  Bed Mobility Overal bed mobility: Needs Assistance Bed Mobility: Supine to Sit     Supine to sit: Min assist     General bed mobility comments: limited to bed level exercises- fatigue    Transfers Overall transfer level: Needs assistance Equipment used: Rolling walker (2  wheeled) Transfers: Sit to/from Stand Sit to Stand: Min assist         General transfer comment: deferred- fatigue  Ambulation/Gait             General Gait Details: deferred- fatigue   Stairs             Wheelchair Mobility    Modified Rankin (Stroke Patients Only)       Balance Overall balance assessment: Needs assistance Sitting-balance support: No upper extremity supported;Single extremity supported;Feet supported Sitting balance-Leahy Scale: Poor Sitting balance - Comments: relies on 1 UE support, min assist without UE support statically   Standing balance support: Bilateral upper extremity supported;During functional activity Standing balance-Leahy Scale: Poor Standing balance comment: relies on BUE and external support                            Cognition Arousal/Alertness: Awake/alert Behavior During Therapy: WFL for tasks assessed/performed Overall Cognitive Status: No family/caregiver present to determine baseline cognitive functioning Area of Impairment: Problem solving;Following commands                       Following Commands: Follows one step commands consistently;Follows one step commands with increased time;Follows multi-step commands inconsistently     Problem Solving: Slow processing;Requires verbal cues General Comments: pt oriented and pleasant.  Difficulty following mulitple step commands.      Exercises      General Comments General comments (skin integrity, edema, etc.): session limited to bed level activities due to fatigue      Pertinent Vitals/Pain Pain  Assessment: Faces Faces Pain Scale: Hurts little more Pain Location: back, L side Pain Descriptors / Indicators: Aching;Sore Pain Intervention(s): Limited activity within patient's tolerance;Monitored during session;Repositioned    Home Living Family/patient expects to be discharged to:: Private residence Living Arrangements: Other relatives  (sister) Available Help at Discharge: Family;Available 24 hours/day Type of Home: House Home Access: Stairs to enter Entrance Stairs-Rails: Right;Left Home Layout: Two level;Bed/bath upstairs Home Equipment: Walker - 4 wheels;Shower seat;Bedside commode Additional Comments: has 2 walkers, one up and one down    Prior Function Level of Independence: Needs assistance  Gait / Transfers Assistance Needed: independent with rollator ADL's / Homemaking Assistance Needed: independent bathing/dressing, but assist from her sister with tub transfers, toileting independently; sister assists with IADLs     PT Goals (current goals can now be found in the care plan section) Acute Rehab PT Goals Patient Stated Goal: to go home PT Goal Formulation: With patient Time For Goal Achievement: 06/04/21 Potential to Achieve Goals: Good Progress towards PT goals: Not progressing toward goals - comment (limited by fatigue this afternoon)    Frequency    Min 3X/week      PT Plan Discharge plan needs to be updated    Co-evaluation              AM-PAC PT "6 Clicks" Mobility   Outcome Measure  Help needed turning from your back to your side while in a flat bed without using bedrails?: A Little Help needed moving from lying on your back to sitting on the side of a flat bed without using bedrails?: A Little Help needed moving to and from a bed to a chair (including a wheelchair)?: A Lot Help needed standing up from a chair using your arms (e.g., wheelchair or bedside chair)?: A Lot Help needed to walk in hospital room?: A Lot Help needed climbing 3-5 steps with a railing? : Total 6 Click Score: 13    End of Session   Activity Tolerance: Patient limited by fatigue Patient left: in bed;with call bell/phone within reach;with bed alarm set Nurse Communication: Mobility status PT Visit Diagnosis: Other abnormalities of gait and mobility (R26.89);Muscle weakness (generalized) (M62.81);Other symptoms  and signs involving the nervous system (R29.898)     Time: 1440-1459 PT Time Calculation (min) (ACUTE ONLY): 19 min  Charges:  $Therapeutic Exercise: 8-22 mins                    Windell Norfolk, DPT, PN2   Supplemental Physical Therapist [place]    Pager [phone] Acute Rehab Office [phone]

## 2021-05-22 NOTE — Progress Notes (Signed)
PROGRESS NOTE    Dana Turner  ATF:[id number] DOB: 18-Sep-1965 DOA: 05/20/2021 PCP: Georgina Quint, MD   Brief Narrative:  HPI: Dana Turner is a 55 y.o. female with recent diagnosis of multiple sclerosis and received Ocrevus infusion on April 29, 2021 has been experiencing increasing left-sided weakness and fever for the last 48 hours.  Denies any headache nausea vomiting diarrhea shortness of breath chest pain or dysuria.  Patient states she usually uses a walker to walk but due to the weakness she was unable to do this.   ED Course: In the ER patient has been febrile with temperatures around 101.1 F blood pressure was more than 200 systolic MRI of the brain and C-spine was done which does not show any worsening of the patient's known history of multiple sclerosis.  Neurology was consulted and recommended work-up for fever which likely is causing her symptoms.  Blood cultures obtained.  Eventually patient's COVID test came back positive.  Patient has agreed for treatment for COVID.  Chest x-ray was unremarkable.  On exam patient appears generally weak more on the left side.  Patient has failed swallow evaluation.  Patient blood pressure has been elevated for which I ordered as needed IV labetalol.  Inflammatory markers are pending.  WBC 3.3 sodium 132 bicarb 21.  Patient also was placed on ceftriaxone for possible UTI.  Assessment & Plan:   Principal Problem:   COVID-19 Active Problems:   Multiple sclerosis (HCC)   Weakness   Hypertensive urgency   Febrile illness  Patient with history of MS now with left-sided weakness/COVID-19 infection: CT head, MRI cervical spine as well as MRI brain negative for any acute and new findings indicating MS flare.  Patient was tested COVID-positive.  Case was discussed with neurology by ED and neurology opined that her acute weakness on the left side is likely secondary to infection which is common in MS patients.  They recommended treating  COVID-19 and no further or different treatments for MS.  Patient's left-sided weakness is improving.  She has no respiratory symptoms.  She has been started on remdesivir, day 2 today.  Also dexamethasone daily.  Patient tells me that her weakness on the left side and has improved significantly and is back to baseline and she actually wants to go home.  She was seen by PT OT who recommends SNF.  I discussed with patient's sister who is the sole caretaker of her and she prefers for the patient to go to SNF.  She is going to discuss this with the patient.  Nonetheless, patient needs at least 3 doses of remdesivir before she can be discharged and tomorrow will be day 3.  Continue current management. Patient was encouraged to prone, out of bed to chair, to use incentive spirometry and flutter valve.  No indication of treating with baricitinib or Actemra.  Hypertensive urgency: Blood pressure better but still elevated.  Continue home dose of amlodipine and as needed hydralazine, will add lisinopril 20 mg p.o. daily.  History of lupus: Patient takes Plaquenil at home.  Currently on hold due to infection.  UTI: Continue Rocephin and follow culture.  Hypokalemia: Low again.  Will replace.  DVT prophylaxis: enoxaparin (LOVENOX) injection 40 mg Start: 05/22/21 1000 SCDs Start: 05/20/21 2203   Code Status: Full Code  Family Communication:  None present at bedside.  Plan of care discussed with patient in length and he verbalized understanding and agreed with it.  Also discussed with the sister over the phone.  Status is: Inpatient  Remains inpatient appropriate because:Inpatient level of care appropriate due to severity of illness  Dispo: The patient is from: Home              Anticipated d/c is to: SNF              Patient currently is not medically stable to d/c.   Difficult to place patient No        Estimated body mass index is 23.03 kg/m as calculated from the following:   Height as of this  encounter: 5\' 3"  (1.6 m).   Weight as of this encounter: 59 kg.     Nutritional Assessment: Body mass index is 23.03 kg/m. Seen by dietician.  I agree with the assessment and plan as outlined below: Nutrition Status:        .  Skin Assessment: I have examined the patient's skin and I agree with the wound assessment as performed by the wound care RN as outlined below:    Consultants:  None but neurology consulted by ED  Procedures:  None  Antimicrobials:  Anti-infectives (From admission, onward)    Start     Dose/Rate Route Frequency Ordered Stop   05/22/21 1000  remdesivir 100 mg in sodium chloride 0.9 % 100 mL IVPB       See Hyperspace for full Linked Orders Report.   100 mg 200 mL/hr over 30 Minutes Intravenous Daily 05/21/21 0419 05/26/21 0959   05/21/21 1800  cefTRIAXone (ROCEPHIN) 1 g in sodium chloride 0.9 % 100 mL IVPB        1 g 200 mL/hr over 30 Minutes Intravenous Every 24 hours 05/20/21 2207     05/21/21 0600  remdesivir 200 mg in sodium chloride 0.9% 250 mL IVPB       See Hyperspace for full Linked Orders Report.   200 mg 580 mL/hr over 30 Minutes Intravenous Once 05/21/21 0419 05/21/21 0631   05/20/21 2015  cefTRIAXone (ROCEPHIN) 1 g in sodium chloride 0.9 % 100 mL IVPB        1 g 200 mL/hr over 30 Minutes Intravenous  Once 05/20/21 2005 05/20/21 2121          Subjective: Seen and examined.  She complains of low back pain due to uncomfortable bed.  Otherwise she has no respiratory symptoms and her weakness on the left upper and lower extremity is improved and she says that it is back to baseline now.  Objective: Vitals:   05/21/21 1949 05/22/21 0023 05/22/21 0059 05/22/21 0500  BP: (!) 161/86 (!) 164/83 (!) 147/82 (!) 158/97  Pulse: 66 68  70  Resp: 20 18  16   Temp: 99.2 F (37.3 C) 98.2 F (36.8 C)  98.9 F (37.2 C)  TempSrc: Oral Oral  Oral  SpO2: 100% 100%  99%  Weight:      Height:        Intake/Output Summary (Last 24 hours) at  05/22/2021 1126 Last data filed at 05/21/2021 1949 Gross per 24 hour  Intake 1729.33 ml  Output 800 ml  Net 929.33 ml    Filed Weights   05/20/21 1213  Weight: 59 kg    Examination:  General exam: Appears calm and comfortable  Respiratory system: Clear to auscultation. Respiratory effort normal. Cardiovascular system: S1 & S2 heard, RRR. No JVD, murmurs, rubs, gallops or clicks. No pedal edema. Gastrointestinal system: Abdomen is nondistended, soft and nontender. No organomegaly or masses felt. Normal bowel sounds heard. Central  nervous system: Alert and oriented.  4/5 power in all 4 extremities. Extremities: Symmetric 5 x 5 power. Skin: No rashes, lesions or ulcers.  Psychiatry: Judgement and insight appear normal. Mood & affect appropriate.   Data Reviewed: I have personally reviewed following labs and imaging studies  CBC: Recent Labs  Lab 05/20/21 1219 05/20/21 1235 05/21/21 0520 05/21/21 0900 05/22/21 0101  WBC 3.3*  --  3.6* 3.2* 3.2*  NEUTROABS 2.2  --  1.9 1.7 1.4*  HGB 12.9 13.9 12.4 12.0 12.1  HCT 39.8 41.0 37.5 37.9 36.3  MCV 84.0  --  83.3 85.6 82.1  PLT 188  --  165 158 149*    Basic Metabolic Panel: Recent Labs  Lab 05/20/21 1219 05/20/21 1235 05/21/21 0900 05/22/21 0101  NA 132* 135 134* 135  K 3.7 3.9 3.4* 3.2*  CL 102 104 104 104  CO2 21*  --  21* 21*  GLUCOSE 107* 106* 148* 113*  BUN 9 11 9 7   CREATININE 0.78 0.60 0.76 0.70  CALCIUM 9.2  --  8.7* 8.9    GFR: Estimated Creatinine Clearance: 66.5 mL/min (by C-G formula based on SCr of 0.7 mg/dL). Liver Function Tests: Recent Labs  Lab 05/20/21 1219 05/21/21 0900 05/22/21 0101  AST 19 26 30   ALT 14 16 16   ALKPHOS 71 59 57  BILITOT 0.4 0.6 0.4  PROT 7.6 6.5 6.7  ALBUMIN 3.7 3.1* 3.1*    No results for input(s): LIPASE, AMYLASE in the last 168 hours. No results for input(s): AMMONIA in the last 168 hours. Coagulation Profile: Recent Labs  Lab 05/20/21 1219  INR 1.0     Cardiac Enzymes: No results for input(s): CKTOTAL, CKMB, CKMBINDEX, TROPONINI in the last 168 hours. BNP (last 3 results) No results for input(s): PROBNP in the last 8760 hours. HbA1C: No results for input(s): HGBA1C in the last 72 hours. CBG: Recent Labs  Lab 05/21/21 0122 05/21/21 0629 05/21/21 1716 05/22/21 0005 05/22/21 0502  GLUCAP 128* 129* 146* 121* 112*    Lipid Profile: No results for input(s): CHOL, HDL, LDLCALC, TRIG, CHOLHDL, LDLDIRECT in the last 72 hours. Thyroid Function Tests: No results for input(s): TSH, T4TOTAL, FREET4, T3FREE, THYROIDAB in the last 72 hours. Anemia Panel: No results for input(s): VITAMINB12, FOLATE, FERRITIN, TIBC, IRON, RETICCTPCT in the last 72 hours. Sepsis Labs: Recent Labs  Lab 05/21/21 0520  PROCALCITON <0.10     Recent Results (from the past 240 hour(s))  Urine Culture     Status: Abnormal   Collection Time: 05/20/21  7:00 PM   Specimen: Urine, Clean Catch  Result Value Ref Range Status   Specimen Description URINE, CLEAN CATCH  Final   Special Requests   Final    NONE Performed at [facility], [address] [place], [place] [address]    Culture MULTIPLE SPECIES PRESENT, SUGGEST RECOLLECTION (A)  Final   Report Status 05/22/2021 FINAL  Final  Resp Panel by RT-PCR (Flu A&B, Covid) Nasopharyngeal Swab     Status: Abnormal   Collection Time: 05/20/21  8:13 PM   Specimen: Nasopharyngeal Swab; Nasopharyngeal(NP) swabs in vial transport medium  Result Value Ref Range Status   SARS Coronavirus 2 by RT PCR POSITIVE (A) NEGATIVE Final    Comment: RESULT CALLED TO, READ BACK BY AND VERIFIED WITH: RN TORY GLASSON 05/20/21@22 :43 BY TW (NOTE) SARS-CoV-2 target nucleic acids are DETECTED.  The SARS-CoV-2 RNA is generally detectable in upper respiratory specimens during the acute phase of infection.  Positive results are indicative of the presence of the identified virus, but do not rule out bacterial infection or  co-infection with other pathogens not detected by the test. Clinical correlation with patient history and other diagnostic information is necessary to determine patient infection status. The expected result is Negative.  Fact Sheet for Patients: BloggerCourse.com  Fact Sheet for Healthcare Providers: SeriousBroker.it  This test is not yet approved or cleared by the [place] FDA and  has been authorized for detection and/or diagnosis of SARS-CoV-2 by FDA under an Emergency Use Authorization (EUA).  This EUA will remain in effect (meaning this test can  be used) for the duration of  the COVID-19 declaration under Section 564(b)(1) of the Act, 21 U.S.C. section 360bbb-3(b)(1), unless the authorization is terminated or revoked sooner.     Influenza A by PCR NEGATIVE NEGATIVE Final   Influenza B by PCR NEGATIVE NEGATIVE Final    Comment: (NOTE) The Xpert Xpress SARS-CoV-2/FLU/RSV plus assay is intended as an aid in the diagnosis of influenza from Nasopharyngeal swab specimens and should not be used as a sole basis for treatment. Nasal washings and aspirates are unacceptable for Xpert Xpress SARS-CoV-2/FLU/RSV testing.  Fact Sheet for Patients: BloggerCourse.com  Fact Sheet for Healthcare Providers: SeriousBroker.it  This test is not yet approved or cleared by the [place] FDA and has been authorized for detection and/or diagnosis of SARS-CoV-2 by FDA under an Emergency Use Authorization (EUA). This EUA will remain in effect (meaning this test can be used) for the duration of the COVID-19 declaration under Section 564(b)(1) of the Act, 21 U.S.C. section 360bbb-3(b)(1), unless the authorization is terminated or revoked.  Performed at [facility], [address] [place], [place] [address]   Blood culture (routine x 2)     Status: None (Preliminary result)    Collection Time: 05/20/21  8:21 PM   Specimen: BLOOD RIGHT HAND  Result Value Ref Range Status   Specimen Description BLOOD RIGHT HAND  Final   Special Requests   Final    BOTTLES DRAWN AEROBIC AND ANAEROBIC Blood Culture adequate volume   Culture   Final    NO GROWTH < 24 HOURS Performed at [facility], [address], [place], [place] [address]    Report Status PENDING  Incomplete  Blood culture (routine x 2)     Status: None (Preliminary result)   Collection Time: 05/20/21  8:21 PM   Specimen: BLOOD  Result Value Ref Range Status   Specimen Description BLOOD LEFT ANTECUBITAL  Final   Special Requests   Final    BOTTLES DRAWN AEROBIC AND ANAEROBIC Blood Culture results may not be optimal due to an excessive volume of blood received in culture bottles   Culture   Final    NO GROWTH < 24 HOURS Performed at [facility], [address], [place], [place] [address]    Report Status PENDING  Incomplete       Radiology Studies: CT HEAD WO CONTRAST  Result Date: 05/20/2021 CLINICAL DATA:  There are distally, acute, stroke suspected. Additional history provided: Generalized weakness and slurred speech since yesterday. EXAM: CT HEAD WITHOUT CONTRAST TECHNIQUE: Contiguous axial images were obtained from the base of the skull through the vertex without intravenous contrast. COMPARISON:  Brain MRI 03/26/2021.  Head CT 02/06/2018. FINDINGS: Brain: Mild generalized cerebral atrophy. Severe patchy and ill-defined hypoattenuation within the cerebral white matter. Known lesions within the pons, bilateral middle cerebellar peduncles and bilateral  cerebellar hemispheres better appreciated on the brain MRI of 03/26/2021. On the prior MRI, the imaging features were characterized as most suggestive of demyelinating disease. There is no acute intracranial hemorrhage. No demarcated cortical infarct. No extra-axial fluid collection. No evidence of an intracranial mass. No midline shift. Vascular:  No hyperdense vessel.  Atherosclerotic calcifications. Skull: Normal. Negative for fracture or focal lesion. Sinuses/Orbits: Visualized orbits show no acute finding. Mild mucosal thickening within the frontal sinuses. Mild-to-moderate mucosal thickening within the bilateral ethmoid air cells. 6 mm mucous retention cyst within the right sphenoid sinus. Mild mucosal thickening within the bilateral maxillary sinuses the imaged levels. IMPRESSION: No evidence of acute intracranial abnormality. Redemonstrated severe chronic white matter changes, characterized as most suggestive of sequela of demyelinating disease on the prior brain MRI of 03/26/2021. Mild generalized cerebral atrophy. Paranasal sinus disease, as described. Electronically Signed   By: Jackey Loge D.O.   On: 05/20/2021 12:59   MR Brain W and Wo Contrast  Result Date: 05/20/2021 CLINICAL DATA:  Multiple sclerosis with new symptoms. EXAM: MRI HEAD WITHOUT AND WITH CONTRAST TECHNIQUE: Multiplanar, multiecho pulse sequences of the brain and surrounding structures were obtained without and with intravenous contrast. CONTRAST:  73mL GADAVIST GADOBUTROL 1 MMOL/ML IV SOLN COMPARISON:  Head CT same day.  MRI 03/26/2021. FINDINGS: Brain: There are widespread and extensive changes of demyelinating disease affecting the cervicomedullary junction, brainstem, cerebellum and throughout the white matter of both cerebral hemispheres. The pattern is unchanged since July of this year. No lesions show restricted diffusion or contrast enhancement. No evidence of ischemic infarction, mass lesion, hydrocephalus or extra-axial collection. Vascular: Major vessels at the base of the brain show flow. Skull and upper cervical spine: Negative Sinuses/Orbits: Clear except for mild mucosal thickening. Some divergent gaze is noted. Other: None IMPRESSION: No change appreciable since the study of March 26, 2021. Extensive widespread demyelinating changes throughout the brain as  outlined above. No evidence of new or progressive lesion. No lesion shows restricted diffusion or contrast enhancement. Electronically Signed   By: Paulina Fusi M.D.   On: 05/20/2021 19:04   MR Cervical Spine W or Wo Contrast  Result Date: 05/20/2021 CLINICAL DATA:  Multiple sclerosis.  New event. EXAM: MRI CERVICAL SPINE WITHOUT AND WITH CONTRAST TECHNIQUE: Multiplanar and multiecho pulse sequences of the cervical spine, to include the craniocervical junction and cervicothoracic junction, were obtained without and with intravenous contrast. CONTRAST:  55mL GADAVIST GADOBUTROL 1 MMOL/ML IV SOLN COMPARISON:  03/11/2021 FINDINGS: Alignment: No malalignment. Vertebrae: No fracture or focal bone lesion. Cord: Heterogeneous T2 and FLAIR signal affecting the craniocervical junction in the cervical spine to the upper thoracic region. The appearance is unchanged since the prior exam. No newly seen lesion. No lesion shows contrast enhancement. Posterior Fossa, vertebral arteries, paraspinal tissues: See results of brain MRI. Paraspinal tissues of the neck appear unremarkable. Disc levels: Foramen magnum is widely patent. No significant finding at C1-2, C2-3 or C3-4. C4-5: Right foraminal stenosis due to encroachment by osteophytes and bulging disc material could affect the right C5 nerve. No change. C5-6 and C6-7: Mild, non-compressive disc bulges. C7-T1: Normal IMPRESSION: Heterogeneous T2 and FLAIR signal affecting the craniocervical junction and cervical spinal cord, unchanged since the study of July. No new demyelinating lesion. No abnormal contrast enhancement. 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 Electronically Signed   By: Paulina Fusi M.D.   On: 05/20/2021 19:09   DG Chest [facility]  Result Date: 05/20/2021 CLINICAL DATA:  Fever. Additional provided: Fever, weakness, question stroke. EXAM: PORTABLE CHEST 1 VIEW COMPARISON:  Prior  chest radiographs 02/06/2018. FINDINGS: Heart size within normal limits. No appreciable airspace consolidation. No evidence of pleural effusion or pneumothorax. No acute bony abnormality identified. Thoracic dextrocurvature. IMPRESSION: No evidence of active cardiopulmonary disease. Electronically Signed   By: Jackey Loge D.O.   On: 05/20/2021 13:04    Scheduled Meds:  acetaminophen  1,000 mg Oral Once   amLODipine  10 mg Oral Daily   dexamethasone  6 mg Oral Daily   enoxaparin (LOVENOX) injection  40 mg Subcutaneous Q24H   lisinopril  20 mg Oral Daily   potassium chloride  40 mEq Oral Q4H   Continuous Infusions:  cefTRIAXone (ROCEPHIN)  IV 1 g (05/21/21 1721)   remdesivir 100 mg in NS 100 mL 100 mg (05/22/21 1110)     LOS: 1 day   Time spent: 30 minutes   Hughie Closs, MD [facility]  05/22/2021, 11:26 AM  Please page via Loretha Stapler and do not message via secure chat for anything urgent. Secure chat can be used for anything non urgent.  How to contact the [facility] Attending or Consulting provider 7A - 7P or covering provider during after hours 7P -7A, for this patient?  Check the care team in [facility] and look for a) attending/consulting [facility] provider listed and b) the [facility] team listed. Page or secure chat 7A-7P. Log into [email or web address] and use [place]'s universal password to access. If you do not have the password, please contact the hospital operator. Locate the [facility] provider you are looking for under [facility] and page to a number that you can be directly reached. If you still have difficulty reaching the provider, please page the [facility] (Director on Call) for the Hospitalists listed on amion for assistance.

## 2021-05-22 NOTE — Evaluation (Signed)
Occupational Therapy Evaluation Patient Details Name: Dana Turner MRN: [id number] DOB: 02/14/1966 Today's Date: 05/22/2021   History of Present Illness Dana Turner is a 55 y.o. female with recent diagnosis of multiple sclerosis and received Ocrevus infusion on April 29, 2021 has been experiencing increasing left-sided weakness and fever for the last 48 hours. MRI of the brain and C-spine do not show any worsening of the patient's known history of multiple sclerosis. COVID test came back positive. Chest x-ray was unremarkable   Clinical Impression   PTA patient reports independent with ADLs, assist for tub transfers from sister; using rollator for mobility. Admitted for above and limited by problem list below, including impaired balance, generalized ataxia and weakness, and decreased activity tolerance.  Pt oriented and follows simple commands but difficulty following multiple step commands during session. She requires min assist for bed mobility, min assist for transfers and limited mobility using RW, up to mod assist for ADLs.  Believe she will benefit from further OT services while admitted and after dc at CIR level to optimize independence, safety prior to return home with her sisters support. Will follow.      Recommendations for follow up therapy are one component of a multi-disciplinary discharge planning process, led by the attending physician.  Recommendations may be updated based on patient status, additional functional criteria and insurance authorization.   Follow Up Recommendations  Supervision/Assistance - 24 hour;CIR    Equipment Recommendations  3 in 1 bedside commode    Recommendations for Other Services       Precautions / Restrictions Precautions Precautions: Fall Precaution Comments: reports 2-3 falls in past 6 months Restrictions Weight Bearing Restrictions: No      Mobility Bed Mobility Overal bed mobility: Needs Assistance Bed Mobility: Supine to Sit      Supine to sit: Min assist     General bed mobility comments: for trunk support to ascend    Transfers Overall transfer level: Needs assistance Equipment used: Rolling walker (2 wheeled) Transfers: Sit to/from Stand Sit to Stand: Min assist         General transfer comment: min assist to power up and steady, cueing for hand placement but pulling on RW to stand    Balance Overall balance assessment: Needs assistance Sitting-balance support: No upper extremity supported;Single extremity supported;Feet supported Sitting balance-Leahy Scale: Poor Sitting balance - Comments: relies on 1 UE support, min assist without UE support statically   Standing balance support: Bilateral upper extremity supported;During functional activity Standing balance-Leahy Scale: Poor Standing balance comment: relies on BUE and external support                           ADL either performed or assessed with clinical judgement   ADL Overall ADL's : Needs assistance/impaired     Grooming: Set up;Sitting           Upper Body Dressing : Minimal assistance;Sitting   Lower Body Dressing: Minimal assistance;Sit to/from stand Lower Body Dressing Details (indicate cue type and reason): able to don/doff socks with min assist for dynamic sitting balance, min assist sit to stand but relies on BUE support Toilet Transfer: Minimal assistance;Ambulation;RW Toilet Transfer Details (indicate cue type and reason): simulated to recliner         Functional mobility during ADLs: Minimal assistance;Rolling walker;Cueing for safety;Cueing for sequencing General ADL Comments: pt limited by weakness, ataxia, and balance     Vision Baseline Vision/History: 2 Legally blind;1 Wears glasses (  in L eye) Patient Visual Report: No change from baseline       Perception     Praxis      Pertinent Vitals/Pain Pain Assessment: Faces Faces Pain Scale: Hurts little more Pain Location: back, L side Pain  Descriptors / Indicators: Aching Pain Intervention(s): Limited activity within patient's tolerance;Monitored during session;Repositioned     Hand Dominance Right   Extremity/Trunk Assessment Upper Extremity Assessment Upper Extremity Assessment: RUE deficits/detail;LUE deficits/detail RUE Deficits / Details: ataxic, strength 4/5 grossly RUE Sensation: WNL RUE Coordination: decreased fine motor;decreased gross motor LUE Deficits / Details: ataxic, grossly 3+/5 MMT LUE Sensation: WNL LUE Coordination: decreased fine motor;decreased gross motor   Lower Extremity Assessment Lower Extremity Assessment: Defer to PT evaluation       Communication Communication Communication: Expressive difficulties (dysarthric/ataxic)   Cognition Arousal/Alertness: Awake/alert Behavior During Therapy: WFL for tasks assessed/performed Overall Cognitive Status: No family/caregiver present to determine baseline cognitive functioning Area of Impairment: Problem solving;Following commands                       Following Commands: Follows one step commands consistently;Follows one step commands with increased time;Follows multi-step commands inconsistently     Problem Solving: Slow processing;Requires verbal cues General Comments: pt oriented and pleasant.  Difficulty following mulitple step commands.   General Comments  VSS on RA    Exercises     Shoulder Instructions      Home Living Family/patient expects to be discharged to:: Private residence Living Arrangements: Other relatives (sister) Available Help at Discharge: Family;Available 24 hours/day Type of Home: House Home Access: Stairs to enter [employer] of Steps: 4 Entrance Stairs-Rails: Right;Left Home Layout: Two level;Bed/bath upstairs Alternate Level Stairs-Number of Steps: flight Alternate Level Stairs-Rails: Right;Left Bathroom Shower/Tub: Chief Strategy Officer: Standard     Home Equipment:  Environmental consultant - 4 wheels;Shower seat;Bedside commode   Additional Comments: has 2 walkers, one up and one down      Prior Functioning/Environment Level of Independence: Needs assistance  Gait / Transfers Assistance Needed: independent with rollator ADL's / Homemaking Assistance Needed: independent bathing/dressing, but assist from her sister with tub transfers, toileting independently; sister assists with IADLs            OT Problem List: Decreased strength;Decreased activity tolerance;Impaired balance (sitting and/or standing);Decreased coordination;Decreased cognition;Decreased knowledge of use of DME or AE;Decreased knowledge of precautions;Pain;Impaired UE functional use      OT Treatment/Interventions: Self-care/ADL training;Therapeutic exercise;DME and/or AE instruction;Therapeutic activities;Patient/family education;Balance training;Energy conservation    OT Goals(Current goals can be found in the care plan section) Acute Rehab OT Goals Patient Stated Goal: to go home OT Goal Formulation: With patient Time For Goal Achievement: 06/05/21 Potential to Achieve Goals: Good  OT Frequency: Min 2X/week   Barriers to D/C:            Co-evaluation              AM-PAC OT "6 Clicks" Daily Activity     Outcome Measure Help from another person eating meals?: A Little Help from another person taking care of personal grooming?: A Little Help from another person toileting, which includes using toliet, bedpan, or urinal?: Total Help from another person bathing (including washing, rinsing, drying)?: A Lot Help from another person to put on and taking off regular upper body clothing?: A Little Help from another person to put on and taking off regular lower body clothing?: A Lot 6 Click Score: 14  End of Session Equipment Utilized During Treatment: Gait belt;Rolling walker Nurse Communication: Mobility status  Activity Tolerance: Patient tolerated treatment well Patient left: in  chair;with call bell/phone within reach;with chair alarm set;with nursing/sitter in room  OT Visit Diagnosis: Other abnormalities of gait and mobility (R26.89);Muscle weakness (generalized) (M62.81);Pain Pain - part of body:  (back, L side- generalized)                Time: 9509-3267 OT Time Calculation (min): 27 min Charges:  OT General Charges $OT Visit: 1 Visit OT Evaluation $OT Eval Moderate Complexity: 1 Mod OT Treatments $Self Care/Home Management : 8-22 mins  Barry Brunner, OT Acute Rehabilitation Services Pager [phone] Office [phone]   Chancy Milroy 05/22/2021, 1:06 PM

## 2021-05-23 DIAGNOSIS — U071 COVID-19: Secondary | ICD-10-CM | POA: Diagnosis not present

## 2021-05-23 LAB — CBC WITH DIFFERENTIAL/PLATELET
Abs Immature Granulocytes: 0.01 10*3/uL (ref 0.00–0.07)
Basophils Absolute: 0 10*3/uL (ref 0.0–0.1)
Basophils Relative: 0 %
Eosinophils Absolute: 0 10*3/uL (ref 0.0–0.5)
Eosinophils Relative: 0 %
HCT: 37 % (ref 36.0–46.0)
Hemoglobin: 11.8 g/dL — ABNORMAL LOW (ref 12.0–15.0)
Immature Granulocytes: 0 %
Lymphocytes Relative: 44 %
Lymphs Abs: 1 10*3/uL (ref 0.7–4.0)
MCH: 27.1 pg (ref 26.0–34.0)
MCHC: 31.9 g/dL (ref 30.0–36.0)
MCV: 84.9 fL (ref 80.0–100.0)
Monocytes Absolute: 0.5 10*3/uL (ref 0.1–1.0)
Monocytes Relative: 19 %
Neutro Abs: 0.9 10*3/uL — ABNORMAL LOW (ref 1.7–7.7)
Neutrophils Relative %: 37 %
Platelets: 154 10*3/uL (ref 150–400)
RBC: 4.36 MIL/uL (ref 3.87–5.11)
RDW: 14.1 % (ref 11.5–15.5)
WBC: 2.4 10*3/uL — ABNORMAL LOW (ref 4.0–10.5)
nRBC: 0 % (ref 0.0–0.2)

## 2021-05-23 LAB — GLUCOSE, CAPILLARY
Glucose-Capillary: 103 mg/dL — ABNORMAL HIGH (ref 70–99)
Glucose-Capillary: 133 mg/dL — ABNORMAL HIGH (ref 70–99)
Glucose-Capillary: 90 mg/dL (ref 70–99)
Glucose-Capillary: 91 mg/dL (ref 70–99)

## 2021-05-23 LAB — COMPREHENSIVE METABOLIC PANEL
ALT: 17 U/L (ref 0–44)
AST: 32 U/L (ref 15–41)
Albumin: 3.2 g/dL — ABNORMAL LOW (ref 3.5–5.0)
Alkaline Phosphatase: 50 U/L (ref 38–126)
Anion gap: 9 (ref 5–15)
BUN: 17 mg/dL (ref 6–20)
CO2: 22 mmol/L (ref 22–32)
Calcium: 9.3 mg/dL (ref 8.9–10.3)
Chloride: 106 mmol/L (ref 98–111)
Creatinine, Ser: 0.74 mg/dL (ref 0.44–1.00)
GFR, Estimated: >60 mL/min (ref >60)
Glucose, Bld: 94 mg/dL (ref 70–99)
Potassium: 4.3 mmol/L (ref 3.5–5.1)
Sodium: 137 mmol/L (ref 135–145)
Total Bilirubin: 0.5 mg/dL (ref 0.3–1.2)
Total Protein: 6.8 g/dL (ref 6.5–8.1)

## 2021-05-23 LAB — D-DIMER, QUANTITATIVE: D-Dimer, Quant: <0.27 ug/mL-FEU (ref 0.00–0.50)

## 2021-05-23 LAB — C-REACTIVE PROTEIN: CRP: 0.6 mg/dL (ref <1.0)

## 2021-05-23 NOTE — Progress Notes (Signed)
PROGRESS NOTE    Dana Turner  ION:[id number] DOB: [birth year] DOA: 05/20/2021 PCP: Georgina Quint, MD    Brief Narrative:   55 year old female with history of multiple sclerosis and recently received Ocrevus infusion has been experiencing increasing left-sided weakness and fever for the last 48 hours. She was diagnosed with COVID-19 infection, she was febrile with temperature 101.1.  MRI of the brain and cervical spine was done that did not show any new multiple sclerotic lesions.  Seen by neurology.  Treated for COVID-19 infection.  Assessment & Plan:   Principal Problem:   COVID-19 Active Problems:   Multiple sclerosis (HCC)   Weakness   Hypertensive urgency   Febrile illness  COVID-19 infection: No pulmonary symptoms.  Treated with remdesivir for 3 days.  Completed therapeutics.  We will continue dexamethasone for 10 days.  Inflammatory markers are stable.  On room air.  Symptomatic treatment and airborne isolation precautions for 10 days.  Multiple sclerosis with left-sided weakness: Imaging studies with no evidence of new findings of MS flare.  Seen by neurology.  Recommended symptomatic treatment. Symptoms improving. Work with PT OT.  Recommended SNF, however with her improvement may even go home with home health PT OT.  Acute UTI present on admission: Suspected.  Ruled out.  Completed 3 days of Rocephin.  History of lupus: On Plaquenil.  Resume on discharge.  Hypertensive urgency: Blood pressure stable now.  Home dose of amlodipine resumed.  Lisinopril added.  Electrolytes: Replaced and adequate.  Potassium improved.   DVT prophylaxis: enoxaparin (LOVENOX) injection 40 mg Start: 05/22/21 1000 SCDs Start: 05/20/21 2203   Code Status: Full code Family Communication: Sister on the phone Disposition Plan: Status is: Inpatient  Remains inpatient appropriate because:Inpatient level of care appropriate due to severity of illness  Dispo: The patient is from:  Home              Anticipated d/c is to: Home with home health versus CIR              Patient currently is not medically stable to d/c.   Difficult to place patient No         Consultants:  Neurology  Procedures:  None  Antimicrobials:  Remdesivir, completed therapy Rocephin, completed therapy   Subjective: Patient seen and examined.  She denies any complaints to me.  She thinks her weakness has improved and she is at about her baseline. She is debating about whether going home or going to rehab.  She was told that nursing home would like her to wait another 7 days to take her to rehab.  She would rather go home.  Objective: Vitals:   05/22/21 2109 05/23/21 0500 05/23/21 0621 05/23/21 0809  BP: 139/78 122/73 122/73 119/77  Pulse: (!) 53 (!) 54 (!) 54 60  Resp: 18 16 16 18   Temp: 98.2 F (36.8 C) 98.5 F (36.9 C) 98.5 F (36.9 C) 98 F (36.7 C)  TempSrc: Oral Oral Oral Oral  SpO2: 98% 100% 100% 100%  Weight:      Height:        Intake/Output Summary (Last 24 hours) at 05/23/2021 1616 Last data filed at 05/23/2021 1111 Gross per 24 hour  Intake 300 ml  Output 400 ml  Net -100 ml   Filed Weights   05/20/21 1213  Weight: 59 kg    Examination:  General exam: Appears calm and comfortable  Not in any distress.  On room air. Without complaints. Respiratory system: Clear  to auscultation. Respiratory effort normal. Cardiovascular system: S1 & S2 heard, RRR.  Gastrointestinal system: Soft.  Nontender.  Bowel sounds present. Central nervous system: Alert and oriented. No focal neurological deficits. Extremities: Symmetric 5 x 5 power.    Data Reviewed: I have personally reviewed following labs and imaging studies  CBC: Recent Labs  Lab 05/20/21 1219 05/20/21 1235 05/21/21 0520 05/21/21 0900 05/22/21 0101 05/23/21 0350  WBC 3.3*  --  3.6* 3.2* 3.2* 2.4*  NEUTROABS 2.2  --  1.9 1.7 1.4* 0.9*  HGB 12.9 13.9 12.4 12.0 12.1 11.8*  HCT 39.8 41.0 37.5  37.9 36.3 37.0  MCV 84.0  --  83.3 85.6 82.1 84.9  PLT 188  --  165 158 149* 154   Basic Metabolic Panel: Recent Labs  Lab 05/20/21 1219 05/20/21 1235 05/21/21 0900 05/22/21 0101 05/23/21 0350  NA 132* 135 134* 135 137  K 3.7 3.9 3.4* 3.2* 4.3  CL 102 104 104 104 106  CO2 21*  --  21* 21* 22  GLUCOSE 107* 106* 148* 113* 94  BUN 9 11 9 7 17   CREATININE 0.78 0.60 0.76 0.70 0.74  CALCIUM 9.2  --  8.7* 8.9 9.3   GFR: Estimated Creatinine Clearance: 66.5 mL/min (by C-G formula based on SCr of 0.74 mg/dL). Liver Function Tests: Recent Labs  Lab 05/20/21 1219 05/21/21 0900 05/22/21 0101 05/23/21 0350  AST 19 26 30  32  ALT 14 16 16 17   ALKPHOS 71 59 57 50  BILITOT 0.4 0.6 0.4 0.5  PROT 7.6 6.5 6.7 6.8  ALBUMIN 3.7 3.1* 3.1* 3.2*   No results for input(s): LIPASE, AMYLASE in the last 168 hours. No results for input(s): AMMONIA in the last 168 hours. Coagulation Profile: Recent Labs  Lab 05/20/21 1219  INR 1.0   Cardiac Enzymes: No results for input(s): CKTOTAL, CKMB, CKMBINDEX, TROPONINI in the last 168 hours. BNP (last 3 results) No results for input(s): PROBNP in the last 8760 hours. HbA1C: No results for input(s): HGBA1C in the last 72 hours. CBG: Recent Labs  Lab 05/22/21 1200 05/22/21 1717 05/23/21 0042 05/23/21 0624 05/23/21 1110  GLUCAP 97 110* 103* 91 90   Lipid Profile: No results for input(s): CHOL, HDL, LDLCALC, TRIG, CHOLHDL, LDLDIRECT in the last 72 hours. Thyroid Function Tests: No results for input(s): TSH, T4TOTAL, FREET4, T3FREE, THYROIDAB in the last 72 hours. Anemia Panel: No results for input(s): VITAMINB12, FOLATE, FERRITIN, TIBC, IRON, RETICCTPCT in the last 72 hours. Sepsis Labs: Recent Labs  Lab 05/21/21 0520  PROCALCITON <0.10    Recent Results (from the past 240 hour(s))  Urine Culture     Status: Abnormal   Collection Time: 05/20/21  7:00 PM   Specimen: Urine, Clean Catch  Result Value Ref Range Status   Specimen  Description URINE, CLEAN CATCH  Final   Special Requests   Final    NONE Performed at [facility], [address], [place], [facility] [address]    Culture MULTIPLE SPECIES PRESENT, SUGGEST RECOLLECTION (A)  Final   Report Status 05/22/2021 FINAL  Final  Resp Panel by RT-PCR (Flu A&B, Covid) Nasopharyngeal Swab     Status: Abnormal   Collection Time: 05/20/21  8:13 PM   Specimen: Nasopharyngeal Swab; Nasopharyngeal(NP) swabs in vial transport medium  Result Value Ref Range Status   SARS Coronavirus 2 by RT PCR POSITIVE (A) NEGATIVE Final    Comment: RESULT CALLED TO, READ BACK BY AND VERIFIED WITH: RN [id number] 05/20/21@22 :43 BY  TW (NOTE) SARS-CoV-2 target nucleic acids are DETECTED.  The SARS-CoV-2 RNA is generally detectable in upper respiratory specimens during the acute phase of infection. Positive results are indicative of the presence of the identified virus, but do not rule out bacterial infection or co-infection with other pathogens not detected by the test. Clinical correlation with patient history and other diagnostic information is necessary to determine patient infection status. The expected result is Negative.  Fact Sheet for Patients: BloggerCourse.com  Fact Sheet for Healthcare Providers: SeriousBroker.it  This test is not yet approved or cleared by the [place] FDA and  has been authorized for detection and/or diagnosis of SARS-CoV-2 by FDA under an Emergency Use Authorization (EUA).  This EUA will remain in effect (meaning this test can  be used) for the duration of  the COVID-19 declaration under Section 564(b)(1) of the Act, 21 U.S.C. section 360bbb-3(b)(1), unless the authorization is terminated or revoked sooner.     Influenza A by PCR NEGATIVE NEGATIVE Final   Influenza B by PCR NEGATIVE NEGATIVE Final    Comment: (NOTE) The Xpert Xpress SARS-CoV-2/FLU/RSV plus assay is intended as an aid in  the diagnosis of influenza from Nasopharyngeal swab specimens and should not be used as a sole basis for treatment. Nasal washings and aspirates are unacceptable for Xpert Xpress SARS-CoV-2/FLU/RSV testing.  Fact Sheet for Patients: BloggerCourse.com  Fact Sheet for Healthcare Providers: SeriousBroker.it  This test is not yet approved or cleared by the [place] FDA and has been authorized for detection and/or diagnosis of SARS-CoV-2 by FDA under an Emergency Use Authorization (EUA). This EUA will remain in effect (meaning this test can be used) for the duration of the COVID-19 declaration under Section 564(b)(1) of the Act, 21 U.S.C. section 360bbb-3(b)(1), unless the authorization is terminated or revoked.  Performed at [facility], [address], [place], [place] [address]   Blood culture (routine x 2)     Status: None (Preliminary result)   Collection Time: 05/20/21  8:21 PM   Specimen: BLOOD RIGHT HAND  Result Value Ref Range Status   Specimen Description BLOOD RIGHT HAND  Final   Special Requests   Final    BOTTLES DRAWN AEROBIC AND ANAEROBIC Blood Culture adequate volume   Culture   Final    NO GROWTH 3 DAYS Performed at [facility], [address] [place], [place] [address]    Report Status PENDING  Incomplete  Blood culture (routine x 2)     Status: None (Preliminary result)   Collection Time: 05/20/21  8:21 PM   Specimen: BLOOD  Result Value Ref Range Status   Specimen Description BLOOD LEFT ANTECUBITAL  Final   Special Requests   Final    BOTTLES DRAWN AEROBIC AND ANAEROBIC Blood Culture results may not be optimal due to an excessive volume of blood received in culture bottles   Culture   Final    NO GROWTH 3 DAYS Performed at [facility], [address] [place], [place] [address]    Report Status PENDING  Incomplete         Radiology Studies: No results  found.      Scheduled Meds:  acetaminophen  1,000 mg Oral Once   amLODipine  10 mg Oral Daily   dexamethasone  6 mg Oral Daily   enoxaparin (LOVENOX) injection  40 mg Subcutaneous Q24H   lisinopril  20 mg Oral Daily   Continuous Infusions:  remdesivir 100 mg in NS  100 mL 100 mg (05/23/21 1100)     LOS: 2 days    Time spent: 32 minutes    Dorcas Carrow, MD [facility] Pager [phone]

## 2021-05-23 NOTE — Plan of Care (Signed)
  Problem: Pain Managment: Goal: General experience of comfort will improve Outcome: Progressing   

## 2021-05-23 NOTE — Progress Notes (Signed)
Physical Therapy Treatment Patient Details Name: Dana Turner MRN: [id number] DOB: 11/09/1965 Today's Date: 05/23/2021   History of Present Illness 55 y.o. female with recent diagnosis of multiple sclerosis and received Ocrevus infusion on April 29, 2021 has been experiencing increasing left-sided weakness and fever for the last 48 hours. MRI of the brain and C-spine do not show any worsening of the patient's known history of multiple sclerosis. COVID test came back positive. Chest x-ray was unremarkable    PT Comments    Pt was seen for mobility on bed, working on repositioning and there exercise.  Pt declines OOB today but encouraged her to get OOB later.  Pt is talking, perseverating on going home today.  Assured pt that PT did not know but if any further information was available would let her know.  Follow along with her to encourage general mobility with gait and being OOB in chair.  Pt is tired but should be able to get more done next session.  Seems more related to her physical condition than the time of day.  Recommendations for follow up therapy are one component of a multi-disciplinary discharge planning process, led by the attending physician.  Recommendations may be updated based on patient status, additional functional criteria and insurance authorization.  Follow Up Recommendations  CIR;Supervision/Assistance - 24 hour     Equipment Recommendations  None recommended by PT    Recommendations for Other Services Rehab consult     Precautions / Restrictions Precautions Precautions: Fall Precaution Comments: reports 2-3 falls in past 6 months Restrictions Weight Bearing Restrictions: No     Mobility  Bed Mobility Overal bed mobility: Needs Assistance Bed Mobility: Rolling Rolling: Min assist         General bed mobility comments: repositioning on the bed was done and pt was more comfortable afterward    Transfers Overall transfer level: Needs assistance                General transfer comment: declines OOB today  Ambulation/Gait                 Stairs             Wheelchair Mobility    Modified Rankin (Stroke Patients Only)       Balance                                            Cognition Arousal/Alertness: Awake/alert Behavior During Therapy: WFL for tasks assessed/performed Overall Cognitive Status: No family/caregiver present to determine baseline cognitive functioning Area of Impairment: Following commands;Problem solving;Orientation;Attention                 Orientation Level: Time;Situation Current Attention Level: Selective   Following Commands: Follows one step commands inconsistently;Follows one step commands with increased time     Problem Solving: Slow processing;Requires verbal cues;Requires tactile cues General Comments: requires repetitive cues for some exercises      Exercises General Exercises - Lower Extremity Ankle Circles/Pumps: AAROM;5 reps Quad Sets: AROM;10 reps Gluteal Sets: AROM;10 reps Heel Slides: AAROM;10 reps Hip ABduction/ADduction: AAROM;10 reps Straight Leg Raises: AAROM;10 reps;PROM    General Comments General comments (skin integrity, edema, etc.): Pt was assisted to move and go through LE exercises, but is interested in going home today      Pertinent Vitals/Pain Pain Assessment: Faces Faces Pain Scale: Hurts a little  bit Pain Location: L hip Pain Descriptors / Indicators: Guarding Pain Intervention(s): Limited activity within patient's tolerance;Monitored during session;Repositioned    Home Living                      Prior Function            PT Goals (current goals can now be found in the care plan section) Acute Rehab PT Goals Patient Stated Goal: to go home    Frequency    Min 3X/week      PT Plan Current plan remains appropriate    Co-evaluation              AM-PAC PT "6 Clicks" Mobility    Outcome Measure  Help needed turning from your back to your side while in a flat bed without using bedrails?: A Little Help needed moving from lying on your back to sitting on the side of a flat bed without using bedrails?: A Little Help needed moving to and from a bed to a chair (including a wheelchair)?: A Lot Help needed standing up from a chair using your arms (e.g., wheelchair or bedside chair)?: A Lot Help needed to walk in hospital room?: A Lot Help needed climbing 3-5 steps with a railing? : Total 6 Click Score: 13    End of Session   Activity Tolerance: Patient limited by fatigue;Treatment limited secondary to medical complications (Comment) Patient left: in bed;with call bell/phone within reach;with bed alarm set Nurse Communication: Mobility status PT Visit Diagnosis: Muscle weakness (generalized) (M62.81);History of falling (Z91.81);Difficulty in walking, not elsewhere classified (R26.2);Pain Pain - Right/Left: Left Pain - part of body: Leg;Ankle and joints of foot     Time: 1325-1351 PT Time Calculation (min) (ACUTE ONLY): 26 min  Charges:  $Therapeutic Exercise: 8-22 mins $Therapeutic Activity: 8-22 mins                Ivar Drape 05/23/2021, 5:18 PM  Samul Dada, PT MS Acute Rehab Dept. Number: [facility] [id number] and [facility] [phone]

## 2021-05-24 DIAGNOSIS — U071 COVID-19: Secondary | ICD-10-CM | POA: Diagnosis not present

## 2021-05-24 LAB — GLUCOSE, CAPILLARY: Glucose-Capillary: 94 mg/dL (ref 70–99)

## 2021-05-24 MED ORDER — LISINOPRIL 20 MG PO TABS: 20.0000 mg | ORAL_TABLET | Freq: Every day | ORAL | 0 refills | Status: DC

## 2021-05-24 MED ORDER — DEXAMETHASONE 6 MG PO TABS: 6.0000 mg | ORAL_TABLET | Freq: Every day | ORAL | 0 refills | Status: AC

## 2021-05-24 NOTE — TOC Transition Note (Signed)
Transition of Care [facility]) - CM/SW Discharge Note   Patient Details  Name: Dana Turner MRN: [id number] Date of Birth: [birth year]  Transition [facility] Care [facility]) CM/SW Contact:  Bess Kinds, RN Phone Number: [phone] 05/24/2021, 11:55 AM   Clinical Narrative:     Spoke with patient's sister, Steward Drone, on the phone to discuss transition planning. Advised of recommendations for PT and OT. Offered agency choice. Referral accepted by [facility]. Patient's sister n law to provide transportation home. No further TOC needs identified at this time.   Final next level of care: Home w Home Health Services Barriers to Discharge: No Barriers Identified   Patient Goals and CMS Choice     Choice offered to / list presented to : Sibling  Discharge Placement                       Discharge Plan and Services                DME Arranged: N/A DME Agency: NA       HH Arranged: PT, OT HH Agency: [facility] Date [facility] Agency Contacted: 05/24/21 Time HH Agency Contacted: 1155 Representative spoke with at [facility] Agency: Kandee Keen  Social Determinants of Health (SDOH) Interventions     Readmission Risk Interventions No flowsheet data found.

## 2021-05-24 NOTE — Discharge Summary (Signed)
Physician Discharge Summary  Gracin Soohoo WUJ:[id number] DOB: 17-Oct-1965 DOA: 05/20/2021  PCP: Georgina Quint, MD  Admit date: 05/20/2021 Discharge date: 05/24/2021  Admitted From: Home Disposition: Home with home health  Recommendations for Outpatient Follow-up:  Follow up with PCP in 1-2 weeks Follow-up with your neurology  Home Health: PT/OT Equipment/Devices: None  Discharge Condition: Stable CODE STATUS: Full code Diet recommendation: Low-salt diet  Discharge summary:  55 year old female with history of multiple sclerosis and recently received Ocrevus infusion has been experiencing increasing left-sided weakness and fever for the last 48 hours. She was diagnosed with COVID-19 infection, she was febrile with temperature 101.1.  MRI of the brain and cervical spine was done that did not show any new multiple sclerotic lesions.  Seen by neurology.  Treated for COVID-19 infection.   COVID-19 infection: No pulmonary symptoms.  Treated with remdesivir for 4 days.  Completed therapeutics.  We will continue dexamethasone for 10 days.  Inflammatory markers are stable.  On room air.  Symptomatic treatment and airborne isolation precautions for 10 days.   Multiple sclerosis with left-sided weakness: Imaging studies with no evidence of new findings of MS flare.  Seen by neurology.  Recommended symptomatic treatment. Symptoms improving.    Acute UTI present on admission: Suspected.  Ruled out.  Completed 3 days of Rocephin.   History of lupus: On Plaquenil.  Resume on discharge.   Hypertensive urgency: Blood pressure stable now.  Home dose of amlodipine resumed.  Lisinopril added.  Addition of lisinopril prescribed.   Electrolytes: Replaced and adequate.  Potassium improved.  Patient presented with significant sickness and debility.  She work with PT OT.  They recommended inpatient rehab.  Patient was referred to acute inpatient rehab, they will need 10 days until she can be  transferred to acute inpatient rehab from the date of diagnosis.  Patient worked with PT OT.  She lives at home with her sister who is providing care 24/7.  After careful discussion, patient was frustrated to wait for rehab and she would rather go home.  Patient and family agreed to go home.  Patient has adequate support system present at home.  Will prescribe home health PT OT.  She will schedule with her neurology.   Discharge Diagnoses:  Principal Problem:   COVID-19 Active Problems:   Multiple sclerosis (HCC)   Weakness   Hypertensive urgency   Febrile illness    Discharge Instructions  Discharge Instructions     Diet - low sodium heart healthy   Complete by: As directed    Increase activity slowly   Complete by: As directed       Allergies as of 05/24/2021   No Known Allergies      Medication List     STOP taking these medications    traMADol 50 MG tablet Commonly known as: Ultram       TAKE these medications    amLODipine 10 MG tablet Commonly known as: NORVASC Take 1 tablet (10 mg total) by mouth daily.   atorvastatin 20 MG tablet Commonly known as: LIPITOR Take 20 mg by mouth daily.   dexamethasone 6 MG tablet Commonly known as: DECADRON Take 1 tablet (6 mg total) by mouth daily for 5 days.   FeroSul 325 (65 FE) MG tablet Generic drug: ferrous sulfate Take 325 mg by mouth 2 (two) times daily.   gabapentin 100 MG capsule Commonly known as: NEURONTIN Take 1 capsule (100 mg total) by mouth 3 (three) times daily.  hydrALAZINE 25 MG tablet Commonly known as: APRESOLINE Take 25 mg by mouth 2 (two) times daily.   hydroxychloroquine 200 MG tablet Commonly known as: PLAQUENIL Take 200 mg by mouth 2 (two) times daily.   lisinopril 20 MG tablet Commonly known as: ZESTRIL Take 1 tablet (20 mg total) by mouth daily.        Follow-up Information     Georgina Quint, MD Follow up in 2 week(s).   Specialty: Internal Medicine Contact  information: [address] [place] [address] [phone]         Care, [facility] Follow up.   Specialty: Home Health Services Why: the office will call to arrange home health visits - please allow 48 hours Contact information: [address] [place] [address] [phone]                No Known Allergies  Consultations: Neurology   Procedures/Studies: CT HEAD WO CONTRAST  Result Date: 05/20/2021 CLINICAL DATA:  There are distally, acute, stroke suspected. Additional history provided: Generalized weakness and slurred speech since yesterday. EXAM: CT HEAD WITHOUT CONTRAST TECHNIQUE: Contiguous axial images were obtained from the base of the skull through the vertex without intravenous contrast. COMPARISON:  Brain MRI 03/26/2021.  Head CT 02/06/2018. FINDINGS: Brain: Mild generalized cerebral atrophy. Severe patchy and ill-defined hypoattenuation within the cerebral white matter. Known lesions within the pons, bilateral middle cerebellar peduncles and bilateral cerebellar hemispheres better appreciated on the brain MRI of 03/26/2021. On the prior MRI, the imaging features were characterized as most suggestive of demyelinating disease. There is no acute intracranial hemorrhage. No demarcated cortical infarct. No extra-axial fluid collection. No evidence of an intracranial mass. No midline shift. Vascular: No hyperdense vessel.  Atherosclerotic calcifications. Skull: Normal. Negative for fracture or focal lesion. Sinuses/Orbits: Visualized orbits show no acute finding. Mild mucosal thickening within the frontal sinuses. Mild-to-moderate mucosal thickening within the bilateral ethmoid air cells. 6 mm mucous retention cyst within the right sphenoid sinus. Mild mucosal thickening within the bilateral maxillary sinuses the imaged levels. IMPRESSION: No evidence of acute intracranial abnormality. Redemonstrated severe chronic white matter changes, characterized as  most suggestive of sequela of demyelinating disease on the prior brain MRI of 03/26/2021. Mild generalized cerebral atrophy. Paranasal sinus disease, as described. Electronically Signed   By: Jackey Loge D.O.   On: 05/20/2021 12:59   MR Brain W and Wo Contrast  Result Date: 05/20/2021 CLINICAL DATA:  Multiple sclerosis with new symptoms. EXAM: MRI HEAD WITHOUT AND WITH CONTRAST TECHNIQUE: Multiplanar, multiecho pulse sequences of the brain and surrounding structures were obtained without and with intravenous contrast. CONTRAST:  60mL GADAVIST GADOBUTROL 1 MMOL/ML IV SOLN COMPARISON:  Head CT same day.  MRI 03/26/2021. FINDINGS: Brain: There are widespread and extensive changes of demyelinating disease affecting the cervicomedullary junction, brainstem, cerebellum and throughout the white matter of both cerebral hemispheres. The pattern is unchanged since July of this year. No lesions show restricted diffusion or contrast enhancement. No evidence of ischemic infarction, mass lesion, hydrocephalus or extra-axial collection. Vascular: Major vessels at the base of the brain show flow. Skull and upper cervical spine: Negative Sinuses/Orbits: Clear except for mild mucosal thickening. Some divergent gaze is noted. Other: None IMPRESSION: No change appreciable since the study of March 26, 2021. Extensive widespread demyelinating changes throughout the brain as outlined above. No evidence of new or progressive lesion. No lesion shows restricted diffusion or contrast enhancement. Electronically Signed   By: Paulina Fusi  M.D.   On: 05/20/2021 19:04   MR Cervical Spine W or Wo Contrast  Result Date: 05/20/2021 CLINICAL DATA:  Multiple sclerosis.  New event. EXAM: MRI CERVICAL SPINE WITHOUT AND WITH CONTRAST TECHNIQUE: Multiplanar and multiecho pulse sequences of the cervical spine, to include the craniocervical junction and cervicothoracic junction, were obtained without and with intravenous contrast. CONTRAST:  6mL  GADAVIST GADOBUTROL 1 MMOL/ML IV SOLN COMPARISON:  03/11/2021 FINDINGS: Alignment: No malalignment. Vertebrae: No fracture or focal bone lesion. Cord: Heterogeneous T2 and FLAIR signal affecting the craniocervical junction in the cervical spine to the upper thoracic region. The appearance is unchanged since the prior exam. No newly seen lesion. No lesion shows contrast enhancement. Posterior Fossa, vertebral arteries, paraspinal tissues: See results of brain MRI. Paraspinal tissues of the neck appear unremarkable. Disc levels: Foramen magnum is widely patent. No significant finding at C1-2, C2-3 or C3-4. C4-5: Right foraminal stenosis due to encroachment by osteophytes and bulging disc material could affect the right C5 nerve. No change. C5-6 and C6-7: Mild, non-compressive disc bulges. C7-T1: Normal IMPRESSION: Heterogeneous T2 and FLAIR signal affecting the craniocervical junction and cervical spinal cord, unchanged since the study of July. No new demyelinating lesion. No abnormal contrast enhancement. 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 Electronically Signed   By: Paulina Fusi M.D.   On: 05/20/2021 19:09   DG Chest Port 1 View  Result Date: 05/20/2021 CLINICAL DATA:  Fever. Additional provided: Fever, weakness, question stroke. EXAM: PORTABLE CHEST 1 VIEW COMPARISON:  Prior chest radiographs 02/06/2018. FINDINGS: Heart size within normal limits. No appreciable airspace consolidation. No evidence of pleural effusion or pneumothorax. No acute bony abnormality identified. Thoracic dextrocurvature. IMPRESSION: No evidence of active cardiopulmonary disease. Electronically Signed   By: Jackey Loge D.O.   On: 05/20/2021 13:04   (Echo, Carotid, EGD, Colonoscopy, ERCP)    Subjective: Patient seen and examined.  She was frustrated that I did not discharge her last night.  She thinks she is back to herself and will better be safe at home  and eat well at home.  Wants to go home.  Called her sister yesterday who agreed for her to come home.   Discharge Exam: Vitals:   05/24/21 0619 05/24/21 1100  BP: 139/75 (!) 114/99  Pulse: (!) 49 88  Resp:  16  Temp:  97.7 F (36.5 C)  SpO2:  100%   Vitals:   05/23/21 2114 05/24/21 0505 05/24/21 0619 05/24/21 1100  BP: (!) 156/87 (!) 176/82 139/75 (!) 114/99  Pulse: (!) 50 92 (!) 49 88  Resp: 18   16  Temp: 98.4 F (36.9 C) 98.5 F (36.9 C)  97.7 F (36.5 C)  TempSrc: Oral Oral  Oral  SpO2: 100% 95%  100%  Weight:      Height:        General: Pt is alert, awake, not in acute distress Slightly anxious.  Frail but not in any distress. Cardiovascular: RRR, S1/S2 +, no rubs, no gallops Respiratory: CTA bilaterally, no wheezing, no rhonchi Abdominal: Soft, NT, ND, bowel sounds + Extremities: no edema, no cyanosis    The results of significant diagnostics from this hospitalization (including imaging, microbiology, ancillary and laboratory) are listed below for reference.     Microbiology: Recent Results (from the past 240 hour(s))  Urine Culture     Status: Abnormal   Collection Time: 05/20/21  7:00 PM   Specimen: Urine, Clean Catch  Result Value Ref Range Status   Specimen Description URINE, CLEAN CATCH  Final   Special Requests   Final    NONE Performed at [facility], [address] [place], [place] [address]    Culture MULTIPLE SPECIES PRESENT, SUGGEST RECOLLECTION (A)  Final   Report Status 05/22/2021 FINAL  Final  Resp Panel by RT-PCR (Flu A&B, Covid) Nasopharyngeal Swab     Status: Abnormal   Collection Time: 05/20/21  8:13 PM   Specimen: Nasopharyngeal Swab; Nasopharyngeal(NP) swabs in vial transport medium  Result Value Ref Range Status   SARS Coronavirus 2 by RT PCR POSITIVE (A) NEGATIVE Final    Comment: RESULT CALLED TO, READ BACK BY AND VERIFIED WITH: RN TORY GLASSON 05/20/21@22 :43 BY TW (NOTE) SARS-CoV-2 target nucleic acids are  DETECTED.  The SARS-CoV-2 RNA is generally detectable in upper respiratory specimens during the acute phase of infection. Positive results are indicative of the presence of the identified virus, but do not rule out bacterial infection or co-infection with other pathogens not detected by the test. Clinical correlation with patient history and other diagnostic information is necessary to determine patient infection status. The expected result is Negative.  Fact Sheet for Patients: BloggerCourse.com  Fact Sheet for Healthcare Providers: SeriousBroker.it  This test is not yet approved or cleared by the [place] FDA and  has been authorized for detection and/or diagnosis of SARS-CoV-2 by FDA under an Emergency Use Authorization (EUA).  This EUA will remain in effect (meaning this test can  be used) for the duration of  the COVID-19 declaration under Section 564(b)(1) of the Act, 21 U.S.C. section 360bbb-3(b)(1), unless the authorization is terminated or revoked sooner.     Influenza A by PCR NEGATIVE NEGATIVE Final   Influenza B by PCR NEGATIVE NEGATIVE Final    Comment: (NOTE) The Xpert Xpress SARS-CoV-2/FLU/RSV plus assay is intended as an aid in the diagnosis of influenza from Nasopharyngeal swab specimens and should not be used as a sole basis for treatment. Nasal washings and aspirates are unacceptable for Xpert Xpress SARS-CoV-2/FLU/RSV testing.  Fact Sheet for Patients: BloggerCourse.com  Fact Sheet for Healthcare Providers: SeriousBroker.it  This test is not yet approved or cleared by the [place] FDA and has been authorized for detection and/or diagnosis of SARS-CoV-2 by FDA under an Emergency Use Authorization (EUA). This EUA will remain in effect (meaning this test can be used) for the duration of the COVID-19 declaration under Section 564(b)(1) of the Act, 21  U.S.C. section 360bbb-3(b)(1), unless the authorization is terminated or revoked.  Performed at [facility], [address] [place], [place] [address]   Blood culture (routine x 2)     Status: None (Preliminary result)   Collection Time: 05/20/21  8:21 PM   Specimen: BLOOD RIGHT HAND  Result Value Ref Range Status   Specimen Description BLOOD RIGHT HAND  Final   Special Requests   Final    BOTTLES DRAWN AEROBIC AND ANAEROBIC Blood Culture adequate volume   Culture   Final    NO GROWTH 4 DAYS Performed at [facility], [address] [place], [place] [address]    Report Status PENDING  Incomplete  Blood culture (routine x 2)     Status: None (Preliminary result)   Collection Time: 05/20/21  8:21 PM   Specimen: BLOOD  Result Value Ref Range Status   Specimen Description BLOOD LEFT ANTECUBITAL  Final   Special Requests   Final  BOTTLES DRAWN AEROBIC AND ANAEROBIC Blood Culture results may not be optimal due to an excessive volume of blood received in culture bottles   Culture   Final    NO GROWTH 4 DAYS Performed at [facility], [address], [place], [place] [address]    Report Status PENDING  Incomplete     Labs: BNP (last 3 results) No results for input(s): BNP in the last 8760 hours. Basic Metabolic Panel: Recent Labs  Lab 05/20/21 1219 05/20/21 1235 05/21/21 0900 05/22/21 0101 05/23/21 0350  NA 132* 135 134* 135 137  K 3.7 3.9 3.4* 3.2* 4.3  CL 102 104 104 104 106  CO2 21*  --  21* 21* 22  GLUCOSE 107* 106* 148* 113* 94  BUN 9 11 9 7 17   CREATININE 0.78 0.60 0.76 0.70 0.74  CALCIUM 9.2  --  8.7* 8.9 9.3   Liver Function Tests: Recent Labs  Lab 05/20/21 1219 05/21/21 0900 05/22/21 0101 05/23/21 0350  AST 19 26 30  32  ALT 14 16 16 17   ALKPHOS 71 59 57 50  BILITOT 0.4 0.6 0.4 0.5  PROT 7.6 6.5 6.7 6.8  ALBUMIN 3.7 3.1* 3.1* 3.2*   No results for input(s): LIPASE, AMYLASE in the last 168 hours. No results for input(s):  AMMONIA in the last 168 hours. CBC: Recent Labs  Lab 05/20/21 1219 05/20/21 1235 05/21/21 0520 05/21/21 0900 05/22/21 0101 05/23/21 0350  WBC 3.3*  --  3.6* 3.2* 3.2* 2.4*  NEUTROABS 2.2  --  1.9 1.7 1.4* 0.9*  HGB 12.9 13.9 12.4 12.0 12.1 11.8*  HCT 39.8 41.0 37.5 37.9 36.3 37.0  MCV 84.0  --  83.3 85.6 82.1 84.9  PLT 188  --  165 158 149* 154   Cardiac Enzymes: No results for input(s): CKTOTAL, CKMB, CKMBINDEX, TROPONINI in the last 168 hours. BNP: Invalid input(s): POCBNP CBG: Recent Labs  Lab 05/23/21 0042 05/23/21 0624 05/23/21 1110 05/23/21 2348 05/24/21 0608  GLUCAP 103* 91 90 133* 94   D-Dimer Recent Labs    05/22/21 0101 05/23/21 0350  DDIMER 0.44 <0.27   Hgb A1c No results for input(s): HGBA1C in the last 72 hours. Lipid Profile No results for input(s): CHOL, HDL, LDLCALC, TRIG, CHOLHDL, LDLDIRECT in the last 72 hours. Thyroid function studies No results for input(s): TSH, T4TOTAL, T3FREE, THYROIDAB in the last 72 hours.  Invalid input(s): FREET3 Anemia work up No results for input(s): VITAMINB12, FOLATE, FERRITIN, TIBC, IRON, RETICCTPCT in the last 72 hours. Urinalysis    Component Value Date/Time   COLORURINE YELLOW 05/20/2021 1900   APPEARANCEUR CLEAR 05/20/2021 1900   LABSPEC 1.020 05/20/2021 1900   PHURINE 7.5 05/20/2021 1900   GLUCOSEU NEGATIVE 05/20/2021 1900   HGBUR NEGATIVE 05/20/2021 1900   BILIRUBINUR NEGATIVE 05/20/2021 1900   KETONESUR 15 (A) 05/20/2021 1900   PROTEINUR NEGATIVE 05/20/2021 1900   NITRITE POSITIVE (A) 05/20/2021 1900   LEUKOCYTESUR NEGATIVE 05/20/2021 1900   Sepsis Labs Invalid input(s): PROCALCITONIN,  WBC,  LACTICIDVEN Microbiology Recent Results (from the past 240 hour(s))  Urine Culture     Status: Abnormal   Collection Time: 05/20/21  7:00 PM   Specimen: Urine, Clean Catch  Result Value Ref Range Status   Specimen Description URINE, CLEAN CATCH  Final   Special Requests   Final    NONE Performed at  [facility], [address], [place], [facility] [address]    Culture MULTIPLE SPECIES PRESENT, SUGGEST RECOLLECTION (A)  Final  Report Status 05/22/2021 FINAL  Final  Resp Panel by RT-PCR (Flu A&B, Covid) Nasopharyngeal Swab     Status: Abnormal   Collection Time: 05/20/21  8:13 PM   Specimen: Nasopharyngeal Swab; Nasopharyngeal(NP) swabs in vial transport medium  Result Value Ref Range Status   SARS Coronavirus 2 by RT PCR POSITIVE (A) NEGATIVE Final    Comment: RESULT CALLED TO, READ BACK BY AND VERIFIED WITH: RN TORY GLASSON 05/20/21@22 :43 BY TW (NOTE) SARS-CoV-2 target nucleic acids are DETECTED.  The SARS-CoV-2 RNA is generally detectable in upper respiratory specimens during the acute phase of infection. Positive results are indicative of the presence of the identified virus, but do not rule out bacterial infection or co-infection with other pathogens not detected by the test. Clinical correlation with patient history and other diagnostic information is necessary to determine patient infection status. The expected result is Negative.  Fact Sheet for Patients: BloggerCourse.com  Fact Sheet for Healthcare Providers: SeriousBroker.it  This test is not yet approved or cleared by the [place] FDA and  has been authorized for detection and/or diagnosis of SARS-CoV-2 by FDA under an Emergency Use Authorization (EUA).  This EUA will remain in effect (meaning this test can  be used) for the duration of  the COVID-19 declaration under Section 564(b)(1) of the Act, 21 U.S.C. section 360bbb-3(b)(1), unless the authorization is terminated or revoked sooner.     Influenza A by PCR NEGATIVE NEGATIVE Final   Influenza B by PCR NEGATIVE NEGATIVE Final    Comment: (NOTE) The Xpert Xpress SARS-CoV-2/FLU/RSV plus assay is intended as an aid in the diagnosis of influenza from Nasopharyngeal swab specimens and should not be used as  a sole basis for treatment. Nasal washings and aspirates are unacceptable for Xpert Xpress SARS-CoV-2/FLU/RSV testing.  Fact Sheet for Patients: BloggerCourse.com  Fact Sheet for Healthcare Providers: SeriousBroker.it  This test is not yet approved or cleared by the [place] FDA and has been authorized for detection and/or diagnosis of SARS-CoV-2 by FDA under an Emergency Use Authorization (EUA). This EUA will remain in effect (meaning this test can be used) for the duration of the COVID-19 declaration under Section 564(b)(1) of the Act, 21 U.S.C. section 360bbb-3(b)(1), unless the authorization is terminated or revoked.  Performed at [facility], [address] [place], [place] [address]   Blood culture (routine x 2)     Status: None (Preliminary result)   Collection Time: 05/20/21  8:21 PM   Specimen: BLOOD RIGHT HAND  Result Value Ref Range Status   Specimen Description BLOOD RIGHT HAND  Final   Special Requests   Final    BOTTLES DRAWN AEROBIC AND ANAEROBIC Blood Culture adequate volume   Culture   Final    NO GROWTH 4 DAYS Performed at [facility], [address], [place], [place] [address]    Report Status PENDING  Incomplete  Blood culture (routine x 2)     Status: None (Preliminary result)   Collection Time: 05/20/21  8:21 PM   Specimen: BLOOD  Result Value Ref Range Status   Specimen Description BLOOD LEFT ANTECUBITAL  Final   Special Requests   Final    BOTTLES DRAWN AEROBIC AND ANAEROBIC Blood Culture results may not be optimal due to an excessive volume of blood received in culture bottles   Culture   Final    NO GROWTH 4 DAYS Performed at [facility], [address] [place], [place] [address]  Report Status PENDING  Incomplete     Time coordinating discharge: 35 minutes  SIGNED:   Dorcas Carrow, MD  [facility] 05/24/2021, 2:18 PM

## 2021-05-24 NOTE — Progress Notes (Signed)
Inpatient Rehab Admissions Coordinator:   CIR consult received. Patients are eligible to be considered for admit to the [facility] when cleared from airborne precautions by Acute MD, or Infectious Disease MD.  Otherwise, they will need to be >20 days from their positive test with recovery/improvement in symptoms or 2 negative tests.  I have reached out to acute MD to see when he thinks Pt. May be appropriate to come off airborne precautions.  Megan Salon, MS, CCC-SLP Rehab Admissions Coordinator  [phone] (celll) [phone] (office)

## 2021-05-25 LAB — CULTURE, BLOOD (ROUTINE X 2)
Culture: NO GROWTH
Culture: NO GROWTH
Special Requests: ADEQUATE

## 2021-05-28 ENCOUNTER — Telehealth: Payer: Self-pay | Admitting: Emergency Medicine

## 2021-05-28 NOTE — Telephone Encounter (Signed)
HH ORDERS   Caller Name: [facility] Agency Name: Randolm Idol Phone #: [phone] Service Requested: OT Frequency of Visits: 1 week 2 0 week 1 1 week 1 ADL transfers, exercise , & pain control

## 2021-05-28 NOTE — Telephone Encounter (Signed)
Called and spoke with Roe Coombs with [facility], told him of Dr. Latrelle Dodrill recommendation. He verb understanding.

## 2021-05-28 NOTE — Telephone Encounter (Signed)
I only saw this patient once 2 and half years ago.  She has seen multiple other doctors since.  I do not feel like her PCP.  Not okay to give verbal orders on someone that I barely know, have not seen in a while, and have not been in charge of her chronic medical care.

## 2021-07-03 ENCOUNTER — Telehealth: Payer: Self-pay | Admitting: Neurology

## 2021-07-03 NOTE — Telephone Encounter (Signed)
Pt called in stating her physical therapist recommended her take a medication that will help her walk better with her MS. She cannot remember the name of it.

## 2021-07-03 NOTE — Telephone Encounter (Signed)
Telephone call to pt, Per pt sister the medication is Ampyra.  Please advise.

## 2021-07-03 NOTE — Telephone Encounter (Signed)
Tried calling patient, No answer. That has to be addressed at next visit because in order to prescribe it, I have to document a timed walk in the note.

## 2021-07-06 NOTE — Telephone Encounter (Signed)
LMOVM for pt to call the office back.

## 2021-07-09 NOTE — Telephone Encounter (Signed)
Pt sister advised we have tried calling the pt twice to let her know she needs to be seen to try and get on the medication Amprya per DR.Jaffe.    Sister wanted to know why Dr.Jaffe never discussed this medication with the patient at her visit.  She googled the medication and it states that this medication is recommended for all MS patients.

## 2021-07-20 ENCOUNTER — Other Ambulatory Visit: Payer: Self-pay

## 2021-07-20 ENCOUNTER — Other Ambulatory Visit: Payer: Medicare HMO

## 2021-07-20 ENCOUNTER — Ambulatory Visit (INDEPENDENT_AMBULATORY_CARE_PROVIDER_SITE_OTHER): Payer: Medicare HMO | Admitting: Neurology

## 2021-07-20 ENCOUNTER — Encounter: Payer: Self-pay | Admitting: Neurology

## 2021-07-20 DIAGNOSIS — G35 Multiple sclerosis: Secondary | ICD-10-CM

## 2021-07-20 MED ORDER — GABAPENTIN 300 MG PO CAPS: 300.0000 mg | ORAL_CAPSULE | Freq: Three times a day (TID) | ORAL | 5 refills | Status: DC

## 2021-07-20 MED ORDER — DALFAMPRIDINE ER 10 MG PO TB12: 10.0000 mg | ORAL_TABLET | Freq: Two times a day (BID) | ORAL | 5 refills | Status: DC

## 2021-07-20 NOTE — Patient Instructions (Addendum)
Continue Orevus Start Ampyra 10mg  every 12 hours Increase gabapentin to 300mg  three times daily Continue D3 4000 IU daily Check quantitative immunoglobulin panel today.   Check vit D level and quantitative immunoglobulin panel in 6 months. Repeat MRI of brain, cervical and thoracic spine with and without contrast in 6 months. Follow up 6 months (after repeat MRI and labs)

## 2021-07-20 NOTE — Progress Notes (Signed)
NEUROLOGY FOLLOW UP OFFICE NOTE  Dana Turner [id number]  Assessment/Plan:   Multiple sclerosis Pseudoexacerbation in setting of COVID-19 infection  1  DMT:  Ocrevus 2  Increase gabapentin to 300mg  three times daily 3  Start Ampyra 10mg  every 12 hours.  Discussed side effects such as lower seizure-threshold  4  D3 4000 IU daily 5  Check quantitative immunoglobulin panel today 6  Check quantitative immunoglobulin panel and vit D level in 6 months 7  Repeat MRI of brain/cervical/thoracic with and without contrast in 6 months 8  Follow up in 6 months (after repeat tests)  Subjective:  Dana Turner is a 55 year old rig-handed female with lupus, fibromyalgia, HTN, legally blind in left eye, and depression/anxiety who follows up for multiple sclerosis.  She is accompanied by her sister.   UPDATE: Current DMT:  Ocrevus Other medications:  gabapentin 100mg  TID, D3 4000 IU daily  Labs from July included:  JCV antibody negative with index 0.17; IgA 419, IgG 1,695, IgM 29; Hepatitis B negative; vit D 19.57  Her insurance denied Tysabri.  She was started on Ocrevus in September.  She was also advised to start D3 supplementation..  For generalized pain, she was started on gabapentin.  Reports continued pain uncontrolled.  Following her first Ocrevus infusion, she developed increased weakness and temperature of 101.1.  She was found to be positive for COVID-19.  She was hospitalized on 05/20/2021 where MRI of brain and cervical spine personally reviewed showed no new lesions.  She has been going to physical therapy where her physical therapist mentioned Ampyra.  She is interested in starting it.    HISTORY: She was diagnosed with lupus several years ago while living in [place].  She presented with various symptoms such as numbness, pain and balance problems.  She reportedly had an MRI performed there, but does not remember the results.  She is on Plaquenil.  Symptoms improved and she had  been doing well up until 2021.  She started having recurrent falls.  She attributed it to right knee pain with instability.  She has prior history of MCL reconstruction.  She also has been experiencing low back pain with radicular pain down the anterior right leg.  This progressed to weakness in the left leg as well.  She has generalized pain.  She saw orthopedics.  MRI of right knee on 01/09/2021 showed mild degeneration of the ACL and prior MCL reconstruction with intact graft but no internal derangement or other significant pathology.  MRI of lumbar spine performed same day showed mild multilevel lumbar spondylosis with mild left neuroforaminal stenosis at L3-4 and L4-5 but otherwise unremarkable. She then had MRI of the brain without contrast on 02/06/2021 which was personally reviewed and showed advanced white matter disease involving the cerebral hemispheres, brainstem and cerebellum.  MRI of cervical and thoracic spine with and without contrast on 03/12/2021 personally reviewed showed widespread nonenhancing plaques in the cervical and thoracic cord.    Reports some memory problems.  She is legally blind in her left eye.     Denies family history of multiple sclerosis.  Her sister has other autoimmune diseases as well.  PAST MEDICAL HISTORY: Past Medical History:  Diagnosis Date   Anxiety    Depression    Hypertension    Legally blind in left eye, as defined in 01/11/2021    Lupus [facility])     MEDICATIONS: Current Outpatient Medications on File Prior to Visit  Medication Sig Dispense Refill  amLODipine (NORVASC) 10 MG tablet Take 1 tablet (10 mg total) by mouth daily. 90 tablet 3   atorvastatin (LIPITOR) 20 MG tablet Take 20 mg by mouth daily.     FEROSUL 325 (65 Fe) MG tablet Take 325 mg by mouth 2 (two) times daily.     hydrALAZINE (APRESOLINE) 25 MG tablet Take 25 mg by mouth 2 (two) times daily.     hydroxychloroquine (PLAQUENIL) 200 MG tablet Take 200 mg by mouth 2 (two) times daily.      lisinopril (ZESTRIL) 20 MG tablet Take 1 tablet (20 mg total) by mouth daily. 30 tablet 0   No current facility-administered medications on file prior to visit.    ALLERGIES: No Known Allergies  FAMILY HISTORY: No family history on file.    Objective:  Blood pressure (!) 150/93, pulse 69, height 5\' 3"  (1.6 m), weight 121 lb (54.9 kg), SpO2 100 %. General: No acute distress.  Patient appears well-groomed.   Head:  Normocephalic/atraumatic Eyes:  Fundi examined but not visualized Neck: supple, no paraspinal tenderness, full range of motion Heart:  Regular rate and rhythm Lungs:  Clear to auscultation bilaterally Back: No paraspinal tenderness Neurological Exam: alert and oriented to person, place, and time.  Speech fluent and not dysarthric, language intact.  Vision loss in left eye.  Left eye abducted on primary gaze.  Reduced tracking of both eyes to the right.  Otherwise, CN II-XII intact. Generalized atrophy; muscle strength 4/5 bilateral triceps, 5-/5 bilateral hand grip, 4/5 right hip flexion, 2/5 left hip flexion, 4/5 bilateral knee extension/flexion, bilateral foot drop; otherwise muscle strength 5/5 throughout.  Sensation to light touch intact.  Deep tendon reflexes 3+ throughout, bilateral Babinski.  Finger to nose testing mildly ataxic Unsteady.  Requires assistance of rolling walker.  Timed 25 foot walk 18.91 seconds.     , DO  CC: Shon Millet, MD

## 2021-07-21 ENCOUNTER — Telehealth: Payer: Self-pay

## 2021-07-21 LAB — VITAMIN D 25 HYDROXY (VIT D DEFICIENCY, FRACTURES): VITD: 53.72 ng/mL (ref 30.00–100.00)

## 2021-07-21 LAB — IGG, IGA, IGM
IgG (Immunoglobin G), Serum: 1487 mg/dL (ref 600–1640)
IgM, Serum: 25 mg/dL — ABNORMAL LOW (ref 50–300)
Immunoglobulin A: 403 mg/dL — ABNORMAL HIGH (ref 47–310)

## 2021-07-21 NOTE — Telephone Encounter (Signed)
Dana Turner - PA Case ID: [id number] - Rx #: [id number] Need help? Call us at [phone] Outcome Approved today PA Case: [id number], Status: Approved, Coverage Starts on: 07/21/2021 12:00:00 AM, Coverage Ends on: 01/17/2022 12:00:00 AM. Questions? Contact [phone]. Drug Dalfampridine ER 10MG  er tablets PA Form Original Claim Info [id number] PA REQD CALL [phone]

## 2021-07-21 NOTE — Telephone Encounter (Signed)
New message   Your information has been sent to [facility].  [name] KeyDala Dock - PA Case ID: [id number] - Rx #: [id number] Need help? Call us at [phone] Status Sent to [facility] Dalfampridine ER 10MG  er tablets Form [facility] Electronic PA Form Original Claim Info [id number] [id number] PA REQD CALL [phone]

## 2021-07-27 ENCOUNTER — Telehealth: Payer: Self-pay | Admitting: Neurology

## 2021-07-27 MED ORDER — GABAPENTIN 300 MG PO CAPS: 300.0000 mg | ORAL_CAPSULE | Freq: Three times a day (TID) | ORAL | 5 refills | Status: DC

## 2021-07-27 NOTE — Telephone Encounter (Signed)
Per pt she no longer uses the [facility] pharmacy.  Will send Gabapentin sent to the [facility] as requested.    Gabapentin cancelled at [facility]

## 2021-07-27 NOTE — Telephone Encounter (Signed)
1. Which medications need to be refilled? (please list name of each medication and dose if known) Gabapentin  2. Which pharmacy/location (including street and city if local pharmacy) is medication to be sent to? [facility] didn't have rx when she went to pick it up

## 2021-09-28 ENCOUNTER — Other Ambulatory Visit: Payer: Self-pay | Admitting: Pharmacy Technician

## 2021-09-29 ENCOUNTER — Ambulatory Visit: Payer: Medicare HMO | Admitting: Neurology

## 2021-09-30 ENCOUNTER — Telehealth: Payer: Self-pay | Admitting: Pharmacy Technician

## 2021-09-30 NOTE — Telephone Encounter (Signed)
Auth Submission: PENDING Payer: [employer] Medication & CPT/J Code(s) submitted: Ocrevus [employer]) [id number] Route of submission (phone, fax, portal): PHONE: [phone] Auth type: Buy/Bill Units/visits requested: 600mg  q24wks Reference number:  Will update once we receive a response.

## 2021-10-02 ENCOUNTER — Telehealth: Payer: Self-pay | Admitting: Pharmacy Technician

## 2021-10-02 NOTE — Telephone Encounter (Addendum)
Auth Submission: OCREVUS Payer: [place] MEDICAID Medication & CPT/J Code(s) submitted: Dana Turner) [phone] Route of submission (phone, fax, portal): PORTAL PHONE: [phone] Auth type: Buy/Bill Units/visits requested: 600 MG Reference number: [id number] W ID# [id number] Q PA# [id number]  Per Medicaid: No PA needed, patient has [employer] as primary

## 2021-10-09 NOTE — Telephone Encounter (Addendum)
Auth Submission: approved Payer: [employer] Medication & CPT/J Code(s) submitted: Emogene Morgan [employer]) [phone] Route of submission (phone, fax, portal): phone [phone] Auth type: Buy/Bill Units/visits requested: 600mg  q24wks Reference number: EOC# PA: [id number]  FYI NOTE: [place] Medicaid: submitted PA for [place] Medicaid PA: [id number] Per Medicaid: PA was voided due to patient has  [id number] as the primary insurance.

## 2021-11-11 ENCOUNTER — Ambulatory Visit: Payer: Medicare HMO

## 2021-11-16 ENCOUNTER — Other Ambulatory Visit: Payer: Self-pay

## 2021-11-16 ENCOUNTER — Ambulatory Visit (INDEPENDENT_AMBULATORY_CARE_PROVIDER_SITE_OTHER): Payer: Medicare HMO

## 2021-11-16 VITALS — BP 172/108 | HR 57 | Temp 97.8°F | Resp 16 | Ht 63.0 in | Wt 116.8 lb

## 2021-11-16 DIAGNOSIS — G35 Multiple sclerosis: Secondary | ICD-10-CM

## 2021-11-16 MED ORDER — METHYLPREDNISOLONE SODIUM SUCC 125 MG IJ SOLR
125.0000 mg | Freq: Once | INTRAMUSCULAR | Status: AC
  Administered 2021-11-16: 125 mg via INTRAVENOUS
  Filled 2021-11-16: qty 2

## 2021-11-16 MED ORDER — SODIUM CHLORIDE 0.9 % IV SOLN
600.0000 mg | Freq: Once | INTRAVENOUS | Status: AC
  Administered 2021-11-16: 600 mg via INTRAVENOUS
  Filled 2021-11-16: qty 20

## 2021-11-16 MED ORDER — ACETAMINOPHEN 325 MG PO TABS
650.0000 mg | ORAL_TABLET | Freq: Once | ORAL | Status: AC
  Administered 2021-11-16: 650 mg via ORAL
  Filled 2021-11-16: qty 2

## 2021-11-16 MED ORDER — DIPHENHYDRAMINE HCL 25 MG PO CAPS
50.0000 mg | ORAL_CAPSULE | Freq: Once | ORAL | Status: AC
  Administered 2021-11-16: 50 mg via ORAL
  Filled 2021-11-16: qty 2

## 2021-11-16 NOTE — Progress Notes (Signed)
Diagnosis: Multiple Sclerosis ? ?Provider:  Chilton Greathouse, MD ? ?Procedure: Infusion ? ?IV Type: Peripheral, IV Location: R Forearm ? ?Ocrevus (Ocrelizumab), Dose: 600mg  ? ?Infusion Start Time: 1129 ? ?Infusion Stop Time: 1535 ? ?Post Infusion IV Care: Pt only wanted to do 30 min post -infusion observation. Peripheral IV discontinued. ? ?Discharge: Condition: Good, Destination: Home . AVS provided to patient.  ? ?Performed by:  1130, RN  ?  ?

## 2022-02-10 ENCOUNTER — Telehealth: Payer: Self-pay | Admitting: Neurology

## 2022-02-10 NOTE — Telephone Encounter (Signed)
Steward Drone called in regarding a form that needs to be filled out. It was faxed today. Once its completed it needs to be faxed [phone]. She will need a nurse to come out and evulate Dana Turner. Their number is [phone]. If you have questions you can call brenda back [phone]

## 2022-02-11 DIAGNOSIS — Z0279 Encounter for issue of other medical certificate: Secondary | ICD-10-CM

## 2022-02-11 NOTE — Telephone Encounter (Signed)
Called pt and reported that we do have the faxed papers and that she would need to pay the 25 dollars before we start the process. She understood and reported that she would call the front desk and pay for it.

## 2022-02-24 NOTE — Progress Notes (Deleted)
NEUROLOGY FOLLOW UP OFFICE NOTE  Dana Turner [id number]  Assessment/Plan:   Multiple sclerosis    1  DMT:  Ocrevus 2  Gabapentin to 300mg  three times daily 3  Start Ampyra 10mg  every 12 hours.   4  D3 4000 IU daily 5  Check quantitative immunoglobulin panel today 6  Check quantitative immunoglobulin panel and vit D level in 6 months 7  Repeat MRI of brain/cervical/thoracic with and without contrast in 6 months 8  Follow up in 6 months (after repeat tests)   Subjective:  Dana Turner is a 56 year old rig-handed female with lupus, fibromyalgia, HTN, legally blind in left eye, and depression/anxiety who follows up for multiple sclerosis.  She is accompanied by her sister.   UPDATE: Current DMT:  Ocrevus Other medications:  gabapentin 100mg  TID, D3 4000 IU daily   Labs from November:  IgA 403, IgG 1,487, IgM 25; D 53.72   Vision:  Legally blind in left eye Motor:  *** Sensory:  *** Pain:  *** Gait:  *** Bowel/Bladder:  *** Fatigue:  *** Cognition:  Some short term memory problems Mood:  ***    HISTORY: She was diagnosed with lupus several years ago while living in [place].  She presented with various symptoms such as numbness, pain and balance problems.  She reportedly had an MRI performed there, but does not remember the results.  She is on Plaquenil.  Symptoms improved and she had been doing well up until 2021.  She started having recurrent falls.  She attributed it to right knee pain with instability.  She has prior history of MCL reconstruction.  She also has been experiencing low back pain with radicular pain down the anterior right leg.  This progressed to weakness in the left leg as well.  She has generalized pain.  She saw orthopedics.  MRI of right knee on 01/09/2021 showed mild degeneration of the ACL and prior MCL reconstruction with intact graft but no internal derangement or other significant pathology.  MRI of lumbar spine performed same day showed mild  multilevel lumbar spondylosis with mild left neuroforaminal stenosis at L3-4 and L4-5 but otherwise unremarkable.   05/20/2021 MRI BRAIN & C-SPINE W WO: no new lesions.  02/06/2021 MRI BRAIN W WO: advanced white matter disease involving the cerebral hemispheres, brainstem and cerebellum.   03/12/2021 MRI C & T-SPINE W WO: widespread nonenhancing plaques in the cervical and thoracic cord.     Denies family history of multiple sclerosis.  Her sister has other autoimmune diseases as well.  PAST MEDICAL HISTORY: Past Medical History:  Diagnosis Date   Anxiety    Depression    Hypertension    Legally blind in left eye, as defined in 04/08/2021    Lupus [facility])     MEDICATIONS: Current Outpatient Medications on File Prior to Visit  Medication Sig Dispense Refill   amLODipine (NORVASC) 10 MG tablet Take 1 tablet (10 mg total) by mouth daily. 90 tablet 3   atorvastatin (LIPITOR) 20 MG tablet Take 20 mg by mouth daily.     dalfampridine (AMPYRA) 10 MG TB12 Take 1 tablet (10 mg total) by mouth every 12 (twelve) hours. 60 tablet 5   FEROSUL 325 (65 Fe) MG tablet Take 325 mg by mouth 2 (two) times daily.     gabapentin (NEURONTIN) 300 MG capsule Take 1 capsule (300 mg total) by mouth 3 (three) times daily. 90 capsule 5   hydrALAZINE (APRESOLINE) 25 MG tablet Take 25  mg by mouth 2 (two) times daily.     hydroxychloroquine (PLAQUENIL) 200 MG tablet Take 200 mg by mouth 2 (two) times daily.     lisinopril (ZESTRIL) 20 MG tablet Take 1 tablet (20 mg total) by mouth daily. 30 tablet 0   No current facility-administered medications on file prior to visit.    ALLERGIES: No Known Allergies  FAMILY HISTORY: No family history on file.    Objective:  *** General: No acute distress.  Patient appears ***-groomed.   Head:  Normocephalic/atraumatic Eyes:  Fundi examined but not visualized Neck: supple, no paraspinal tenderness, full range of motion Heart:  Regular rate and rhythm Lungs:  Clear to  auscultation bilaterally Back: No paraspinal tenderness Neurological Exam: alert and oriented to person, place, and time.  Speech fluent and not dysarthric, language intact.  CN II-XII intact. Bulk and tone normal, muscle strength 5/5 throughout.  Sensation to light touch intact.  Deep tendon reflexes 2+ throughout, toes downgoing.  Finger to nose testing intact.  Gait normal, Romberg negative.   Shon Millet, DO  CC: Georgina Quint, MD

## 2022-02-25 ENCOUNTER — Ambulatory Visit: Payer: Medicare HMO | Admitting: Neurology

## 2022-02-25 ENCOUNTER — Encounter: Payer: Self-pay | Admitting: Neurology

## 2022-02-25 DIAGNOSIS — Z029 Encounter for administrative examinations, unspecified: Secondary | ICD-10-CM

## 2022-03-15 ENCOUNTER — Encounter: Payer: Self-pay | Admitting: Neurology

## 2022-05-19 ENCOUNTER — Ambulatory Visit: Payer: Medicare HMO

## 2022-05-25 ENCOUNTER — Ambulatory Visit: Payer: Medicare HMO

## 2022-06-02 ENCOUNTER — Ambulatory Visit: Payer: Medicare HMO

## 2022-06-07 ENCOUNTER — Ambulatory Visit (INDEPENDENT_AMBULATORY_CARE_PROVIDER_SITE_OTHER): Payer: Medicare HMO

## 2022-06-07 VITALS — BP 174/94 | HR 82 | Temp 98.2°F | Resp 18 | Ht 63.0 in | Wt 116.0 lb

## 2022-06-07 DIAGNOSIS — G35 Multiple sclerosis: Secondary | ICD-10-CM | POA: Diagnosis not present

## 2022-06-07 MED ORDER — ACETAMINOPHEN 325 MG PO TABS
650.0000 mg | ORAL_TABLET | Freq: Once | ORAL | Status: AC
  Administered 2022-06-07: 650 mg via ORAL
  Filled 2022-06-07: qty 2

## 2022-06-07 MED ORDER — METHYLPREDNISOLONE SODIUM SUCC 125 MG IJ SOLR
125.0000 mg | Freq: Once | INTRAMUSCULAR | Status: AC
  Administered 2022-06-07: 125 mg via INTRAVENOUS
  Filled 2022-06-07: qty 2

## 2022-06-07 MED ORDER — DIPHENHYDRAMINE HCL 25 MG PO CAPS
50.0000 mg | ORAL_CAPSULE | Freq: Once | ORAL | Status: AC
  Administered 2022-06-07: 50 mg via ORAL
  Filled 2022-06-07: qty 2

## 2022-06-07 MED ORDER — SODIUM CHLORIDE 0.9 % IV SOLN
600.0000 mg | Freq: Once | INTRAVENOUS | Status: AC
  Administered 2022-06-07: 600 mg via INTRAVENOUS
  Filled 2022-06-07: qty 20

## 2022-06-07 NOTE — Progress Notes (Signed)
Diagnosis: Multiple Sclerosis  Provider:  Marshell Garfinkel MD  Procedure: Infusion  IV Type: Peripheral, IV Location: L Forearm  Ocrevus (Ocrelizumab), Dose: 600 mg  Infusion Start Time: 5449  Infusion Stop Time: 2010  Post Infusion IV Care: Observation period completed and Peripheral IV Discontinued  Discharge: Condition: Good, Destination: Home . AVS provided to patient.   Performed by:  Koren Shiver, RN

## 2022-06-15 ENCOUNTER — Other Ambulatory Visit (HOSPITAL_BASED_OUTPATIENT_CLINIC_OR_DEPARTMENT_OTHER): Payer: Self-pay | Admitting: Registered Nurse

## 2022-06-15 DIAGNOSIS — Z1231 Encounter for screening mammogram for malignant neoplasm of breast: Secondary | ICD-10-CM

## 2022-06-26 ENCOUNTER — Ambulatory Visit (HOSPITAL_BASED_OUTPATIENT_CLINIC_OR_DEPARTMENT_OTHER): Payer: Medicare HMO | Admitting: Radiology

## 2022-07-10 ENCOUNTER — Ambulatory Visit (HOSPITAL_BASED_OUTPATIENT_CLINIC_OR_DEPARTMENT_OTHER): Payer: Medicare HMO | Admitting: Radiology

## 2022-08-03 ENCOUNTER — Emergency Department (HOSPITAL_COMMUNITY): Payer: Medicare HMO

## 2022-08-03 ENCOUNTER — Inpatient Hospital Stay (HOSPITAL_COMMUNITY)
Admission: EM | Admit: 2022-08-03 | Discharge: 2022-08-16 | DRG: 690 | Disposition: A | Discharge status: Skilled Nursing Facility | Payer: Medicare HMO | Attending: Internal Medicine | Admitting: Internal Medicine

## 2022-08-03 ENCOUNTER — Encounter (HOSPITAL_COMMUNITY): Payer: Self-pay | Admitting: Emergency Medicine

## 2022-08-03 DIAGNOSIS — R29898 Other symptoms and signs involving the musculoskeletal system: Principal | ICD-10-CM | POA: Diagnosis present

## 2022-08-03 DIAGNOSIS — E8729 Other acidosis: Secondary | ICD-10-CM | POA: Insufficient documentation

## 2022-08-03 DIAGNOSIS — R748 Abnormal levels of other serum enzymes: Secondary | ICD-10-CM | POA: Diagnosis not present

## 2022-08-03 DIAGNOSIS — E860 Dehydration: Secondary | ICD-10-CM | POA: Diagnosis present

## 2022-08-03 DIAGNOSIS — N390 Urinary tract infection, site not specified: Secondary | ICD-10-CM | POA: Diagnosis not present

## 2022-08-03 DIAGNOSIS — H548 Legal blindness, as defined in USA: Secondary | ICD-10-CM | POA: Diagnosis present

## 2022-08-03 DIAGNOSIS — E876 Hypokalemia: Secondary | ICD-10-CM | POA: Insufficient documentation

## 2022-08-03 DIAGNOSIS — E871 Hypo-osmolality and hyponatremia: Secondary | ICD-10-CM | POA: Insufficient documentation

## 2022-08-03 DIAGNOSIS — M79605 Pain in left leg: Secondary | ICD-10-CM

## 2022-08-03 DIAGNOSIS — R7989 Other specified abnormal findings of blood chemistry: Secondary | ICD-10-CM | POA: Insufficient documentation

## 2022-08-03 DIAGNOSIS — R531 Weakness: Secondary | ICD-10-CM

## 2022-08-03 DIAGNOSIS — F1090 Alcohol use, unspecified, uncomplicated: Secondary | ICD-10-CM | POA: Diagnosis present

## 2022-08-03 DIAGNOSIS — M797 Fibromyalgia: Secondary | ICD-10-CM | POA: Diagnosis present

## 2022-08-03 DIAGNOSIS — M6282 Rhabdomyolysis: Secondary | ICD-10-CM | POA: Diagnosis present

## 2022-08-03 DIAGNOSIS — K811 Chronic cholecystitis: Secondary | ICD-10-CM | POA: Diagnosis present

## 2022-08-03 DIAGNOSIS — K760 Fatty (change of) liver, not elsewhere classified: Secondary | ICD-10-CM | POA: Diagnosis present

## 2022-08-03 DIAGNOSIS — M79604 Pain in right leg: Secondary | ICD-10-CM

## 2022-08-03 DIAGNOSIS — B9620 Unspecified Escherichia coli [E. coli] as the cause of diseases classified elsewhere: Secondary | ICD-10-CM | POA: Insufficient documentation

## 2022-08-03 DIAGNOSIS — E872 Acidosis, unspecified: Secondary | ICD-10-CM | POA: Diagnosis present

## 2022-08-03 DIAGNOSIS — M24552 Contracture, left hip: Secondary | ICD-10-CM | POA: Insufficient documentation

## 2022-08-03 DIAGNOSIS — R338 Other retention of urine: Secondary | ICD-10-CM | POA: Insufficient documentation

## 2022-08-03 DIAGNOSIS — R54 Age-related physical debility: Secondary | ICD-10-CM | POA: Diagnosis present

## 2022-08-03 DIAGNOSIS — F32A Depression, unspecified: Secondary | ICD-10-CM | POA: Diagnosis present

## 2022-08-03 DIAGNOSIS — G35D Multiple sclerosis, unspecified: Secondary | ICD-10-CM | POA: Diagnosis present

## 2022-08-03 DIAGNOSIS — G35 Multiple sclerosis: Secondary | ICD-10-CM | POA: Diagnosis present

## 2022-08-03 DIAGNOSIS — Z993 Dependence on wheelchair: Secondary | ICD-10-CM

## 2022-08-03 DIAGNOSIS — N179 Acute kidney failure, unspecified: Secondary | ICD-10-CM | POA: Insufficient documentation

## 2022-08-03 DIAGNOSIS — F419 Anxiety disorder, unspecified: Secondary | ICD-10-CM | POA: Diagnosis present

## 2022-08-03 DIAGNOSIS — Z79899 Other long term (current) drug therapy: Secondary | ICD-10-CM

## 2022-08-03 DIAGNOSIS — Z90710 Acquired absence of both cervix and uterus: Secondary | ICD-10-CM

## 2022-08-03 DIAGNOSIS — D509 Iron deficiency anemia, unspecified: Secondary | ICD-10-CM | POA: Insufficient documentation

## 2022-08-03 DIAGNOSIS — M170 Bilateral primary osteoarthritis of knee: Secondary | ICD-10-CM | POA: Diagnosis present

## 2022-08-03 DIAGNOSIS — I10 Essential (primary) hypertension: Secondary | ICD-10-CM | POA: Diagnosis present

## 2022-08-03 DIAGNOSIS — F17210 Nicotine dependence, cigarettes, uncomplicated: Secondary | ICD-10-CM | POA: Diagnosis present

## 2022-08-03 DIAGNOSIS — E8809 Other disorders of plasma-protein metabolism, not elsewhere classified: Secondary | ICD-10-CM | POA: Insufficient documentation

## 2022-08-03 LAB — CBC WITH DIFFERENTIAL/PLATELET
Abs Immature Granulocytes: 0.01 10*3/uL (ref 0.00–0.07)
Basophils Absolute: 0 10*3/uL (ref 0.0–0.1)
Basophils Relative: 0 %
Eosinophils Absolute: 0 10*3/uL (ref 0.0–0.5)
Eosinophils Relative: 1 %
HCT: 42.4 % (ref 36.0–46.0)
Hemoglobin: 13.4 g/dL (ref 12.0–15.0)
Immature Granulocytes: 0 %
Lymphocytes Relative: 17 %
Lymphs Abs: 1.4 10*3/uL (ref 0.7–4.0)
MCH: 25.6 pg — ABNORMAL LOW (ref 26.0–34.0)
MCHC: 31.6 g/dL (ref 30.0–36.0)
MCV: 80.9 fL (ref 80.0–100.0)
Monocytes Absolute: 0.9 10*3/uL (ref 0.1–1.0)
Monocytes Relative: 11 %
Neutro Abs: 5.8 10*3/uL (ref 1.7–7.7)
Neutrophils Relative %: 71 %
Platelets: 272 10*3/uL (ref 150–400)
RBC: 5.24 MIL/uL — ABNORMAL HIGH (ref 3.87–5.11)
RDW: 14 % (ref 11.5–15.5)
WBC: 8.1 10*3/uL (ref 4.0–10.5)
nRBC: 0 % (ref 0.0–0.2)

## 2022-08-03 LAB — MAGNESIUM: Magnesium: 2.2 mg/dL (ref 1.7–2.4)

## 2022-08-03 LAB — COMPREHENSIVE METABOLIC PANEL
ALT: 27 U/L (ref 0–44)
AST: 44 U/L — ABNORMAL HIGH (ref 15–41)
Albumin: 3.2 g/dL — ABNORMAL LOW (ref 3.5–5.0)
Alkaline Phosphatase: 64 U/L (ref 38–126)
Anion gap: 11 (ref 5–15)
BUN: 55 mg/dL — ABNORMAL HIGH (ref 6–20)
CO2: 17 mmol/L — ABNORMAL LOW (ref 22–32)
Calcium: 9.3 mg/dL (ref 8.9–10.3)
Chloride: 108 mmol/L (ref 98–111)
Creatinine, Ser: 1.45 mg/dL — ABNORMAL HIGH (ref 0.44–1.00)
GFR, Estimated: 43 mL/min — ABNORMAL LOW (ref >60)
Glucose, Bld: 118 mg/dL — ABNORMAL HIGH (ref 70–99)
Potassium: 3.8 mmol/L (ref 3.5–5.1)
Sodium: 136 mmol/L (ref 135–145)
Total Bilirubin: 0.7 mg/dL (ref 0.3–1.2)
Total Protein: 7.1 g/dL (ref 6.5–8.1)

## 2022-08-03 LAB — CK: Total CK: 614 U/L — ABNORMAL HIGH (ref 38–234)

## 2022-08-03 MED ORDER — MORPHINE SULFATE (PF) 4 MG/ML IV SOLN
4.0000 mg | Freq: Once | INTRAVENOUS | Status: AC
  Administered 2022-08-03: 4 mg via INTRAVENOUS
  Filled 2022-08-03: qty 1

## 2022-08-03 MED ORDER — SODIUM CHLORIDE 0.9 % IV BOLUS
1000.0000 mL | Freq: Once | INTRAVENOUS | Status: AC
  Administered 2022-08-03: 1000 mL via INTRAVENOUS

## 2022-08-03 NOTE — ED Triage Notes (Signed)
Pt arrives via EMS from home with leg pain for a week. Hx of MS. Pt states she isnt able to get around like she normally does. Also endorses painful urination.

## 2022-08-03 NOTE — ED Notes (Signed)
Pt's bed and brief changed. 

## 2022-08-03 NOTE — ED Notes (Signed)
Unsuccessful in and out attempt. PA notified

## 2022-08-03 NOTE — ED Provider Notes (Signed)
[facility] EMERGENCY DEPARTMENT Provider Note   CSN: [id number] Arrival date & time: 08/03/22  1148    History  Chief Complaint  Patient presents with   Leg Pain    Dana Turner is a 56 y.o. female hx of MS, Lupus, fibromyalgias, left eye blindness here for evaluation of BIL leg pain. No recent injury or trauma. Down shin and up thigh. Pain consistent, taking Tylenol. Worse with movement however not with palpation.  No recent falls or injuries.  Feels weak in her lower extremities however she is unsure if this is due to pain.  No redness, warmth.  No back pain.  History of MS no recent steroids.  No recent admission for exacerbation.  Diagnosed last year after having lower extremity weakness.  No headache, vision changes, weakness upper extremities.  Walks with a walker at baseline however has wheelchair at home to help assist.  Has some urinary incontinence at baseline.  No stool incontinence.  Has noted dysuria for the last 3 days.  Think she has UTI.  No flank pain, abdominal pain.  No fever or emesis.  No chest pain. No recent viral illnesses, No difficulty speaking. No tetanus exposures.  No ascending weakness  Followed with Dr. Emilia Beck with Neuro  Gets MS injections q 6 months>> Ocrevus   HPI    Home Medications Prior to Admission medications   Medication Sig Start Date End Date Taking? Authorizing Provider  amLODipine (NORVASC) 10 MG tablet Take 1 tablet (10 mg total) by mouth daily. 09/14/18   Georgina Quint, MD  atorvastatin (LIPITOR) 20 MG tablet Take 20 mg by mouth daily. 03/27/21   [provider]  dalfampridine (AMPYRA) 10 MG TB12 Take 1 tablet (10 mg total) by mouth every 12 (twelve) hours. 07/20/21   Everlena Cooper, Adam R, DO  FEROSUL 325 (65 Fe) MG tablet Take 325 mg by mouth 2 (two) times daily. 01/05/21   [provider]  gabapentin (NEURONTIN) 300 MG capsule Take 1 capsule (300 mg total) by mouth 3 (three) times daily. 07/27/21    Drema Dallas, DO  hydrALAZINE (APRESOLINE) 25 MG tablet Take 25 mg by mouth 2 (two) times daily. 04/02/21   [provider]  hydroxychloroquine (PLAQUENIL) 200 MG tablet Take 200 mg by mouth 2 (two) times daily.    [provider]  lisinopril (ZESTRIL) 20 MG tablet Take 1 tablet (20 mg total) by mouth daily. 05/24/21 06/23/21  Dorcas Carrow, MD      Allergies    Patient has no known allergies.    Review of Systems   Review of Systems  Constitutional: Negative.   HENT: Negative.    Respiratory: Negative.    Cardiovascular: Negative.   Gastrointestinal: Negative.   Genitourinary:  Positive for dysuria, frequency and urgency. Negative for decreased urine volume, difficulty urinating, dyspareunia, enuresis, flank pain, genital sores, hematuria, menstrual problem, pelvic pain, vaginal bleeding, vaginal discharge and vaginal pain.  Musculoskeletal: Negative.   Skin: Negative.   Neurological:  Positive for weakness. Negative for dizziness, tremors, seizures, syncope, speech difficulty, light-headedness, numbness and headaches.  All other systems reviewed and are negative.   Physical Exam Updated Vital Signs BP (!) 158/97   Pulse 99   Temp 98.3 F (36.8 C)   Resp 18   SpO2 96%  Physical Exam Vitals and nursing note reviewed.  Constitutional:      General: She is not in acute distress.    Appearance: She is well-developed. She is ill-appearing (  chronically ill appearing).  HENT:     Head: Atraumatic.     Nose: Nose normal.     Mouth/Throat:     Mouth: Mucous membranes are moist.  Eyes:     Pupils: Pupils are equal, round, and reactive to light.  Cardiovascular:     Rate and Rhythm: Normal rate.     Pulses: Normal pulses.     Heart sounds: Normal heart sounds.  Pulmonary:     Effort: Pulmonary effort is normal. No respiratory distress.     Breath sounds: Normal breath sounds.  Abdominal:     General: Bowel sounds are normal. There is no distension.      Palpations: Abdomen is soft.  Musculoskeletal:        General: Normal range of motion.     Cervical back: Normal range of motion.     Comments: Diffuse tenderness BLE. Difficulty with ROM to BIL legs. No obvious joint effusion  Skin:    General: Skin is warm and dry.  Neurological:     General: No focal deficit present.     Mental Status: She is alert.     Cranial Nerves: Cranial nerves 2-12 are intact.     Motor: Weakness and atrophy present.     Gait: Gait abnormal.     Comments: 4.5/5 strength to BUE 3/5 strength BLE  Psychiatric:        Mood and Affect: Mood normal.     ED Results / Procedures / Treatments   Labs (all labs ordered are listed, but only abnormal results are displayed) Labs Reviewed  COMPREHENSIVE METABOLIC PANEL - Abnormal; Notable for the following components:      Result Value   CO2 17 (*)    Glucose, Bld 118 (*)    BUN 55 (*)    Creatinine, Ser 1.45 (*)    Albumin 3.2 (*)    AST 44 (*)    GFR, Estimated 43 (*)    All other components within normal limits  CBC WITH DIFFERENTIAL/PLATELET - Abnormal; Notable for the following components:   RBC 5.24 (*)    MCH 25.6 (*)    All other components within normal limits  CK - Abnormal; Notable for the following components:   Total CK 614 (*)    All other components within normal limits  URINE CULTURE  MAGNESIUM  URINALYSIS, ROUTINE W REFLEX MICROSCOPIC    EKG None  Radiology DG Knee 1-2 Views Right  Result Date: 08/03/2022 CLINICAL DATA:  Leg pain EXAM: RIGHT KNEE - 1-2 VIEW COMPARISON:  None Available. FINDINGS: Postoperative changes. No acute fracture, subluxation or dislocation. No joint effusion. Soft tissues are unremarkable. IMPRESSION: No acute bony abnormality. Electronically Signed   By: Charlett Nose M.D.   On: 08/03/2022 20:03   DG Knee 1-2 Views Left  Result Date: 08/03/2022 CLINICAL DATA:  Leg pain EXAM: LEFT KNEE - 1-2 VIEW COMPARISON:  None Available. FINDINGS: Limited study due to  patient condition and positioning. No visible fracture, subluxation or dislocation. No joint effusion. IMPRESSION: No acute bony abnormality. Electronically Signed   By: Charlett Nose M.D.   On: 08/03/2022 20:02    Procedures Procedures    Medications Ordered in ED Medications  sodium chloride 0.9 % bolus 1,000 mL (has no administration in time range)  sodium chloride 0.9 % bolus 1,000 mL (0 mLs Intravenous Stopped 08/03/22 2209)  morphine (PF) 4 MG/ML injection 4 mg (4 mg Intravenous Given 08/03/22 2043)    ED Course/  Medical Decision Making/ A&P    56 year old history of lupus, MS here for evaluation of bilateral lower extremity weakness and pain.  No recent falls or injuries.  She denies any back pain.  Symptoms diffuse in nature to bilateral lower legs.  No ascending weakness.  No recent illnesses.  Also noted to have malodorous urine and dysuria.  She is concerned for UTI.  Has some intermittent incontinence at baseline.  Typically walks with a walker however has had to use a wheelchair.  Decreased strength to lower extremities however has some at baseline.  She is no overlying skin changes to her legs.  Compartments soft. No midline back pain.  Labs and imaging personally viewed and interpreted:  CBC without leukocytosis Metabolic panel creatinine 1.45, up from baseline CK 614 Mag 2.2 X-ray of bilateral knees without significant abnormality  CONSULT with Dr. Derry Lory with Neuro Rec MR brain, cervical, thoracic to R/O MS flare as cause of symptoms. Does not need lumbar MR. Possibly could have pseudoflare due to underlying illness?  Reassessed.  I discussed her labs and imaging thus far.  Has got IV fluids and pain medicine.  Still has difficulty with weakness and pain to lower extremities.  Pending urine  Nursing attempted to perform in and out catheter however unable to, needs room for better visualization due to patients inability to move. Patient will tell patient when she needs  to go to place on bedpan.  Care transferred to oncoming provider Karie Mainland, PA-C who will FU on remaining workup and determine disposition.                          Medical Decision Making Amount and/or Complexity of Data Reviewed External Data Reviewed: labs, radiology and notes. Labs: ordered. Decision-making details documented in ED Course. Radiology: ordered and independent interpretation performed. Decision-making details documented in ED Course.  Risk OTC drugs. Prescription drug management. Parenteral controlled substances. Decision regarding hospitalization. Diagnosis or treatment significantly limited by social determinants of health.         Final Clinical Impression(s) / ED Diagnoses Final diagnoses:  Weakness of both legs  Pain in both lower extremities  AKI (acute kidney injury) (HCC)  Elevated CK    Rx / DC Orders ED Discharge Orders     None         Lyndsie Wallman A, PA-C 08/03/22 2341    Rondel Baton, MD 08/15/22 [phone]

## 2022-08-03 NOTE — ED Provider Triage Note (Signed)
Emergency Medicine Provider Triage Evaluation Note  Dana Turner , a 56 y.o. female  was evaluated in triage.  Pt complains of bilateral leg pain from knee down. She normally is able to walk with a walker at home, but this week hasn't been able to move them at all. She was recently diagnosed with MS one year ago. She gets infusions every 6 months and last saw her neurologist 6 months ago.   Review of Systems  Positive:  Negative:   Physical Exam  BP (!) 148/92 (BP Location: Right Arm)   Pulse 98   Temp 98.1 F (36.7 C) (Oral)   Resp 16   SpO2 98%  Gen:   Awake, no distress   Resp:  Normal effort  MSK:   Moves extremities without difficulty  Other:    Medical Decision Making  Medically screening exam initiated at 12:43 PM.  Appropriate orders placed.  Dana Turner was informed that the remainder of the evaluation will be completed by another provider, this initial triage assessment does not replace that evaluation, and the importance of remaining in the ED until their evaluation is complete.     Claudie Leach, PA-C 08/03/22 1244

## 2022-08-03 NOTE — ED Notes (Signed)
Pt incontinent at baseline. Pt brief and bed wet. Full linen change provided, pt placed in brief

## 2022-08-04 ENCOUNTER — Encounter (HOSPITAL_COMMUNITY): Payer: Self-pay | Admitting: Internal Medicine

## 2022-08-04 ENCOUNTER — Other Ambulatory Visit: Payer: Self-pay

## 2022-08-04 ENCOUNTER — Inpatient Hospital Stay (HOSPITAL_COMMUNITY): Payer: Medicare HMO

## 2022-08-04 ENCOUNTER — Emergency Department (HOSPITAL_COMMUNITY): Payer: Medicare HMO

## 2022-08-04 DIAGNOSIS — E876 Hypokalemia: Secondary | ICD-10-CM | POA: Diagnosis present

## 2022-08-04 DIAGNOSIS — D509 Iron deficiency anemia, unspecified: Secondary | ICD-10-CM | POA: Diagnosis present

## 2022-08-04 DIAGNOSIS — M79605 Pain in left leg: Secondary | ICD-10-CM | POA: Diagnosis not present

## 2022-08-04 DIAGNOSIS — Z90710 Acquired absence of both cervix and uterus: Secondary | ICD-10-CM | POA: Diagnosis not present

## 2022-08-04 DIAGNOSIS — G35 Multiple sclerosis: Secondary | ICD-10-CM | POA: Diagnosis present

## 2022-08-04 DIAGNOSIS — M24552 Contracture, left hip: Secondary | ICD-10-CM | POA: Diagnosis present

## 2022-08-04 DIAGNOSIS — F17210 Nicotine dependence, cigarettes, uncomplicated: Secondary | ICD-10-CM | POA: Diagnosis present

## 2022-08-04 DIAGNOSIS — R29898 Other symptoms and signs involving the musculoskeletal system: Secondary | ICD-10-CM | POA: Diagnosis not present

## 2022-08-04 DIAGNOSIS — M6282 Rhabdomyolysis: Secondary | ICD-10-CM | POA: Diagnosis present

## 2022-08-04 DIAGNOSIS — B9620 Unspecified Escherichia coli [E. coli] as the cause of diseases classified elsewhere: Secondary | ICD-10-CM | POA: Diagnosis present

## 2022-08-04 DIAGNOSIS — M797 Fibromyalgia: Secondary | ICD-10-CM | POA: Diagnosis present

## 2022-08-04 DIAGNOSIS — R748 Abnormal levels of other serum enzymes: Secondary | ICD-10-CM | POA: Diagnosis present

## 2022-08-04 DIAGNOSIS — N179 Acute kidney failure, unspecified: Secondary | ICD-10-CM | POA: Diagnosis present

## 2022-08-04 DIAGNOSIS — I10 Essential (primary) hypertension: Secondary | ICD-10-CM | POA: Diagnosis present

## 2022-08-04 DIAGNOSIS — E871 Hypo-osmolality and hyponatremia: Secondary | ICD-10-CM | POA: Diagnosis present

## 2022-08-04 DIAGNOSIS — F32A Depression, unspecified: Secondary | ICD-10-CM | POA: Diagnosis present

## 2022-08-04 DIAGNOSIS — E872 Acidosis, unspecified: Secondary | ICD-10-CM | POA: Diagnosis present

## 2022-08-04 DIAGNOSIS — E860 Dehydration: Secondary | ICD-10-CM | POA: Diagnosis present

## 2022-08-04 DIAGNOSIS — E8809 Other disorders of plasma-protein metabolism, not elsewhere classified: Secondary | ICD-10-CM | POA: Diagnosis present

## 2022-08-04 DIAGNOSIS — H548 Legal blindness, as defined in USA: Secondary | ICD-10-CM | POA: Diagnosis present

## 2022-08-04 DIAGNOSIS — R531 Weakness: Secondary | ICD-10-CM | POA: Diagnosis present

## 2022-08-04 DIAGNOSIS — F419 Anxiety disorder, unspecified: Secondary | ICD-10-CM | POA: Diagnosis present

## 2022-08-04 DIAGNOSIS — K811 Chronic cholecystitis: Secondary | ICD-10-CM | POA: Diagnosis present

## 2022-08-04 DIAGNOSIS — N390 Urinary tract infection, site not specified: Secondary | ICD-10-CM | POA: Diagnosis present

## 2022-08-04 DIAGNOSIS — R7989 Other specified abnormal findings of blood chemistry: Secondary | ICD-10-CM | POA: Diagnosis not present

## 2022-08-04 DIAGNOSIS — R338 Other retention of urine: Secondary | ICD-10-CM | POA: Diagnosis not present

## 2022-08-04 DIAGNOSIS — K760 Fatty (change of) liver, not elsewhere classified: Secondary | ICD-10-CM | POA: Diagnosis present

## 2022-08-04 LAB — URINALYSIS, ROUTINE W REFLEX MICROSCOPIC
Bilirubin Urine: NEGATIVE
Glucose, UA: NEGATIVE mg/dL
Ketones, ur: 5 mg/dL — AB
Nitrite: NEGATIVE
Protein, ur: 100 mg/dL — AB
Specific Gravity, Urine: 1.018 (ref 1.005–1.030)
pH: 7 (ref 5.0–8.0)

## 2022-08-04 LAB — CBC
HCT: 39.1 % (ref 36.0–46.0)
Hemoglobin: 12.8 g/dL (ref 12.0–15.0)
MCH: 26.4 pg (ref 26.0–34.0)
MCHC: 32.7 g/dL (ref 30.0–36.0)
MCV: 80.8 fL (ref 80.0–100.0)
Platelets: 227 10*3/uL (ref 150–400)
RBC: 4.84 MIL/uL (ref 3.87–5.11)
RDW: 14.1 % (ref 11.5–15.5)
WBC: 7.2 10*3/uL (ref 4.0–10.5)
nRBC: 0 % (ref 0.0–0.2)

## 2022-08-04 LAB — CREATININE, SERUM
Creatinine, Ser: 1 mg/dL (ref 0.44–1.00)
GFR, Estimated: >60 mL/min (ref >60)

## 2022-08-04 LAB — HIV ANTIBODY (ROUTINE TESTING W REFLEX): HIV Screen 4th Generation wRfx: NONREACTIVE

## 2022-08-04 MED ORDER — ENOXAPARIN SODIUM 40 MG/0.4ML IJ SOSY
40.0000 mg | PREFILLED_SYRINGE | Freq: Every day | INTRAMUSCULAR | Status: DC
  Administered 2022-08-04 – 2022-08-16 (×13): 40 mg via SUBCUTANEOUS
  Filled 2022-08-04 (×13): qty 0.4

## 2022-08-04 MED ORDER — PROCHLORPERAZINE EDISYLATE 10 MG/2ML IJ SOLN: 5.0000 mg | Freq: Four times a day (QID) | INTRAMUSCULAR | Status: DC | PRN

## 2022-08-04 MED ORDER — OXYCODONE HCL 5 MG PO TABS
5.0000 mg | ORAL_TABLET | Freq: Four times a day (QID) | ORAL | Status: AC | PRN
  Administered 2022-08-04 – 2022-08-05 (×3): 5 mg via ORAL
  Filled 2022-08-04 (×3): qty 1

## 2022-08-04 MED ORDER — MELATONIN 5 MG PO TABS
5.0000 mg | ORAL_TABLET | Freq: Every evening | ORAL | Status: DC | PRN
  Administered 2022-08-05 – 2022-08-13 (×4): 5 mg via ORAL
  Filled 2022-08-04 (×4): qty 1

## 2022-08-04 MED ORDER — HYDRALAZINE HCL 25 MG PO TABS: 25.0000 mg | ORAL_TABLET | Freq: Two times a day (BID) | ORAL | Status: DC

## 2022-08-04 MED ORDER — POLYETHYLENE GLYCOL 3350 17 G PO PACK
17.0000 g | PACK | Freq: Every day | ORAL | Status: DC | PRN
  Administered 2022-08-16: 17 g via ORAL
  Filled 2022-08-04: qty 1

## 2022-08-04 MED ORDER — FERROUS SULFATE 325 (65 FE) MG PO TABS
325.0000 mg | ORAL_TABLET | Freq: Two times a day (BID) | ORAL | Status: DC
  Administered 2022-08-04 – 2022-08-16 (×26): 325 mg via ORAL
  Filled 2022-08-04 (×27): qty 1

## 2022-08-04 MED ORDER — HYDRALAZINE HCL 25 MG PO TABS
25.0000 mg | ORAL_TABLET | Freq: Three times a day (TID) | ORAL | Status: DC
  Administered 2022-08-04 – 2022-08-05 (×4): 25 mg via ORAL
  Filled 2022-08-04 (×4): qty 1

## 2022-08-04 MED ORDER — SODIUM BICARBONATE 650 MG PO TABS
650.0000 mg | ORAL_TABLET | Freq: Three times a day (TID) | ORAL | Status: AC
  Administered 2022-08-04 – 2022-08-05 (×6): 650 mg via ORAL
  Filled 2022-08-04 (×6): qty 1

## 2022-08-04 MED ORDER — ATORVASTATIN CALCIUM 10 MG PO TABS
20.0000 mg | ORAL_TABLET | Freq: Every day | ORAL | Status: DC
  Administered 2022-08-04 – 2022-08-05 (×2): 20 mg via ORAL
  Filled 2022-08-04 (×2): qty 2

## 2022-08-04 MED ORDER — ACETAMINOPHEN 325 MG PO TABS
650.0000 mg | ORAL_TABLET | Freq: Four times a day (QID) | ORAL | Status: DC | PRN
  Administered 2022-08-05 – 2022-08-16 (×19): 650 mg via ORAL
  Filled 2022-08-04 (×20): qty 2

## 2022-08-04 MED ORDER — SODIUM CHLORIDE 0.9 % IV SOLN: 1.0000 g | Freq: Once | INTRAVENOUS | Status: DC

## 2022-08-04 MED ORDER — HYDROMORPHONE HCL 1 MG/ML IJ SOLN
0.5000 mg | INTRAMUSCULAR | Status: AC | PRN
  Administered 2022-08-06 (×2): 0.5 mg via INTRAVENOUS
  Filled 2022-08-04 (×2): qty 0.5

## 2022-08-04 MED ORDER — GABAPENTIN 300 MG PO CAPS: 300.0000 mg | ORAL_CAPSULE | Freq: Three times a day (TID) | ORAL | Status: DC

## 2022-08-04 MED ORDER — LACTATED RINGERS IV SOLN: INTRAVENOUS | Status: AC

## 2022-08-04 MED ORDER — SODIUM CHLORIDE 0.9 % IV SOLN
2.0000 g | Freq: Every day | INTRAVENOUS | Status: DC
  Administered 2022-08-04 – 2022-08-06 (×3): 2 g via INTRAVENOUS
  Filled 2022-08-04 (×3): qty 20

## 2022-08-04 MED ORDER — SENNOSIDES-DOCUSATE SODIUM 8.6-50 MG PO TABS
1.0000 | ORAL_TABLET | Freq: Every day | ORAL | Status: AC
  Administered 2022-08-04 – 2022-08-06 (×3): 1 via ORAL
  Filled 2022-08-04 (×3): qty 1

## 2022-08-04 MED ORDER — GADOBUTROL 1 MMOL/ML IV SOLN
5.0000 mL | Freq: Once | INTRAVENOUS | Status: AC | PRN
  Administered 2022-08-04: 5 mL via INTRAVENOUS

## 2022-08-04 NOTE — Evaluation (Signed)
Physical Therapy Evaluation Patient Details Name: Dana Turner MRN: [id number] DOB: [birth year] Today's Date: 08/04/2022  History of Present Illness  Romona Murdy is a 56 y.o. female who presented to [facility] ED from home with complaints of unilateral left leg pain and bilateral leg weakness to the point that she mostly stayed in her wheelchair for a week.  No falls or trauma.  At baseline, the patient ambulates with a walker. PMH: fibromyalgia, urinary incontinence, MS (currently on q6 month IM Ocrevus), lupus, left eye blindness  Clinical Impression  Pt admitted with above diagnosis. Pt limited by low back pain and bil LE weakness and could not sit on edge of stretcher today. Pt may need SNF as she doesn't have 24 hour care.  Pt has been declining for up to 3 weeks per pt.  Will follow acutely and progress pt as able.  Pt currently with functional limitations due to the deficits listed below (see PT Problem List). Pt will benefit from skilled PT to increase their independence and safety with mobility to allow discharge to the venue listed below.          Recommendations for follow up therapy are one component of a multi-disciplinary discharge planning process, led by the attending physician.  Recommendations may be updated based on patient status, additional functional criteria and insurance authorization.  Follow Up Recommendations Skilled nursing-short term rehab (<3 hours/day) Can patient physically be transported by private vehicle: No    Assistance Recommended at Discharge Frequent or constant Supervision/Assistance  Patient can return home with the following  A lot of help with walking and/or transfers;A lot of help with bathing/dressing/bathroom;Assistance with cooking/housework;Assist for transportation;Help with stairs or ramp for entrance    Equipment Recommendations Other (comment) (TBA)  Recommendations for Other Services       Functional Status Assessment Patient has had a  recent decline in their functional status and demonstrates the ability to make significant improvements in function in a reasonable and predictable amount of time.     Precautions / Restrictions Precautions Precautions: Fall Restrictions Weight Bearing Restrictions: No      Mobility  Bed Mobility Overal bed mobility: Needs Assistance Bed Mobility: Rolling, Supine to Sit Rolling: Total assist   Supine to sit: Total assist     General bed mobility comments: Pt needed total assist to roll left with pt already on that side. Pt could not roll right due to pain.  Unable to sit EOB due to low back and bil LE pain per pt.    Transfers                   General transfer comment: unable    Ambulation/Gait                  Stairs            Wheelchair Mobility    Modified Rankin (Stroke Patients Only)       Balance                                             Pertinent Vitals/Pain Pain Assessment Pain Assessment: Faces Faces Pain Scale: Hurts even more Pain Location: low back Pain Descriptors / Indicators: Discomfort, Grimacing, Guarding Pain Intervention(s): Limited activity within patient's tolerance, Monitored during session, Repositioned    Home Living Family/patient expects to be discharged to:: Private residence Living  Arrangements: Other relatives (sister) Available Help at Discharge: Family;Available PRN/intermittently (sister works 8-5 M-F) Type of Home: House Home Access: Stairs to enter Entrance Stairs-Rails: Doctor, general practice of Steps: 4 Alternate Level Stairs-Number of Steps: flight Home Layout: Two level;Bed/bath upstairs Home Equipment: Rollator (4 wheels);Tub bench;Wheelchair - manual;BSC/3in1 Additional Comments: 2 walkers, one upstairs and one downstairs    Prior Function Prior Level of Function : Needs assist             Mobility Comments: Pt reports up until 2-3 weeeks ago, she could  walk with Modif I, she also states she could transfer to wheelchair with Modif I ADLs Comments: Sister assist with showering, pt also needed assist for dressing.  Could groom herself per pt     Hand Dominance        Extremity/Trunk Assessment   Upper Extremity Assessment Upper Extremity Assessment: Defer to OT evaluation    Lower Extremity Assessment Lower Extremity Assessment: RLE deficits/detail;LLE deficits/detail RLE Deficits / Details: Pt maintains flexion and was only able to extend with assist to lacking full extenstion by 10 degrees, very little active movement noted LLE Deficits / Details: Pt maintains flexed knee to 75 degrees and could not extend knee even with PT assist    Cervical / Trunk Assessment Cervical / Trunk Assessment: Kyphotic  Communication   Communication: No difficulties  Cognition Arousal/Alertness: Awake/alert Behavior During Therapy: WFL for tasks assessed/performed Overall Cognitive Status: Within Functional Limits for tasks assessed                                          General Comments General comments (skin integrity, edema, etc.): 105 bpm, 99% RA, 161/93    Exercises     Assessment/Plan    PT Assessment Patient needs continued PT services  PT Problem List Decreased activity tolerance;Decreased strength;Decreased range of motion;Decreased balance;Decreased mobility;Decreased knowledge of use of DME;Decreased safety awareness;Decreased knowledge of precautions;Pain       PT Treatment Interventions DME instruction;Gait training;Functional mobility training;Therapeutic activities;Therapeutic exercise;Balance training;Patient/family education    PT Goals (Current goals can be found in the Care Plan section)  Acute Rehab PT Goals Patient Stated Goal: to get better to go home PT Goal Formulation: With patient Time For Goal Achievement: 08/18/22 Potential to Achieve Goals: Good    Frequency Min 3X/week      Co-evaluation               AM-PAC PT "6 Clicks" Mobility  Outcome Measure Help needed turning from your back to your side while in a flat bed without using bedrails?: Total Help needed moving from lying on your back to sitting on the side of a flat bed without using bedrails?: Total Help needed moving to and from a bed to a chair (including a wheelchair)?: Total Help needed standing up from a chair using your arms (e.g., wheelchair or bedside chair)?: Total Help needed to walk in hospital room?: Total Help needed climbing 3-5 steps with a railing? : Total 6 Click Score: 6    End of Session   Activity Tolerance: Patient limited by fatigue;Patient limited by pain Patient left: with call bell/phone within reach (in stretcher) Nurse Communication: Mobility status PT Visit Diagnosis: Muscle weakness (generalized) (M62.81);Pain Pain - part of body:  (back)    Time: 2979-8921 PT Time Calculation (min) (ACUTE ONLY): 21 min   Charges:   PT  Evaluation $PT Eval Moderate Complexity: 1 Mod          Libi Corso M,PT Acute Rehab Services [phone]   Bevelyn Buckles 08/04/2022, 1:58 PM

## 2022-08-04 NOTE — Progress Notes (Signed)
Lower extremity venous bilateral study completed.  Preliminary results relayed to Kumar, MD.  See CV Proc for preliminary results report.   Kaivon Livesey, RDMS, RVT  

## 2022-08-04 NOTE — ED Provider Notes (Signed)
Signout received on this 56 year old female who presented for progressive worsening weakness.  She has been wheelchair-bound secondary to this weakness for the past week.  There was some dysuria over the past 3 days.  She is incontinent from a urinary standpoint at baseline.  She has history of lupus and MS.  Patient at the time of signout is awaiting MRIs.  Case was discussed with neurology who states if MRIs are positive to call neurology back otherwise no additional work-up from neurology standpoint. Physical Exam  BP (!) 151/86   Pulse 95   Temp 98.3 F (36.8 C) (Oral)   Resp (!) 21   SpO2 100%     Procedures  Procedures  ED Course / MDM    Medical Decision Making Amount and/or Complexity of Data Reviewed Labs: ordered. Radiology: ordered.  Risk Prescription drug management. Decision regarding hospitalization.   Nursing unable to obtain the UA sample due to patient's body habitus, and pain in the lower extremity.  Given the dysuria, and the weakness will empirically treat with Rocephin.  Will discuss with hospitalist for admission.  Her CK was elevated as well as a new AKI.  Fluids were given.  Patient discussed with hospitalist who will evaluate patient for admission.       Marita Kansas, PA-C 08/04/22 [id number]    Dione Booze, MD 08/04/22 0730

## 2022-08-04 NOTE — Care Plan (Signed)
This 56 years old female with PMH significant for fibromyalgia, urinary retention, MS(currently on q6 month IM Ocrevus), lupus, left eye blindness presented in the ED from home with complaints of unilateral left leg pain and bilateral leg weakness to the point where she mostly stayed in her wheelchair for a week.  She denies any fall or trauma.  At baseline she ambulates with a walker. She also reports having burning urination.  ED physician has discussed the case with neurology who recommended MRI brain,  cervical and thoracic spine to rule out MS flare as the cause of her symptoms.  She has developed acute urinary retention requiring in and out cath.  Patient was empirically started on Rocephin for presumptive UTI.  MRI is unremarkable for evidence of acute MS flare.  No evidence of demyelination.  Patient was seen and examined at bedside.  She reports doing better.

## 2022-08-04 NOTE — ED Notes (Signed)
ED TO INPATIENT HANDOFF REPORT  ED Nurse Name and Phone #: Zully Frane RN [phone]  S Name/Age/Gender Dana Turner 56 y.o. female Room/Bed: 013C/013C  Code Status   Code Status: Full Code  Home/SNF/Other Home Patient oriented to: self, place, time, and situation Is this baseline? Yes   Triage Complete: Triage complete  Chief Complaint Weakness of both legs [R29.898]  Triage Note Pt arrives via EMS from home with leg pain for a week. Hx of MS. Pt states she isnt able to get around like she normally does. Also endorses painful urination.    Allergies No Known Allergies  Level of Care/Admitting Diagnosis ED Disposition     ED Disposition  Admit   Condition  --   Comment  Hospital Area: [facility] [[id number]]  Level of Care: Telemetry Medical [[id number]]  May admit patient to [facility] or [facility] if equivalent level of care is available:: No  Covid Evaluation: Asymptomatic - no recent exposure (last 10 days) testing not required  Diagnosis: Weakness of both legs [phone]  Admitting Physician: Darlin Drop [[id number]]  Attending Physician: Darlin Drop [[id number]]  Certification:: I certify this patient will need inpatient services for at least 2 midnights  Estimated Length of Stay: 2          B Medical/Surgery History Past Medical History:  Diagnosis Date   Anxiety    Depression    Hypertension    Legally blind in left eye, as defined in [place]    Lupus [facility])    Past Surgical History:  Procedure Laterality Date   ABDOMINAL HYSTERECTOMY  1996   EYE SURGERY Left 2016     A IV Location/Drains/Wounds Patient Lines/Drains/Airways Status     Active Line/Drains/Airways     Name Placement date Placement time Site Days   Peripheral IV 08/03/22 20 G Left Forearm 08/03/22  2039  Forearm  1            Intake/Output Last 24 hours  Intake/Output Summary (Last 24 hours) at 08/04/2022 1527 Last data filed at 08/03/2022 2209 Gross per 24 hour   Intake 1000 ml  Output --  Net 1000 ml    Labs/Imaging Results for orders placed or performed during the hospital encounter of 08/03/22 (from the past 48 hour(s))  Comprehensive metabolic panel     Status: Abnormal   Collection Time: 08/03/22  1:33 PM  Result Value Ref Range   Sodium 136 135 - 145 mmol/L   Potassium 3.8 3.5 - 5.1 mmol/L   Chloride 108 98 - 111 mmol/L   CO2 17 (L) 22 - 32 mmol/L   Glucose, Bld 118 (H) 70 - 99 mg/dL    Comment: Glucose reference range applies only to samples taken after fasting for at least 8 hours.   BUN 55 (H) 6 - 20 mg/dL   Creatinine, Ser 9.16 (H) 0.44 - 1.00 mg/dL   Calcium 9.3 8.9 - 94.5 mg/dL   Total Protein 7.1 6.5 - 8.1 g/dL   Albumin 3.2 (L) 3.5 - 5.0 g/dL   AST 44 (H) 15 - 41 U/L   ALT 27 0 - 44 U/L   Alkaline Phosphatase 64 38 - 126 U/L   Total Bilirubin 0.7 0.3 - 1.2 mg/dL   GFR, Estimated 43 (L) >60 mL/min    Comment: (NOTE) Calculated using the CKD-EPI Creatinine Equation (2021)    Anion gap 11 5 - 15    Comment: Performed at [facility], [address] [place], [place] [address]  CBC with Differential     Status: Abnormal   Collection Time: 08/03/22  1:33 PM  Result Value Ref Range   WBC 8.1 4.0 - 10.5 K/uL   RBC 5.24 (H) 3.87 - 5.11 MIL/uL   Hemoglobin 13.4 12.0 - 15.0 g/dL   HCT 47.8 29.5 - 62.1 %   MCV 80.9 80.0 - 100.0 fL   MCH 25.6 (L) 26.0 - 34.0 pg   MCHC 31.6 30.0 - 36.0 g/dL   RDW 30.8 65.7 - 84.6 %   Platelets 272 150 - 400 K/uL   nRBC 0.0 0.0 - 0.2 %   Neutrophils Relative % 71 %   Neutro Abs 5.8 1.7 - 7.7 K/uL   Lymphocytes Relative 17 %   Lymphs Abs 1.4 0.7 - 4.0 K/uL   Monocytes Relative 11 %   Monocytes Absolute 0.9 0.1 - 1.0 K/uL   Eosinophils Relative 1 %   Eosinophils Absolute 0.0 0.0 - 0.5 K/uL   Basophils Relative 0 %   Basophils Absolute 0.0 0.0 - 0.1 K/uL   Immature Granulocytes 0 %   Abs Immature Granulocytes 0.01 0.00 - 0.07 K/uL    Comment: Performed at [facility],  [address], [place], [place] [address]  Magnesium     Status: None   Collection Time: 08/03/22  1:33 PM  Result Value Ref Range   Magnesium 2.2 1.7 - 2.4 mg/dL    Comment: Performed at [facility], [address], [place], [place] [address]  CK     Status: Abnormal   Collection Time: 08/03/22  1:33 PM  Result Value Ref Range   Total CK 614 (H) 38 - 234 U/L    Comment: Performed at [facility], [address], [place], [place] [address]  Urinalysis, Routine w reflex microscopic Urine, In & Out Cath     Status: Abnormal   Collection Time: 08/04/22  4:54 AM  Result Value Ref Range   Color, Urine YELLOW YELLOW   APPearance TURBID (A) CLEAR   Specific Gravity, Urine 1.018 1.005 - 1.030   pH 7.0 5.0 - 8.0   Glucose, UA NEGATIVE NEGATIVE mg/dL   Hgb urine dipstick MODERATE (A) NEGATIVE   Bilirubin Urine NEGATIVE NEGATIVE   Ketones, ur 5 (A) NEGATIVE mg/dL   Protein, ur 027 (A) NEGATIVE mg/dL   Nitrite NEGATIVE NEGATIVE   Leukocytes,Ua SMALL (A) NEGATIVE   RBC / HPF 21-50 0 - 5 RBC/hpf   WBC, UA 0-5 0 - 5 WBC/hpf   Bacteria, UA MANY (A) NONE SEEN   Mucus PRESENT     Comment: Performed at [facility], [address], [place], [place] [address]  HIV Antibody (routine testing w rflx)     Status: None   Collection Time: 08/04/22  5:55 AM  Result Value Ref Range   HIV Screen 4th Generation wRfx Non Reactive Non Reactive    Comment: Performed at [facility], [address] [place], [place] [address]  CBC     Status: None   Collection Time: 08/04/22  5:55 AM  Result Value Ref Range   WBC 7.2 4.0 - 10.5 K/uL   RBC 4.84 3.87 - 5.11 MIL/uL   Hemoglobin 12.8 12.0 - 15.0 g/dL   HCT 74.2 59.5 - 63.8 %   MCV 80.8 80.0 - 100.0 fL   MCH 26.4 26.0 - 34.0 pg   MCHC 32.7 30.0 - 36.0  g/dL   RDW 11.9 14.7 - 82.9 %   Platelets 227 150 - 400 K/uL   nRBC 0.0 0.0 - 0.2 %    Comment: Performed at [facility], [address], [place], [place] [address]  Creatinine,  serum     Status: None   Collection Time: 08/04/22  5:55 AM  Result Value Ref Range   Creatinine, Ser 1.00 0.44 - 1.00 mg/dL   GFR, Estimated >08 >65 mL/min    Comment: (NOTE) Calculated using the CKD-EPI Creatinine Equation (2021) Performed at [facility], [address], [place], [place] [address]    MR THORACIC SPINE W WO CONTRAST  Result Date: 08/04/2022 CLINICAL DATA:  Initial evaluation for demyelinating disease. EXAM: MRI THORACIC WITHOUT AND WITH CONTRAST TECHNIQUE: Multiplanar and multiecho pulse sequences of the thoracic spine were obtained without and with intravenous contrast. CONTRAST:  57mL GADAVIST GADOBUTROL 1 MMOL/ML IV SOLN COMPARISON:  Comparison made with prior MRI from 03/11/2021. FINDINGS: Alignment:  Examination degraded by motion artifact. Vertebral bodies normally aligned with preservation of the normal thoracic kyphosis. No listhesis. Vertebrae: Vertebral body height maintained without acute or chronic fracture. Bone marrow signal intensity within normal limits. No worrisome osseous lesions. No abnormal marrow edema or enhancement. Cord: Diffuse patchy signal abnormality again seen throughout the thoracic spinal cord, consistent with history of demyelinating disease. Involvement is fairly extensive with signal changes seen throughout the majority of the cord. In comparison with prior study, overall appearance is likely not significantly changed or progressed. No definite new lesions. No abnormal enhancement to suggest active demyelination. Paraspinal and other soft tissues: No acute finding. 2.1 cm simple left renal cyst, benign in appearance, no follow-up imaging recommended. Disc levels: Minimal for age disc degeneration within the midthoracic spine. No significant facet disease. No significant canal or foraminal stenosis. IMPRESSION: Diffuse patchy signal abnormality throughout the thoracic spinal cord, consistent with history of chronic demyelinating disease. Overall,  appearance is not significantly changed or progressed as compared to prior study from 03/11/2021. No evidence for active demyelination. Electronically Signed   By: Rise Mu M.D.   On: 08/04/2022 02:13   MR Cervical Spine W or Wo Contrast  Result Date: 08/04/2022 CLINICAL DATA:  Initial evaluation for demyelinating disease. EXAM: MRI CERVICAL SPINE WITHOUT AND WITH CONTRAST TECHNIQUE: Multiplanar and multiecho pulse sequences of the cervical spine, to include the craniocervical junction and cervicothoracic junction, were obtained without and with intravenous contrast. CONTRAST:  79mL GADAVIST GADOBUTROL 1 MMOL/ML IV SOLN COMPARISON:  Prior MRI from 05/20/2021. FINDINGS: Alignment: Examination is degraded by motion artifact, most notably at the postcontrast sagittal sequence. Straightening with reversal of the normal cervical lordosis. No listhesis. Vertebrae: Vertebral body height maintained without acute or chronic fracture. Bone marrow signal intensity within normal limits. No discrete or worrisome osseous lesions. Mild discogenic reactive endplate changes present about the C4-5 and C5-6 interspaces. No other abnormal marrow edema. Cord: Again seen is extensive patchy signal abnormality throughout the cervical spinal cord, consistent with history of demyelinating disease/multiple sclerosis. Changes extend from the cervicomedullary junction through the visualized upper thoracic spinal cord, with involvement of essentially all levels. In comparison with previous exam, overall appearance is likely non significantly changed or progressed. No definite new lesions. No visible abnormal enhancement to suggest active demyelination. Posterior Fossa, vertebral arteries, paraspinal tissues: Unremarkable. Disc levels: C2-C3: Negative interspace. Mild left-sided facet hypertrophy. No canal or foraminal stenosis. C3-C4: Mild disc bulge with right-sided uncovertebral and facet hypertrophy. No significant  spinal  stenosis. Foramina remain adequately patent. C4-C5: Right eccentric disc bulge. Associated right greater than left uncovertebral spurring. Flattening of the ventral thecal sac with no more than mild spinal stenosis. Moderate right C5 foraminal narrowing. Appearance is similar to prior. C5-C6: Small central disc protrusion indents the ventral thecal sac. No significant spinal stenosis or frank cord impingement. Mild uncovertebral spurring without significant foraminal stenosis. Appearance is stable. C6-C7: Small central disc protrusion mildly indents the ventral thecal sac. No significant spinal stenosis or cord impingement. Right-sided uncovertebral spurring without significant foraminal encroachment. Appearance is relatively similar. C7-T1:  Unremarkable. IMPRESSION: 1. Extensive patchy signal abnormality throughout the cervical spinal cord, consistent with history of chronic demyelinating disease/multiple sclerosis. Overall, appearance is not significantly changed or progressed as compared to previous MRI from 05/20/2021. No definite new lesions or evidence for active demyelination. 2. Mild multilevel cervical spondylosis, most pronounced at C4-5 where there is resultant mild spinal stenosis, with moderate right C5 foraminal narrowing. Electronically Signed   By: Rise Mu M.D.   On: 08/04/2022 02:04   MR Brain W and Wo Contrast  Result Date: 08/04/2022 CLINICAL DATA:  Initial evaluation for demyelinating disease. EXAM: MRI HEAD WITHOUT AND WITH CONTRAST TECHNIQUE: Multiplanar, multiecho pulse sequences of the brain and surrounding structures were obtained without and with intravenous contrast. CONTRAST:  40mL GADAVIST GADOBUTROL 1 MMOL/ML IV SOLN COMPARISON:  Prior MRI from 05/20/2020. FINDINGS: Brain: Examination mildly degraded by motion artifact. Advanced cerebral atrophy for age. Again seen is extensive white matter changes consistent with history of demyelinating disease. There is involvement  of both the supratentorial and infratentorial white matter, with involvement of the brainstem and cerebellum. Multiple corresponding T1 black holes. In comparison with previous exam, overall extent and pattern is not significantly changed or progressed. No associated enhancement or restricted diffusion to suggest active demyelination. No evidence for acute or subacute infarct. No mass lesion, midline shift or mass effect. No hydrocephalus or extra-axial fluid collection. No evidence for acute intracranial hemorrhage. Single punctate chronic microhemorrhage noted within the right thalamus. Pituitary gland and suprasellar region within normal limits. Vascular: Major intracranial vascular flow voids are maintained. Skull and upper cervical spine: Craniocervical junction within normal limits. Bone marrow signal intensity normal. No scalp soft tissue abnormality. Sinuses/Orbits: Globes orbital soft tissues demonstrate no acute finding. Mild scattered mucosal thickening noted about the ethmoidal air cells and maxillary sinuses. No mastoid effusion. Other: None. IMPRESSION: 1. Extensive cerebral white matter disease, consistent with history of chronic demyelinating disease/multiple sclerosis. Overall, appearance is not significantly changed or progressed as compared to most recent MRI from 05/20/2021. No evidence for active demyelination. 2. Underlying advanced cerebral atrophy for age. 3. No other acute intracranial abnormality. Electronically Signed   By: Rise Mu M.D.   On: 08/04/2022 01:55   DG Knee 1-2 Views Right  Result Date: 08/03/2022 CLINICAL DATA:  Leg pain EXAM: RIGHT KNEE - 1-2 VIEW COMPARISON:  None Available. FINDINGS: Postoperative changes. No acute fracture, subluxation or dislocation. No joint effusion. Soft tissues are unremarkable. IMPRESSION: No acute bony abnormality. Electronically Signed   By: Charlett Nose M.D.   On: 08/03/2022 20:03   DG Knee 1-2 Views Left  Result Date:  08/03/2022 CLINICAL DATA:  Leg pain EXAM: LEFT KNEE - 1-2 VIEW COMPARISON:  None Available. FINDINGS: Limited study due to patient condition and positioning. No visible fracture, subluxation or dislocation. No joint effusion. IMPRESSION: No acute bony abnormality. Electronically Signed   By: Charlett Nose M.D.  On: 08/03/2022 20:02    Pending Labs Unresulted Labs (From admission, onward)     Start     Ordered   08/11/22 0500  Creatinine, serum  (enoxaparin (LOVENOX)    CrCl >/= 30 ml/min)  Weekly,   R     Comments: while on enoxaparin therapy    08/04/22 0455   08/05/22 0500  Comprehensive metabolic panel  Tomorrow morning,   R        08/04/22 0626   08/05/22 0500  CK  Tomorrow morning,   R        08/04/22 0626   08/03/22 1848  Urine Culture  Once,   URGENT       Question:  Indication  Answer:  Urgency/frequency   08/03/22 1847            Vitals/Pain Today's Vitals   08/04/22 1313 08/04/22 1444 08/04/22 1500 08/04/22 1502  BP:  (!) 163/105 (!) 180/55 (!) 180/55  Pulse: 99   95  Resp: (!) 22  (!) 24 20  Temp: 98.3 F (36.8 C)     TempSrc: Oral     SpO2: 97%   95%  PainSc:        Isolation Precautions No active isolations  Medications Medications  cefTRIAXone (ROCEPHIN) 2 g in sodium chloride 0.9 % 100 mL IVPB (0 g Intravenous Stopped 08/04/22 0648)  enoxaparin (LOVENOX) injection 40 mg (40 mg Subcutaneous Given 08/04/22 0908)  atorvastatin (LIPITOR) tablet 20 mg (20 mg Oral Given 08/04/22 0905)  ferrous sulfate tablet 325 mg (325 mg Oral Given 08/04/22 0905)  lactated ringers infusion ( Intravenous Rate/Dose Verify 08/04/22 1504)  acetaminophen (TYLENOL) tablet 650 mg (has no administration in time range)  polyethylene glycol (MIRALAX / GLYCOLAX) packet 17 g (has no administration in time range)  prochlorperazine (COMPAZINE) injection 5 mg (has no administration in time range)  melatonin tablet 5 mg (has no administration in time range)  oxyCODONE (Oxy IR/ROXICODONE)  immediate release tablet 5 mg (has no administration in time range)  HYDROmorphone (DILAUDID) injection 0.5 mg (has no administration in time range)  senna-docusate (Senokot-S) tablet 1 tablet (has no administration in time range)  hydrALAZINE (APRESOLINE) tablet 25 mg (25 mg Oral Given 08/04/22 1444)  sodium bicarbonate tablet 650 mg (650 mg Oral Given 08/04/22 0905)  sodium chloride 0.9 % bolus 1,000 mL (0 mLs Intravenous Stopped 08/03/22 2209)  morphine (PF) 4 MG/ML injection 4 mg (4 mg Intravenous Given 08/03/22 2043)  sodium chloride 0.9 % bolus 1,000 mL (0 mLs Intravenous Stopped 08/04/22 0454)  gadobutrol (GADAVIST) 1 MMOL/ML injection 5 mL (5 mLs Intravenous Contrast Given 08/04/22 0120)    Mobility non-ambulatory Moderate fall risk   Focused Assessments Cardiac Assessment Handoff:    Lab Results  Component Value Date   CKTOTAL 614 (H) 08/03/2022   Lab Results  Component Value Date   DDIMER <0.27 05/23/2021   Does the Patient currently have chest pain? No   , Pulmonary Assessment Handoff:  Lung sounds:   O2 Device: Room Air      R Recommendations: See Admitting Provider Note  Report given to:   Additional Notes:

## 2022-08-04 NOTE — ED Notes (Signed)
Dr. Hall at bedside.

## 2022-08-04 NOTE — ED Notes (Signed)
RN readjusted pt in bed  °

## 2022-08-04 NOTE — ED Notes (Signed)
Pt cleaned up, repositioned in bed, water given per request

## 2022-08-04 NOTE — ED Notes (Signed)
Pt placed on bed pan - unable to urinate at this time  Taken to MRI

## 2022-08-04 NOTE — H&P (Addendum)
History and Physical  Dana Turner GNF:[id number] DOB: 03-20-1966 DOA: 08/03/2022  Referring physician: Marita Kansas, PA-EDP  PCP: Georgina Quint, MD  Outpatient Specialists: Neurology Patient coming from: Home, lives with her sister.  Chief Complaint: Legs pain, weakness.  HPI: Dana Turner is a 56 y.o. female with medical history significant for fibromyalgia, urinary incontinence, MS (currently on q6 month IM Ocrevus), lupus, left eye blindness who presented to [facility] ED from home with complaints of unilateral left leg pain and bilateral leg weakness to the point that she mostly stayed in her wheelchair for a week.  No falls or trauma.  At baseline, the patient ambulates with a walker.    Also endorses dysuria for the past 3 days.  No subjective fever, chills, abdominal pain, or nausea.  EMS was activated and the patient was brought into the ED for further evaluation.  EDP discussed the case with neurology who recommended MRI brain, cervical and thoracic spine to rule out MS flare as a cause of her symptoms.  Imaging completed, no evidence for active demyelination.  While in the ED, she developed acute urinary retention.  In-N-Out cath with 600 cc of urine output.  Due to dysuria she was started on Rocephin empirically for presumptive UTI.  UA and urine culture are pending at the time of this dictation.  EDP requested admission.  The patient was admitted by [facility], hospitalist service.  ED Course: Tmax 98.3.  BP 150/95, pulse 94, respiratory 17, O2 saturation 97% on room air.  Studies remarkable for serum bicarb 17, anion gap 11, creatinine 1.45 from baseline of 0.7, BUN 55, GFR 43, AST 44.  CPK 614  Review of Systems: Review of systems as noted in the HPI. All other systems reviewed and are negative.   Past Medical History:  Diagnosis Date   Anxiety    Depression    Hypertension    Legally blind in left eye, as defined in [place]    Lupus [facility])    Past Surgical History:  Procedure  Laterality Date   ABDOMINAL HYSTERECTOMY  1996   EYE SURGERY Left 2016    Social History:  reports that she has been smoking cigarettes. She has a 5.00 pack-year smoking history. She has never used smokeless tobacco. She reports current alcohol use of about 1.0 standard drink of alcohol per week. She reports that she does not use drugs.   No Known Allergies  Family history: None reported  Prior to Admission medications   Medication Sig Start Date End Date Taking? Authorizing Provider  amLODipine (NORVASC) 10 MG tablet Take 1 tablet (10 mg total) by mouth daily. 09/14/18   Georgina Quint, MD  atorvastatin (LIPITOR) 20 MG tablet Take 20 mg by mouth daily. 03/27/21   [provider]  dalfampridine (AMPYRA) 10 MG TB12 Take 1 tablet (10 mg total) by mouth every 12 (twelve) hours. 07/20/21   Everlena Cooper, Adam R, DO  FEROSUL 325 (65 Fe) MG tablet Take 325 mg by mouth 2 (two) times daily. 01/05/21   [provider]  gabapentin (NEURONTIN) 300 MG capsule Take 1 capsule (300 mg total) by mouth 3 (three) times daily. 07/27/21   Drema Dallas, DO  hydrALAZINE (APRESOLINE) 25 MG tablet Take 25 mg by mouth 2 (two) times daily. 04/02/21   [provider]  hydroxychloroquine (PLAQUENIL) 200 MG tablet Take 200 mg by mouth 2 (two) times daily.    [provider]  lisinopril (ZESTRIL) 20 MG tablet Take 1 tablet (20  mg total) by mouth daily. 05/24/21 06/23/21  Dorcas Carrow, MD    Physical Exam: BP 121/72 (BP Location: Left Arm)   Pulse 88   Temp 97.7 F (36.5 C) (Oral)   Resp 17   SpO2 97%   General: 56 y.o. year-old female Frail appearing in no acute distress.  Alert and oriented x3. Cardiovascular: Regular rate and rhythm with no rubs or gallops.  No thyromegaly or JVD noted.  No lower extremity edema. 2/4 pulses in all 4 extremities. Respiratory: Clear to auscultation with no wheezes or rales. Good inspiratory effort. Abdomen: Soft nontender nondistended with normal  bowel sounds x4 quadrants. Muskuloskeletal: Left calf tender with palpation. Neuro: CN II-XII intact, strength, sensation, reflexes Skin: No ulcerative lesions noted or rashes Psychiatry: Judgement and insight appear normal. Mood is appropriate for condition and setting          Labs on Admission:  Basic Metabolic Panel: Recent Labs  Lab 08/03/22 1333  NA 136  K 3.8  CL 108  CO2 17*  GLUCOSE 118*  BUN 55*  CREATININE 1.45*  CALCIUM 9.3  MG 2.2   Liver Function Tests: Recent Labs  Lab 08/03/22 1333  AST 44*  ALT 27  ALKPHOS 64  BILITOT 0.7  PROT 7.1  ALBUMIN 3.2*   No results for input(s): "LIPASE", "AMYLASE" in the last 168 hours. No results for input(s): "AMMONIA" in the last 168 hours. CBC: Recent Labs  Lab 08/03/22 1333  WBC 8.1  NEUTROABS 5.8  HGB 13.4  HCT 42.4  MCV 80.9  PLT 272   Cardiac Enzymes: Recent Labs  Lab 08/03/22 1333  CKTOTAL 614*    BNP (last 3 results) No results for input(s): "BNP" in the last 8760 hours.  ProBNP (last 3 results) No results for input(s): "PROBNP" in the last 8760 hours.  CBG: No results for input(s): "GLUCAP" in the last 168 hours.  Radiological Exams on Admission: MR THORACIC SPINE W WO CONTRAST  Result Date: 08/04/2022 CLINICAL DATA:  Initial evaluation for demyelinating disease. EXAM: MRI THORACIC WITHOUT AND WITH CONTRAST TECHNIQUE: Multiplanar and multiecho pulse sequences of the thoracic spine were obtained without and with intravenous contrast. CONTRAST:  76mL GADAVIST GADOBUTROL 1 MMOL/ML IV SOLN COMPARISON:  Comparison made with prior MRI from 03/11/2021. FINDINGS: Alignment:  Examination degraded by motion artifact. Vertebral bodies normally aligned with preservation of the normal thoracic kyphosis. No listhesis. Vertebrae: Vertebral body height maintained without acute or chronic fracture. Bone marrow signal intensity within normal limits. No worrisome osseous lesions. No abnormal marrow edema or  enhancement. Cord: Diffuse patchy signal abnormality again seen throughout the thoracic spinal cord, consistent with history of demyelinating disease. Involvement is fairly extensive with signal changes seen throughout the majority of the cord. In comparison with prior study, overall appearance is likely not significantly changed or progressed. No definite new lesions. No abnormal enhancement to suggest active demyelination. Paraspinal and other soft tissues: No acute finding. 2.1 cm simple left renal cyst, benign in appearance, no follow-up imaging recommended. Disc levels: Minimal for age disc degeneration within the midthoracic spine. No significant facet disease. No significant canal or foraminal stenosis. IMPRESSION: Diffuse patchy signal abnormality throughout the thoracic spinal cord, consistent with history of chronic demyelinating disease. Overall, appearance is not significantly changed or progressed as compared to prior study from 03/11/2021. No evidence for active demyelination. Electronically Signed   By: Rise Mu M.D.   On: 08/04/2022 02:13   MR Cervical Spine W or Wo Contrast  Result Date: 08/04/2022 CLINICAL DATA:  Initial evaluation for demyelinating disease. EXAM: MRI CERVICAL SPINE WITHOUT AND WITH CONTRAST TECHNIQUE: Multiplanar and multiecho pulse sequences of the cervical spine, to include the craniocervical junction and cervicothoracic junction, were obtained without and with intravenous contrast. CONTRAST:  47mL GADAVIST GADOBUTROL 1 MMOL/ML IV SOLN COMPARISON:  Prior MRI from 05/20/2021. FINDINGS: Alignment: Examination is degraded by motion artifact, most notably at the postcontrast sagittal sequence. Straightening with reversal of the normal cervical lordosis. No listhesis. Vertebrae: Vertebral body height maintained without acute or chronic fracture. Bone marrow signal intensity within normal limits. No discrete or worrisome osseous lesions. Mild discogenic reactive  endplate changes present about the C4-5 and C5-6 interspaces. No other abnormal marrow edema. Cord: Again seen is extensive patchy signal abnormality throughout the cervical spinal cord, consistent with history of demyelinating disease/multiple sclerosis. Changes extend from the cervicomedullary junction through the visualized upper thoracic spinal cord, with involvement of essentially all levels. In comparison with previous exam, overall appearance is likely non significantly changed or progressed. No definite new lesions. No visible abnormal enhancement to suggest active demyelination. Posterior Fossa, vertebral arteries, paraspinal tissues: Unremarkable. Disc levels: C2-C3: Negative interspace. Mild left-sided facet hypertrophy. No canal or foraminal stenosis. C3-C4: Mild disc bulge with right-sided uncovertebral and facet hypertrophy. No significant spinal stenosis. Foramina remain adequately patent. C4-C5: Right eccentric disc bulge. Associated right greater than left uncovertebral spurring. Flattening of the ventral thecal sac with no more than mild spinal stenosis. Moderate right C5 foraminal narrowing. Appearance is similar to prior. C5-C6: Small central disc protrusion indents the ventral thecal sac. No significant spinal stenosis or frank cord impingement. Mild uncovertebral spurring without significant foraminal stenosis. Appearance is stable. C6-C7: Small central disc protrusion mildly indents the ventral thecal sac. No significant spinal stenosis or cord impingement. Right-sided uncovertebral spurring without significant foraminal encroachment. Appearance is relatively similar. C7-T1:  Unremarkable. IMPRESSION: 1. Extensive patchy signal abnormality throughout the cervical spinal cord, consistent with history of chronic demyelinating disease/multiple sclerosis. Overall, appearance is not significantly changed or progressed as compared to previous MRI from 05/20/2021. No definite new lesions or evidence  for active demyelination. 2. Mild multilevel cervical spondylosis, most pronounced at C4-5 where there is resultant mild spinal stenosis, with moderate right C5 foraminal narrowing. Electronically Signed   By: Rise Mu M.D.   On: 08/04/2022 02:04   MR Brain W and Wo Contrast  Result Date: 08/04/2022 CLINICAL DATA:  Initial evaluation for demyelinating disease. EXAM: MRI HEAD WITHOUT AND WITH CONTRAST TECHNIQUE: Multiplanar, multiecho pulse sequences of the brain and surrounding structures were obtained without and with intravenous contrast. CONTRAST:  26mL GADAVIST GADOBUTROL 1 MMOL/ML IV SOLN COMPARISON:  Prior MRI from 05/20/2020. FINDINGS: Brain: Examination mildly degraded by motion artifact. Advanced cerebral atrophy for age. Again seen is extensive white matter changes consistent with history of demyelinating disease. There is involvement of both the supratentorial and infratentorial white matter, with involvement of the brainstem and cerebellum. Multiple corresponding T1 black holes. In comparison with previous exam, overall extent and pattern is not significantly changed or progressed. No associated enhancement or restricted diffusion to suggest active demyelination. No evidence for acute or subacute infarct. No mass lesion, midline shift or mass effect. No hydrocephalus or extra-axial fluid collection. No evidence for acute intracranial hemorrhage. Single punctate chronic microhemorrhage noted within the right thalamus. Pituitary gland and suprasellar region within normal limits. Vascular: Major intracranial vascular flow voids are maintained. Skull and upper cervical spine: Craniocervical junction within normal limits.  Bone marrow signal intensity normal. No scalp soft tissue abnormality. Sinuses/Orbits: Globes orbital soft tissues demonstrate no acute finding. Mild scattered mucosal thickening noted about the ethmoidal air cells and maxillary sinuses. No mastoid effusion. Other: None.  IMPRESSION: 1. Extensive cerebral white matter disease, consistent with history of chronic demyelinating disease/multiple sclerosis. Overall, appearance is not significantly changed or progressed as compared to most recent MRI from 05/20/2021. No evidence for active demyelination. 2. Underlying advanced cerebral atrophy for age. 3. No other acute intracranial abnormality. Electronically Signed   By: Rise Mu M.D.   On: 08/04/2022 01:55   DG Knee 1-2 Views Right  Result Date: 08/03/2022 CLINICAL DATA:  Leg pain EXAM: RIGHT KNEE - 1-2 VIEW COMPARISON:  None Available. FINDINGS: Postoperative changes. No acute fracture, subluxation or dislocation. No joint effusion. Soft tissues are unremarkable. IMPRESSION: No acute bony abnormality. Electronically Signed   By: Charlett Nose M.D.   On: 08/03/2022 20:03   DG Knee 1-2 Views Left  Result Date: 08/03/2022 CLINICAL DATA:  Leg pain EXAM: LEFT KNEE - 1-2 VIEW COMPARISON:  None Available. FINDINGS: Limited study due to patient condition and positioning. No visible fracture, subluxation or dislocation. No joint effusion. IMPRESSION: No acute bony abnormality. Electronically Signed   By: Charlett Nose M.D.   On: 08/03/2022 20:02    EKG: I independently viewed the EKG done and my findings are as followed: None available at the time of this visit.  Assessment/Plan Present on Admission:  Weakness of both legs  Principal Problem:   Weakness of both legs  Bilateral lower extremity weakness and Left lower extremity unilateral pain Unclear etiology MRI brain cervical and thoracic spine unremarkable for evidence of acute MS flare.  No evidence of demyelination. Resume home gabapentin Pain control and bowel regimen PT OT assessment Fall precautions  Presumptive UTI, POA Dysuria x 3 days Follow UA and urine culture Continue Rocephin until UTI is ruled out   AKI, likely prerenal in the setting of dehydration from poor intake. Baseline creatinine  0.74 with GFR greater than 60 Presented with creatinine 1.45 with GFR 43.  Avoid nephrotoxic agents, dehydration and hypotension Gentle IV fluid hydration LR 50 cc/h x 2 days.  Non anion gap metabolic acidosis in the setting of acute renal insufficiency Serum bicarb 17, anion gap 11 Gentle IV fluid hydration Repeat chemistry panel in the morning  Isolated elevated AST Suspect from alcohol use Monitor for now  Elevated CPK CPK on presentation 614 IV fluid hydration Repeat CPK level on 08/05/2022.    DVT prophylaxis: Subcu Lovenox daily  Code Status: Full code  Family Communication: None at bedside  Disposition Plan: Admitted to telemetry medical unit  Consults called: None  Admission status: Inpatient status.   Status is: Inpatient The patient requires at least 2 midnights for further evaluation and treatment of present condition.   Darlin Drop MD [facility] Pager [phone]  If 7PM-7AM, please contact night-coverage [email or web address] Password [facility]  08/04/2022, 4:57 AM

## 2022-08-05 DIAGNOSIS — R29898 Other symptoms and signs involving the musculoskeletal system: Secondary | ICD-10-CM | POA: Diagnosis not present

## 2022-08-05 LAB — COMPREHENSIVE METABOLIC PANEL
ALT: 36 U/L (ref 0–44)
AST: 83 U/L — ABNORMAL HIGH (ref 15–41)
Albumin: 2.7 g/dL — ABNORMAL LOW (ref 3.5–5.0)
Alkaline Phosphatase: 48 U/L (ref 38–126)
Anion gap: 7 (ref 5–15)
BUN: 26 mg/dL — ABNORMAL HIGH (ref 6–20)
CO2: 20 mmol/L — ABNORMAL LOW (ref 22–32)
Calcium: 9.1 mg/dL (ref 8.9–10.3)
Chloride: 110 mmol/L (ref 98–111)
Creatinine, Ser: 0.94 mg/dL (ref 0.44–1.00)
GFR, Estimated: >60 mL/min (ref >60)
Glucose, Bld: 115 mg/dL — ABNORMAL HIGH (ref 70–99)
Potassium: 3.8 mmol/L (ref 3.5–5.1)
Sodium: 137 mmol/L (ref 135–145)
Total Bilirubin: 0.6 mg/dL (ref 0.3–1.2)
Total Protein: 6.1 g/dL — ABNORMAL LOW (ref 6.5–8.1)

## 2022-08-05 LAB — CBC
HCT: 37 % (ref 36.0–46.0)
Hemoglobin: 11.9 g/dL — ABNORMAL LOW (ref 12.0–15.0)
MCH: 25.5 pg — ABNORMAL LOW (ref 26.0–34.0)
MCHC: 32.2 g/dL (ref 30.0–36.0)
MCV: 79.4 fL — ABNORMAL LOW (ref 80.0–100.0)
Platelets: 245 10*3/uL (ref 150–400)
RBC: 4.66 MIL/uL (ref 3.87–5.11)
RDW: 13.9 % (ref 11.5–15.5)
WBC: 6.4 10*3/uL (ref 4.0–10.5)
nRBC: 0 % (ref 0.0–0.2)

## 2022-08-05 LAB — MAGNESIUM: Magnesium: 1.8 mg/dL (ref 1.7–2.4)

## 2022-08-05 LAB — CK: Total CK: 2701 U/L — ABNORMAL HIGH (ref 38–234)

## 2022-08-05 LAB — PHOSPHORUS: Phosphorus: 2.4 mg/dL — ABNORMAL LOW (ref 2.5–4.6)

## 2022-08-05 MED ORDER — HYDRALAZINE HCL 50 MG PO TABS
50.0000 mg | ORAL_TABLET | Freq: Three times a day (TID) | ORAL | Status: DC
  Administered 2022-08-05 – 2022-08-16 (×35): 50 mg via ORAL
  Filled 2022-08-05 (×34): qty 1

## 2022-08-05 NOTE — Progress Notes (Signed)
PROGRESS NOTE    Dana Turner  ZOX:[id number] DOB: 1966/04/07 DOA: 08/03/2022  PCP: Georgina Quint, MD   Brief Narrative:  This 56 years old female with PMH significant for fibromyalgia, urinary retention, MS(currently on q6 month IM Ocrevus), lupus, left eye blindness presented in the ED from home with complaints of unilateral left leg pain and bilateral leg weakness to the point where she mostly stayed in her wheelchair for a week.  She denies any fall or trauma.  At baseline she ambulates with a walker. She also reports having burning urination.  ED physician has discussed the case with neurology who recommended MRI brain,  cervical and thoracic spine to rule out MS flare as the cause of her symptoms.  She has developed acute urinary retention requiring in and out cath.  Patient was empirically started on Rocephin for presumptive UTI.  MRI is unremarkable for evidence of acute MS flare.  No evidence of demyelination.She reports doing better.   Assessment & Plan:   Principal Problem:   Weakness of both legs   B/L LE weakness and Left LE unilateral pain: Etiology unclear.  Could be due to UTI. MRI brain cervical and thoracic spine unremarkable for evidence of acute MS flare.  No evidence of demyelination. Continue gabapentin. B/L LE  venous duplex negative for DVT. Continue adequate pain control and bowel regimen. PT and OT recommended skilled nursing facility. Fall precautions.  E. coli UTI POA: Patient reports burning urination for the last 3 days. Urine culture grew E. coli sensitivity pending. Continue ceftriaxone.   Acute kidney injury: Likely prerenal in the setting of dehydration from poor p.o. intake Baseline creatinine 0.74, presented with creatinine 1.45. Avoid nephrotoxic medications.   Renal functions improved with IV hydration.   Anion gap metabolic acidosis in setting of AKI Serum bicarb 17, anion gap 11 Improved with IV hydration.   Isolated elevated  AST: Suspect from alcohol use. Monitor for now   Elevated CPK CPK 614> 2701. Patient denies any trauma or fall. Continue IV hydration.  Hold Lipitor. Check CPK level in am   Hypophosphatemia: Replaced.  Continue to monitor   DVT prophylaxis: Lovenox Code Status: Full code Family Communication: No family at bed side Disposition Plan:   Status is: Inpatient Remains inpatient appropriate because: Admitted for bilateral lower extremity weakness, MRI without acute demyelination.  UA consistent with UTI,  cultures growing E. coli , continued on IV antibiotics.  PT OT recommended SNF.    Consultants:  None  Procedures: NONE  Antimicrobials: Ceftriaxone  Subjective: Patient was seen and examined at bedside.  Overnight events noted. Patient reports feeling slightly better, still reports having burning while urination.  Objective: Vitals:   08/04/22 2325 08/05/22 0250 08/05/22 0348 08/05/22 0811  BP: (!) 137/114  (!) 165/97 (!) 131/91  Pulse: 84  96 84  Resp: Temp: 99.1 F (37.3 C)  97.9 F (36.6 C) 98.4 F (36.9 C)  TempSrc: Oral  Oral Oral  SpO2: 97%  99% 100%  Weight:  49.8 kg    Height:   (1.6 m)      Intake/Output Summary (Last 24 hours) at 08/05/2022 1059 Last data filed at 08/05/2022 0300 Gross per 24 hour  Intake 1133.34 ml  Output 700 ml  Net 433.34 ml   Filed Weights   08/05/22 0250  Weight: 49.8 kg    Examination:  General exam: Appears comfortable, not in any acute distress, deconditioned. Respiratory system: CTA bilaterally, respiratory  effort normal, RR 15 Cardiovascular system: S1 & S2 heard, regular rate and rhythm, no murmur. Gastrointestinal system: Abdomen is nontender, nondistended, BS+ Central nervous system: Alert and oriented X 3. No focal neurological deficits. Extremities: No edema, no cyanosis, no clubbing Skin: No rashes, lesions or ulcers Psychiatry: Judgement and insight appear normal. Mood & affect appropriate.      Data Reviewed: I have personally reviewed following labs and imaging studies  CBC: Recent Labs  Lab 08/03/22 1333 08/04/22 0555 08/05/22 0527  WBC 8.1 7.2 6.4  NEUTROABS 5.8  --   --   HGB 13.4 12.8 11.9*  HCT 42.4 39.1 37.0  MCV 80.9 80.8 79.4*  PLT 272 227 245   Basic Metabolic Panel: Recent Labs  Lab 08/03/22 1333 08/04/22 0555 08/05/22 0527  NA 136  --  137  K 3.8  --  3.8  CL 108  --  110  CO2 17*  --  20*  GLUCOSE 118*  --  115*  BUN 55*  --  26*  CREATININE 1.45* 1.00 0.94  CALCIUM 9.3  --  9.1  MG 2.2  --  1.8  PHOS  --   --  2.4*   GFR: Estimated Creatinine Clearance: 53.2 mL/min (by C-G formula based on SCr of 0.94 mg/dL). Liver Function Tests: Recent Labs  Lab 08/03/22 1333 08/05/22 0527  AST 44* 83*  ALT 27 36  ALKPHOS 64 48  BILITOT 0.7 0.6  PROT 7.1 6.1*  ALBUMIN 3.2* 2.7*   No results for input(s): "LIPASE", "AMYLASE" in the last 168 hours. No results for input(s): "AMMONIA" in the last 168 hours. Coagulation Profile: No results for input(s): "INR", "PROTIME" in the last 168 hours. Cardiac Enzymes: Recent Labs  Lab 08/03/22 1333 08/05/22 0527  CKTOTAL 614* 2,701*   BNP (last 3 results) No results for input(s): "PROBNP" in the last 8760 hours. HbA1C: No results for input(s): "HGBA1C" in the last 72 hours. CBG: No results for input(s): "GLUCAP" in the last 168 hours. Lipid Profile: No results for input(s): "CHOL", "HDL", "LDLCALC", "TRIG", "CHOLHDL", "LDLDIRECT" in the last 72 hours. Thyroid Function Tests: No results for input(s): "TSH", "T4TOTAL", "FREET4", "T3FREE", "THYROIDAB" in the last 72 hours. Anemia Panel: No results for input(s): "VITAMINB12", "FOLATE", "FERRITIN", "TIBC", "IRON", "RETICCTPCT" in the last 72 hours. Sepsis Labs: No results for input(s): "PROCALCITON", "LATICACIDVEN" in the last 168 hours.  Recent Results (from the past 240 hour(s))  Urine Culture     Status: Abnormal (Preliminary result)    Collection Time: 08/04/22  4:53 AM   Specimen: Urine, Clean Catch  Result Value Ref Range Status   Specimen Description URINE, CLEAN CATCH  Final   Special Requests NONE  Final   Culture (A)  Final    >=100,000 COLONIES/mL ESCHERICHIA COLI CULTURE REINCUBATED FOR BETTER GROWTH SUSCEPTIBILITIES TO FOLLOW Performed at [facility], [address], [place], [place] [address]    Report Status PENDING  Incomplete    Radiology Studies: VAS [place] LOWER EXTREMITY VENOUS (DVT)  Result Date: 08/04/2022  Lower Venous DVT Study Patient Name:  DORISSA STINNETTE  Date of Exam:   08/04/2022 Medical Rec #: [id number]       Accession #:    [id number] Date of Birth: 1965/09/08       Patient Gender: F Patient Age:   36 years Exam Location:  [facility] Procedure:      VAS [place] LOWER EXTREMITY VENOUS (DVT) Referring Phys: Dow Adolph --------------------------------------------------------------------------------  Indications: Bilateral  lower extremity weakness, left leg pain.  Risk Factors: Immobility. Limitations: Patient positioning- significant pain with attempts to readjust. Comparison Study: No prior studies. Performing Technologist: Jean Rosenthal RDMS, RVT  Examination Guidelines: A complete evaluation includes B-mode imaging, spectral Doppler, color Doppler, and power Doppler as needed of all accessible portions of each vessel. Bilateral testing is considered an integral part of a complete examination. Limited examinations for reoccurring indications may be performed as noted. The reflux portion of the exam is performed with the patient in reverse Trendelenburg.  +---------+---------------+---------+-----------+----------+---------------+ RIGHT    CompressibilityPhasicitySpontaneityPropertiesThrombus Aging  +---------+---------------+---------+-----------+----------+---------------+ CFV      Full           Yes      Yes                                   +---------+---------------+---------+-----------+----------+---------------+ SFJ      Full                                                         +---------+---------------+---------+-----------+----------+---------------+ FV Prox  Full                                                         +---------+---------------+---------+-----------+----------+---------------+ FV Mid   Full                                                         +---------+---------------+---------+-----------+----------+---------------+ FV DistalFull                                                         +---------+---------------+---------+-----------+----------+---------------+ PFV      Full                                                         +---------+---------------+---------+-----------+----------+---------------+ POP      Full           Yes      Yes                                  +---------+---------------+---------+-----------+----------+---------------+ PTV                     Yes      Yes                  Patent by color +---------+---------------+---------+-----------+----------+---------------+ PERO                    Yes  Yes                  Patent by color +---------+---------------+---------+-----------+----------+---------------+   +---------+---------------+---------+-----------+----------+--------------+ LEFT     CompressibilityPhasicitySpontaneityPropertiesThrombus Aging +---------+---------------+---------+-----------+----------+--------------+ CFV      Full           Yes      Yes                                 +---------+---------------+---------+-----------+----------+--------------+ SFJ      Full                                                        +---------+---------------+---------+-----------+----------+--------------+ FV Prox  Full                                                         +---------+---------------+---------+-----------+----------+--------------+ FV Mid   Full                                                        +---------+---------------+---------+-----------+----------+--------------+ FV DistalFull                                                        +---------+---------------+---------+-----------+----------+--------------+ PFV      Full                                                        +---------+---------------+---------+-----------+----------+--------------+ POP      Full           Yes      Yes                                 +---------+---------------+---------+-----------+----------+--------------+ PTV      Full                                                        +---------+---------------+---------+-----------+----------+--------------+ PERO     Full                                                        +---------+---------------+---------+-----------+----------+--------------+     Summary: RIGHT: - There is no evidence of deep vein thrombosis in the lower extremity. However, portions of  this examination were limited- see technologist comments above.  - No cystic structure found in the popliteal fossa.  LEFT: - There is no evidence of deep vein thrombosis in the lower extremity.  - No cystic structure found in the popliteal fossa.  *See table(s) above for measurements and observations. Electronically signed by Coral Else MD on 08/04/2022 at 8:53:49 PM.    Final    MR THORACIC SPINE W WO CONTRAST  Result Date: 08/04/2022 CLINICAL DATA:  Initial evaluation for demyelinating disease. EXAM: MRI THORACIC WITHOUT AND WITH CONTRAST TECHNIQUE: Multiplanar and multiecho pulse sequences of the thoracic spine were obtained without and with intravenous contrast. CONTRAST:  14mL GADAVIST GADOBUTROL 1 MMOL/ML IV SOLN COMPARISON:  Comparison made with prior MRI from 03/11/2021. FINDINGS: Alignment:  Examination degraded by motion  artifact. Vertebral bodies normally aligned with preservation of the normal thoracic kyphosis. No listhesis. Vertebrae: Vertebral body height maintained without acute or chronic fracture. Bone marrow signal intensity within normal limits. No worrisome osseous lesions. No abnormal marrow edema or enhancement. Cord: Diffuse patchy signal abnormality again seen throughout the thoracic spinal cord, consistent with history of demyelinating disease. Involvement is fairly extensive with signal changes seen throughout the majority of the cord. In comparison with prior study, overall appearance is likely not significantly changed or progressed. No definite new lesions. No abnormal enhancement to suggest active demyelination. Paraspinal and other soft tissues: No acute finding. 2.1 cm simple left renal cyst, benign in appearance, no follow-up imaging recommended. Disc levels: Minimal for age disc degeneration within the midthoracic spine. No significant facet disease. No significant canal or foraminal stenosis. IMPRESSION: Diffuse patchy signal abnormality throughout the thoracic spinal cord, consistent with history of chronic demyelinating disease. Overall, appearance is not significantly changed or progressed as compared to prior study from 03/11/2021. No evidence for active demyelination. Electronically Signed   By: Rise Mu M.D.   On: 08/04/2022 02:13   MR Cervical Spine W or Wo Contrast  Result Date: 08/04/2022 CLINICAL DATA:  Initial evaluation for demyelinating disease. EXAM: MRI CERVICAL SPINE WITHOUT AND WITH CONTRAST TECHNIQUE: Multiplanar and multiecho pulse sequences of the cervical spine, to include the craniocervical junction and cervicothoracic junction, were obtained without and with intravenous contrast. CONTRAST:  85mL GADAVIST GADOBUTROL 1 MMOL/ML IV SOLN COMPARISON:  Prior MRI from 05/20/2021. FINDINGS: Alignment: Examination is degraded by motion artifact, most notably at the postcontrast  sagittal sequence. Straightening with reversal of the normal cervical lordosis. No listhesis. Vertebrae: Vertebral body height maintained without acute or chronic fracture. Bone marrow signal intensity within normal limits. No discrete or worrisome osseous lesions. Mild discogenic reactive endplate changes present about the C4-5 and C5-6 interspaces. No other abnormal marrow edema. Cord: Again seen is extensive patchy signal abnormality throughout the cervical spinal cord, consistent with history of demyelinating disease/multiple sclerosis. Changes extend from the cervicomedullary junction through the visualized upper thoracic spinal cord, with involvement of essentially all levels. In comparison with previous exam, overall appearance is likely non significantly changed or progressed. No definite new lesions. No visible abnormal enhancement to suggest active demyelination. Posterior Fossa, vertebral arteries, paraspinal tissues: Unremarkable. Disc levels: C2-C3: Negative interspace. Mild left-sided facet hypertrophy. No canal or foraminal stenosis. C3-C4: Mild disc bulge with right-sided uncovertebral and facet hypertrophy. No significant spinal stenosis. Foramina remain adequately patent. C4-C5: Right eccentric disc bulge. Associated right greater than left uncovertebral spurring. Flattening of the ventral thecal sac with no more than mild spinal stenosis. Moderate right C5 foraminal narrowing. Appearance is similar  to prior. C5-C6: Small central disc protrusion indents the ventral thecal sac. No significant spinal stenosis or frank cord impingement. Mild uncovertebral spurring without significant foraminal stenosis. Appearance is stable. C6-C7: Small central disc protrusion mildly indents the ventral thecal sac. No significant spinal stenosis or cord impingement. Right-sided uncovertebral spurring without significant foraminal encroachment. Appearance is relatively similar. C7-T1:  Unremarkable. IMPRESSION: 1.  Extensive patchy signal abnormality throughout the cervical spinal cord, consistent with history of chronic demyelinating disease/multiple sclerosis. Overall, appearance is not significantly changed or progressed as compared to previous MRI from 05/20/2021. No definite new lesions or evidence for active demyelination. 2. Mild multilevel cervical spondylosis, most pronounced at C4-5 where there is resultant mild spinal stenosis, with moderate right C5 foraminal narrowing. Electronically Signed   By: Rise Mu M.D.   On: 08/04/2022 02:04   MR Brain W and Wo Contrast  Result Date: 08/04/2022 CLINICAL DATA:  Initial evaluation for demyelinating disease. EXAM: MRI HEAD WITHOUT AND WITH CONTRAST TECHNIQUE: Multiplanar, multiecho pulse sequences of the brain and surrounding structures were obtained without and with intravenous contrast. CONTRAST:  5mL GADAVIST GADOBUTROL 1 MMOL/ML IV SOLN COMPARISON:  Prior MRI from 05/20/2020. FINDINGS: Brain: Examination mildly degraded by motion artifact. Advanced cerebral atrophy for age. Again seen is extensive white matter changes consistent with history of demyelinating disease. There is involvement of both the supratentorial and infratentorial white matter, with involvement of the brainstem and cerebellum. Multiple corresponding T1 black holes. In comparison with previous exam, overall extent and pattern is not significantly changed or progressed. No associated enhancement or restricted diffusion to suggest active demyelination. No evidence for acute or subacute infarct. No mass lesion, midline shift or mass effect. No hydrocephalus or extra-axial fluid collection. No evidence for acute intracranial hemorrhage. Single punctate chronic microhemorrhage noted within the right thalamus. Pituitary gland and suprasellar region within normal limits. Vascular: Major intracranial vascular flow voids are maintained. Skull and upper cervical spine: Craniocervical junction  within normal limits. Bone marrow signal intensity normal. No scalp soft tissue abnormality. Sinuses/Orbits: Globes orbital soft tissues demonstrate no acute finding. Mild scattered mucosal thickening noted about the ethmoidal air cells and maxillary sinuses. No mastoid effusion. Other: None. IMPRESSION: 1. Extensive cerebral white matter disease, consistent with history of chronic demyelinating disease/multiple sclerosis. Overall, appearance is not significantly changed or progressed as compared to most recent MRI from 05/20/2021. No evidence for active demyelination. 2. Underlying advanced cerebral atrophy for age. 3. No other acute intracranial abnormality. Electronically Signed   By: Rise Mu M.D.   On: 08/04/2022 01:55   DG Knee 1-2 Views Right  Result Date: 08/03/2022 CLINICAL DATA:  Leg pain EXAM: RIGHT KNEE - 1-2 VIEW COMPARISON:  None Available. FINDINGS: Postoperative changes. No acute fracture, subluxation or dislocation. No joint effusion. Soft tissues are unremarkable. IMPRESSION: No acute bony abnormality. Electronically Signed   By: Charlett Nose M.D.   On: 08/03/2022 20:03   DG Knee 1-2 Views Left  Result Date: 08/03/2022 CLINICAL DATA:  Leg pain EXAM: LEFT KNEE - 1-2 VIEW COMPARISON:  None Available. FINDINGS: Limited study due to patient condition and positioning. No visible fracture, subluxation or dislocation. No joint effusion. IMPRESSION: No acute bony abnormality. Electronically Signed   By: Charlett Nose M.D.   On: 08/03/2022 20:02     Scheduled Meds:  enoxaparin (LOVENOX) injection  40 mg Subcutaneous Daily   ferrous sulfate  325 mg Oral BID WC   hydrALAZINE  50 mg Oral Q8H   senna-docusate  1 tablet Oral QHS   sodium bicarbonate  650 mg Oral TID   Continuous Infusions:  cefTRIAXone (ROCEPHIN)  IV 2 g (08/05/22 0853)   lactated ringers 50 mL/hr at 08/04/22 2050     LOS: 1 day    Time spent: 50 mins    Tyrann Donaho, MD [facility]   If  7PM-7AM, please contact night-coverage

## 2022-08-05 NOTE — TOC Initial Note (Addendum)
Transition of Care [facility]) - Initial/Assessment Note    Patient Details  Name: Dana Turner MRN: [id number] Date of Birth: 17-Feb-1966  [facility]) CM/SW Contact:    Tory Emerald, LCSW Phone Number: 08/05/2022, 11:12 AM  Clinical Narrative:                 Pt comes from home, living with sister- Steward Drone. Steward Drone inquired about Medicaid reinstatement. CSW provided the number to [facility] DSS. CSW explained the process and will follow up when needed. Work up complete and fax out sent.   Expected Discharge Plan: Skilled Nursing Facility Barriers to Discharge: Continued Medical Work up   Patient Goals and CMS Choice   CMS Medicare.gov Compare Post Acute Care list provided to:: Patient (and pt's sister, Steward Drone) Choice offered to / list presented to : Sibling, Patient  Expected Discharge Plan and Services Expected Discharge Plan: Skilled Nursing Facility                                              Prior Living Arrangements/Services     Patient language and need for interpreter reviewed:: Yes Do you feel safe going back to the place where you live?: Yes      Need for Family Participation in Patient Care: Yes (Comment) Care giver support system in place?: Yes (comment)   Criminal Activity/Legal Involvement Pertinent to Current Situation/Hospitalization: No - Comment as needed  Activities of Daily Living Home Assistive Devices/Equipment: Wheelchair, Environmental consultant (specify type) ADL Screening (condition at time of admission) Patient's cognitive ability adequate to safely complete daily activities?: Yes Is the patient deaf or have difficulty hearing?: No Does the patient have difficulty seeing, even when wearing glasses/contacts?: Yes Does the patient have difficulty concentrating, remembering, or making decisions?: Yes Patient able to express need for assistance with ADLs?: Yes Does the patient have difficulty dressing or bathing?: Yes Independently performs  ADLs?: No Communication: Independent Dressing (OT): Needs assistance Is this a change from baseline?: Pre-admission baseline Grooming: Needs assistance Is this a change from baseline?: Pre-admission baseline Feeding: Independent Bathing: Needs assistance Is this a change from baseline?: Pre-admission baseline Toileting: Needs assistance Is this a change from baseline?: Pre-admission baseline In/Out Bed: Needs assistance Is this a change from baseline?: Pre-admission baseline Walks in Home: Dependent Is this a change from baseline?: Pre-admission baseline Does the patient have difficulty walking or climbing stairs?: Yes Weakness of Legs: Both Weakness of Arms/Hands: Both  Permission Sought/Granted Permission sought to share information with : Family Supports Permission granted to share information with : Yes, Verbal Permission Granted  Share Information with NAME: Steward Drone     Permission granted to share info w Relationship: Sister and Youth worker granted to share info w Contact Information: Steward Drone  Emotional Assessment              Admission diagnosis:  Elevated CK [R74.8] Weakness of both legs [R29.898] AKI (acute kidney injury) (HCC) [N17.9] Pain in both lower extremities [U20.254, M79.605] Patient Active Problem List   Diagnosis Date Noted   Weakness of both legs 08/04/2022   COVID-19 05/21/2021   Weakness 05/20/2021   Hypertensive urgency 05/20/2021   Febrile illness 05/20/2021   Multiple sclerosis (HCC) 04/23/2021   Essential hypertension 09/14/2018   History of lupus 09/14/2018   Chronic pain of right knee 09/14/2018   History of tear of ACL (  anterior cruciate ligament) 09/14/2018   PCP:  Georgina Quint, MD Pharmacy:   [facility] [place], [place] - [address] AT [facility] [address] [place] [address] Phone: [phone] Fax: [phone]  [id number] [place], [place] - [address] AT [facility] OF [address] [facility] [address] [place] [address] Phone: [phone] Fax: [phone]     Social Determinants of Health (SDOH) Interventions    Readmission Risk Interventions     No data to display

## 2022-08-05 NOTE — [place] FL2 (Signed)
[place] MEDICAID FL2 LEVEL OF CARE FORM     IDENTIFICATION  Patient Name: Dana Turner Birthdate: 21-Mar-1966 Sex: female Admission Date (Current Location): 08/03/2022  [facility] and [place] Number:  Producer, television/film/video and Address:  [facility] [place], [address] [place], [place] [address]      Provider Number: [id number]  Attending Physician Name and Address:  Cipriano Bunker, MD  Relative Name and Phone Number:  Suszanne Conners [phone]    Current Level of Care: Hospital Recommended Level of Care: Skilled Nursing Facility Prior Approval Number:    Date Approved/Denied:   PASRR Number: [id number]  Discharge Plan: SNF    Current Diagnoses: Patient Active Problem List   Diagnosis Date Noted   Weakness of both legs 08/04/2022   COVID-19 05/21/2021   Weakness 05/20/2021   Hypertensive urgency 05/20/2021   Febrile illness 05/20/2021   Multiple sclerosis (HCC) 04/23/2021   Essential hypertension 09/14/2018   History of lupus 09/14/2018   Chronic pain of right knee 09/14/2018   History of tear of ACL (anterior cruciate ligament) 09/14/2018    Orientation RESPIRATION BLADDER Height & Weight     Self  Normal Incontinent, External catheter Weight: 109 lb 12.6 oz (49.8 kg) Height:  5\' 3"  (160 cm)  BEHAVIORAL SYMPTOMS/MOOD NEUROLOGICAL BOWEL NUTRITION STATUS      Continent Diet (see d/c summary)  AMBULATORY STATUS COMMUNICATION OF NEEDS Skin   Extensive Assist   Normal                       Personal Care Assistance Level of Assistance  Bathing, Feeding, Dressing Bathing Assistance: Maximum assistance Feeding assistance: Limited assistance Dressing Assistance: Maximum assistance     Functional Limitations Info  Sight, Hearing, Speech Sight Info: Adequate Hearing Info: Adequate Speech Info: Adequate    SPECIAL CARE FACTORS FREQUENCY  PT (By licensed PT), OT (By licensed OT)     PT Frequency: 5x/week OT Frequency: 5x.week             Contractures Contractures Info: Not present    Additional Factors Info  Code Status Code Status Info: Full             Current Medications (08/05/2022):  This is the current hospital active medication list Current Facility-Administered Medications  Medication Dose Route Frequency Provider Last Rate Last Admin   acetaminophen (TYLENOL) tablet 650 mg  650 mg Oral Q6H PRN 14/02/2022 N, DO       cefTRIAXone (ROCEPHIN) 2 g in sodium chloride 0.9 % 100 mL IVPB  2 g Intravenous Daily Dow Adolph N, DO 200 mL/hr at 08/05/22 0853 2 g at 08/05/22 0853   enoxaparin (LOVENOX) injection 40 mg  40 mg Subcutaneous Daily 14/07/23 N, DO   40 mg at 08/05/22 0853   ferrous sulfate tablet 325 mg  325 mg Oral BID WC 14/07/23 N, DO   325 mg at 08/05/22 14/07/23   hydrALAZINE (APRESOLINE) tablet 50 mg  50 mg Oral Q8H 8469, MD       HYDROmorphone (DILAUDID) injection 0.5 mg  0.5 mg Intravenous Q4H PRN Cipriano Bunker N, DO       lactated ringers infusion   Intravenous Continuous Dow Adolph, DO 50 mL/hr at 08/04/22 2050 New Bag at 08/04/22 2050   melatonin tablet 5 mg  5 mg Oral QHS PRN 14/06/23 N, DO       oxyCODONE (Oxy IR/ROXICODONE) immediate release tablet  5 mg  5 mg Oral Q6H PRN Irene Pap N, DO   5 mg at 08/05/22 0647   polyethylene glycol (MIRALAX / GLYCOLAX) packet 17 g  17 g Oral Daily PRN Irene Pap N, DO       prochlorperazine (COMPAZINE) injection 5 mg  5 mg Intravenous Q6H PRN [place] Crane, Carole N, DO       senna-docusate (Senokot-S) tablet 1 tablet  1 tablet Oral QHS Irene Pap N, DO   1 tablet at 08/04/22 2038   sodium bicarbonate tablet 650 mg  650 mg Oral TID Shawna Clamp, MD   650 mg at 08/05/22 [id number]     Discharge Medications: Please see discharge summary for a list of discharge medications.  Relevant Imaging Results:  Relevant Lab Results:   Additional Information SS#: [id number]  [name], LCSW

## 2022-08-05 NOTE — Evaluation (Signed)
Occupational Therapy Evaluation Patient Details Name: Dana Turner MRN: [id number] DOB: April 05, 1966 Today's Date: 08/05/2022   History of Present Illness Dana Turner is a 56 y.o. female who presented to [facility] ED from home with complaints of unilateral left leg pain and bilateral leg weakness to the point that she mostly stayed in her wheelchair for a week.  No falls or trauma.  At baseline, the patient ambulates with a walker. PMH: fibromyalgia, urinary incontinence, MS (currently on q6 month IM Ocrevus), lupus, left eye blindness   Clinical Impression   PTA, pt lived with sister who recently was helping with transfers due to BLE weakness. Upon eval, LLE remains in flexed position at the hip and knee. Very limited movement of R or LLE; able to achieve EOB with some relaxation of LLE musculature with OT facilitating support at trunk, and light pressure to achieve 90 degrees at hip and ~80 degrees at knee; +2 to support sitting balance during stretch. Pt with good tolerance, benefiting from cues to engage in deep breathing. Performing UB ADL with up to max A due to poor sitting balance at this time, and LB ADL with total A due to muscle tightness and pain. Recommending SNF for continued OT services.      Recommendations for follow up therapy are one component of a multi-disciplinary discharge planning process, led by the attending physician.  Recommendations may be updated based on patient status, additional functional criteria and insurance authorization.   Follow Up Recommendations  Skilled nursing-short term rehab (<3 hours/day)     Assistance Recommended at Discharge Intermittent Supervision/Assistance  Patient can return home with the following Two people to help with walking and/or transfers;Two people to help with bathing/dressing/bathroom;Assistance with cooking/housework;Assist for transportation;Help with stairs or ramp for entrance    Functional Status Assessment  Patient has had a  recent decline in their functional status and demonstrates the ability to make significant improvements in function in a reasonable and predictable amount of time.  Equipment Recommendations  None recommended by OT    Recommendations for Other Services       Precautions / Restrictions Precautions Precautions: Fall Restrictions Weight Bearing Restrictions: No      Mobility Bed Mobility Overal bed mobility: Needs Assistance Bed Mobility: Rolling, Supine to Sit, Sit to Supine Rolling: +2 for physical assistance, +2 for safety/equipment, Max assist   Supine to sit: Total assist, +2 for physical assistance, +2 for safety/equipment Sit to supine: Total assist, +2 for physical assistance, +2 for safety/equipment   General bed mobility comments: Rolling initally with min A, but up to max A +2 to achieve full sidelying due to tightness in BLE, hips/core    Transfers Overall transfer level: Needs assistance   Transfers: Bed to chair/wheelchair/BSC            Lateral/Scoot Transfers: Total assist, +2 physical assistance, +2 safety/equipment General transfer comment: unable; Lateral scoot toward [facility] with total A+2      Balance Overall balance assessment: Needs assistance Sitting-balance support: Bilateral upper extremity supported Sitting balance-Leahy Scale: Poor Sitting balance - Comments: Up to max A at times  to maintain sitting balance                                   ADL either performed or assessed with clinical judgement   ADL Overall ADL's : Needs assistance/impaired Eating/Feeding: Bed level;Set up   Grooming: Set up;Bed level  Upper Body Bathing: Minimal assistance;Bed level   Lower Body Bathing: Total assistance;Bed level   Upper Body Dressing : Maximal assistance;Sitting   Lower Body Dressing: Total assistance;Bed level     Toilet Transfer Details (indicate cue type and reason): deferred due to inability to fully extend          Functional mobility during ADLs: Total assistance General ADL Comments: Pt limited by BLE decreased active movement, LLE flexed and RLE in extension. Very little active movement     Vision Patient Visual Report: No change from baseline Vision Assessment?: Vision impaired- to be further tested in functional context Additional Comments: visual deficits at baseline; pt intermittently requiring cues to locate items on L     Perception Perception Perception Tested?: No   Praxis Praxis Praxis tested?: Not tested    Pertinent Vitals/Pain Pain Assessment Pain Assessment: Faces Faces Pain Scale: Hurts even more Pain Location: BLE Pain Descriptors / Indicators: Discomfort, Grimacing, Guarding Pain Intervention(s): Limited activity within patient's tolerance, Monitored during session, Repositioned, Relaxation     Hand Dominance Right   Extremity/Trunk Assessment Upper Extremity Assessment Upper Extremity Assessment: Generalized weakness   Lower Extremity Assessment Lower Extremity Assessment: RLE deficits/detail;LLE deficits/detail RLE Deficits / Details: Pt maintains flexion and was only able to extend with assist to lacking full extenstion by 10 degrees, very little active movement noted LLE Deficits / Details: Pt maintains flexed knee to 75 degrees and could not extend knee even with PT assist   Cervical / Trunk Assessment Cervical / Trunk Assessment: Kyphotic   Communication Communication Communication: No difficulties   Cognition Arousal/Alertness: Awake/alert Behavior During Therapy: WFL for tasks assessed/performed Overall Cognitive Status: Within Functional Limits for tasks assessed                                 General Comments: min cues for day of week orientaion, but following all commands and very pleasant/conversational     General Comments  HR up to 124 sititng EOB    Exercises Exercises: Other exercises Other Exercises Other Exercises:  Prolonged gentle stretch of LLE and sititng EOB.   Shoulder Instructions      Home Living Family/patient expects to be discharged to:: Private residence Living Arrangements: Other relatives Available Help at Discharge: Family;Available PRN/intermittently (sister works M-F 8-5) Type of Home: House Home Access: Stairs to enter [employer] of Steps: 4 Entrance Stairs-Rails: Right;Left Home Layout: Two level;Bed/bath upstairs Alternate Level Stairs-Number of Steps: flight Alternate Level Stairs-Rails: Right;Left Bathroom Shower/Tub: Chief Strategy Officer: Standard     Home Equipment: Rollator (4 wheels);Tub bench;Wheelchair - manual;BSC/3in1   Additional Comments: 2 walkers, one upstairs and one downstairs      Prior Functioning/Environment Prior Level of Function : Needs assist             Mobility Comments: Pt reports up until 2-3 weeeks ago, she could walk with Modif I, she also states she could transfer to wheelchair with Modif I ADLs Comments: Sister assist with showering, pt also needed assist for dressing.  Could groom herself per pt        OT Problem List: Decreased strength;Decreased activity tolerance;Impaired balance (sitting and/or standing);Decreased coordination;Decreased knowledge of use of DME or AE;Pain      OT Treatment/Interventions: Self-care/ADL training;Therapeutic exercise;DME and/or AE instruction;Balance training;Patient/family education;Therapeutic activities    OT Goals(Current goals can be found in the care plan section) Acute Rehab OT  Goals Patient Stated Goal: get better OT Goal Formulation: With patient Time For Goal Achievement: 08/19/22 Potential to Achieve Goals: Good  OT Frequency: Min 2X/week    Co-evaluation              AM-PAC OT "6 Clicks" Daily Activity     Outcome Measure Help from another person eating meals?: A Little Help from another person taking care of personal grooming?: A Little Help  from another person toileting, which includes using toliet, bedpan, or urinal?: Total Help from another person bathing (including washing, rinsing, drying)?: A Lot Help from another person to put on and taking off regular upper body clothing?: A Lot Help from another person to put on and taking off regular lower body clothing?: Total 6 Click Score: 12   End of Session Nurse Communication: Mobility status  Activity Tolerance: Patient tolerated treatment well;Patient limited by pain Patient left: in bed;with call bell/phone within reach;with bed alarm set  OT Visit Diagnosis: Muscle weakness (generalized) (M62.81);Pain;Unsteadiness on feet (R26.81) Pain - Right/Left: Left Pain - part of body:  (BLE)                Time: 6803-2122 OT Time Calculation (min): 34 min Charges:  OT General Charges $OT Visit: 1 Visit OT Evaluation $OT Eval Moderate Complexity: 1 Mod OT Treatments $Self Care/Home Management : 8-22 mins  Tyler Deis, OTR/L [facility] Acute Rehabilitation Office: [phone]   Dana Turner 08/05/2022, 5:53 PM

## 2022-08-06 DIAGNOSIS — R29898 Other symptoms and signs involving the musculoskeletal system: Secondary | ICD-10-CM | POA: Diagnosis not present

## 2022-08-06 LAB — URINE CULTURE: Culture: >=100000 — AB

## 2022-08-06 LAB — CK: Total CK: 1426 U/L — ABNORMAL HIGH (ref 38–234)

## 2022-08-06 LAB — MAGNESIUM: Magnesium: 1.6 mg/dL — ABNORMAL LOW (ref 1.7–2.4)

## 2022-08-06 LAB — PHOSPHORUS: Phosphorus: 3 mg/dL (ref 2.5–4.6)

## 2022-08-06 MED ORDER — MAGNESIUM SULFATE 2 GM/50ML IV SOLN
2.0000 g | Freq: Once | INTRAVENOUS | Status: AC
  Administered 2022-08-06: 2 g via INTRAVENOUS
  Filled 2022-08-06: qty 50

## 2022-08-06 MED ORDER — CEFADROXIL 500 MG PO CAPS
500.0000 mg | ORAL_CAPSULE | Freq: Two times a day (BID) | ORAL | Status: DC
  Administered 2022-08-07 – 2022-08-16 (×19): 500 mg via ORAL
  Filled 2022-08-06 (×21): qty 1

## 2022-08-06 NOTE — Care Management Important Message (Signed)
Important Message  Patient Details  Name: Dana Turner MRN: [id number] Date of Birth: June 11, 1966   Medicare Important Message Given:  Yes     Dana Turner Stefan Church 08/06/2022, 3:18 PM

## 2022-08-06 NOTE — TOC Progression Note (Signed)
Transition of Care [facility]) - Progression Note    Patient Details  Name: Dana Turner MRN: [id number] Date of Birth: Nov 27, 1965  [facility] CM/SW Contact  Tory Emerald, [place] Phone Number: 08/06/2022, 2:53 PM  Clinical Narrative:    CSW reviewed bed offers and called pt's sister, Steward Drone to review. Steward Drone will f/u with CSW on Monday with bed choice. Steward Drone asked about emergency Medicaid since pt is in the hospital. CSW informed her CSW is unfamiliar with emergency Medicaid and will need to f/u with her Medicaid worker to get Medicaid reinstated.    Expected Discharge Plan: Skilled Nursing Facility Barriers to Discharge: Continued Medical Work up  Expected Discharge Plan and Services Expected Discharge Plan: Skilled Nursing Facility                                               Social Determinants of Health (SDOH) Interventions    Readmission Risk Interventions     No data to display

## 2022-08-06 NOTE — Plan of Care (Signed)
  Problem: Education: Goal: Knowledge of General Education information will improve Description Including pain rating scale, medication(s)/side effects and non-pharmacologic comfort measures Outcome: Progressing   Problem: Health Behavior/Discharge Planning: Goal: Ability to manage health-related needs will improve Outcome: Progressing   

## 2022-08-06 NOTE — Progress Notes (Signed)
Physical Therapy Treatment Patient Details Name: Dana Turner MRN: [id number] DOB: 12-Jan-1966 Today's Date: 08/06/2022   History of Present Illness Theodore Rahrig is a 56 y.o. female who presented to [facility] ED from home with complaints of unilateral left leg pain and bilateral leg weakness to the point that she mostly stayed in her wheelchair for a week.  No falls or trauma.  At baseline, the patient ambulates with a walker. PMH: fibromyalgia, urinary incontinence, MS (currently on q6 month IM Ocrevus), lupus, left eye blindness    PT Comments    Pt greeted sidelying in bed and agreeable to session with incremental progress towards goals. Pt continues to be limited by pain and contracture/tightness of LLE. Increased time spent at start of session for gentle ROM and stretching with minimal improvement. Pt continues to require significant assist for all bed mobility and transfers with pt demonstrating increased spasticity this session and very poor balance in standing as pt unable to bring LLE to floor.  HR up to 142bpm in standing, therefore returned pt to supine with HR down to 110 with rest. Current plan remains appropriate to address deficits and maximize functional independence and decrease caregiver burden. Pt continues to benefit from skilled PT services to progress toward functional mobility goals.    Recommendations for follow up therapy are one component of a multi-disciplinary discharge planning process, led by the attending physician.  Recommendations may be updated based on patient status, additional functional criteria and insurance authorization.  Follow Up Recommendations  Skilled nursing-short term rehab (<3 hours/day) Can patient physically be transported by private vehicle: No   Assistance Recommended at Discharge Frequent or constant Supervision/Assistance  Patient can return home with the following A lot of help with walking and/or transfers;A lot of help with  bathing/dressing/bathroom;Assistance with cooking/housework;Assist for transportation;Help with stairs or ramp for entrance   Equipment Recommendations  Other (comment) (TBA)    Recommendations for Other Services       Precautions / Restrictions Precautions Precautions: Fall Restrictions Weight Bearing Restrictions: No     Mobility  Bed Mobility Overal bed mobility: Needs Assistance Bed Mobility: Rolling, Supine to Sit, Sit to Supine Rolling: +2 for physical assistance, +2 for safety/equipment, Max assist   Supine to sit: Total assist, +2 for physical assistance, +2 for safety/equipment Sit to supine: Total assist, +2 for physical assistance, +2 for safety/equipment   General bed mobility comments: Rolling initally with min A, but up to max A +2 to achieve full sidelying due to tightness in BLE, hips/core    Transfers Overall transfer level: Needs assistance Equipment used: 2 person hand held assist Transfers: Sit to/from Stand Sit to Stand: Total assist, +2 physical assistance           General transfer comment: total asssit to come to stand on RLE as LLE flexed    Ambulation/Gait                   Stairs             Wheelchair Mobility    Modified Rankin (Stroke Patients Only)       Balance Overall balance assessment: Needs assistance Sitting-balance support: Bilateral upper extremity supported Sitting balance-Leahy Scale: Poor Sitting balance - Comments: Up to max A at times  to maintain sitting balance   Standing balance support: Bilateral upper extremity supported Standing balance-Leahy Scale: Zero  Cognition Arousal/Alertness: Awake/alert Behavior During Therapy: WFL for tasks assessed/performed Overall Cognitive Status: Within Functional Limits for tasks assessed                                 General Comments: following all commands and very pleasant/conversational         Exercises Other Exercises Other Exercises: Prolonged gentle stretch of LLE    General Comments General comments (skin integrity, edema, etc.): HR up rto 142bpm in standing, return to supine      Pertinent Vitals/Pain Pain Assessment Pain Assessment: Faces Faces Pain Scale: Hurts whole lot Pain Location: BLE Pain Descriptors / Indicators: Discomfort, Grimacing, Guarding Pain Intervention(s): Monitored during session, Limited activity within patient's tolerance    Home Living                          Prior Function            PT Goals (current goals can now be found in the care plan section) Acute Rehab PT Goals Patient Stated Goal: to get better to go home PT Goal Formulation: With patient Time For Goal Achievement: 08/18/22    Frequency    Min 3X/week      PT Plan      Co-evaluation              AM-PAC PT "6 Clicks" Mobility   Outcome Measure  Help needed turning from your back to your side while in a flat bed without using bedrails?: Total Help needed moving from lying on your back to sitting on the side of a flat bed without using bedrails?: Total Help needed moving to and from a bed to a chair (including a wheelchair)?: Total Help needed standing up from a chair using your arms (e.g., wheelchair or bedside chair)?: Total Help needed to walk in hospital room?: Total Help needed climbing 3-5 steps with a railing? : Total 6 Click Score: 6    End of Session Equipment Utilized During Treatment: Gait belt Activity Tolerance: Patient limited by fatigue;Patient limited by pain Patient left: with call bell/phone within reach;in bed;with bed alarm set Nurse Communication: Mobility status PT Visit Diagnosis: Muscle weakness (generalized) (M62.81);Pain Pain - part of body:  (back)     Time: 5638-7564 PT Time Calculation (min) (ACUTE ONLY): 20 min  Charges:  $Therapeutic Activity: 8-22 mins                     Jove Beyl R. PTA Acute  Rehabilitation Services Office: [phone]    Catalina Antigua 08/06/2022, 3:23 PM

## 2022-08-06 NOTE — Progress Notes (Signed)
PROGRESS NOTE    Dana Turner  [id number] DOB: [birth year] DOA: 08/03/2022  PCP: Horald Pollen, MD   Brief Narrative:  This 56 years old female with PMH significant for fibromyalgia, urinary retention, MS(currently on q6 month IM Ocrevus), lupus, left eye blindness presented in the ED from home with complaints of unilateral left leg pain and bilateral leg weakness to the point where she mostly stayed in her wheelchair for a week.  She denies any fall or trauma.  At baseline she ambulates with a walker. She also reports having burning urination.  ED physician has discussed the case with neurology who recommended MRI brain,  cervical and thoracic spine to rule out MS flare as the cause of her symptoms.  She has developed acute urinary retention requiring in and out cath.  Patient was empirically started on Rocephin for presumptive UTI.  MRI is unremarkable for evidence of acute MS flare.  No evidence of demyelination.She reports doing better.   Assessment & Plan:   Principal Problem:   Weakness of both legs   B/L LE weakness and Left LE unilateral pain: Etiology unclear.  Could be due to UTI. MRI brain, cervical and thoracic spine unremarkable for evidence of acute MS flare.  No evidence of demyelination. Continue gabapentin. B/L LE  venous duplex negative for DVT. Continue adequate pain control and bowel regimen. PT and OT recommended skilled nursing facility. Fall precautions.  E. coli UTI POA: Patient reports burning urination for the last 3 days. Urine culture grew E. Coli pan-sensitive. Continue ceftriaxone.   Acute kidney injury: > Resolved. Likely prerenal in the setting of dehydration from poor p.o. intake Baseline creatinine 0.74, presented with creatinine 1.45. Avoid nephrotoxic medications.   Renal functions improved with IV hydration.   Anion gap metabolic acidosis in setting of AKI Serum bicarb 17, anion gap 11 Resolved with IV hydration.   Isolated  elevated AST: Suspect from alcohol use. Monitor for now   Elevated CPK CPK 614> [id number]. Patient denies any trauma or fall. Continue IV hydration.  Hold Lipitor. CPK level improving.   Hypophosphatemia: Replaced.  Continue to monitor  Hypomagnesemia: Replaced.  Continue to monitor   DVT prophylaxis: Lovenox Code Status: Full code Family Communication: No family at bed side. Disposition Plan:   Status is: Inpatient Remains inpatient appropriate because: Admitted for bilateral lower extremity weakness, MRI without acute demyelination.  UA consistent with UTI,  cultures growing E. coli , continued on IV antibiotics.  PT OT recommended SNF.    Consultants:  None  Procedures: NONE  Antimicrobials: Ceftriaxone  Subjective: Patient was seen and examined at bedside.  Overnight events noted. Patient reports feeling much improved. She is still having burning while urination.  Objective: Vitals:   08/05/22 2011 08/05/22 2314 08/06/22 0315 08/06/22 0818  BP: (!) 135/92 119/84 (!) 181/89 107/81  Pulse: 97 90 (!) 105 85  Resp: 20 16 17 20   Temp: 98.2 F (36.8 C) 98 F (36.7 C) (!) 97.3 F (36.3 C) 98.6 F (37 C)  TempSrc: Oral Oral Oral Oral  SpO2: 97% 96% 98% 100%  Weight:      Height:        Intake/Output Summary (Last 24 hours) at 08/06/2022 1049 Last data filed at 08/06/2022 0300 Gross per 24 hour  Intake 1117.48 ml  Output --  Net 1117.48 ml   Filed Weights   08/05/22 0250  Weight: 49.8 kg    Examination:  General exam: Appears comfortable, not in any acute distress,  deconditioned. Respiratory system: CTA bilaterally, respiratory effort normal, RR 15 Cardiovascular system: S1-S2 heard, regular rate and rhythm, no murmur. Gastrointestinal system: Abdomen is soft, non tender, non distended, BS+ Central nervous system: Alert and oriented x 3, no focal neurological deficits. Extremities: No edema, no cyanosis, no clubbing Skin: No rashes, lesions or  ulcers Psychiatry: Judgement and insight appear normal. Mood & affect appropriate.     Data Reviewed: I have personally reviewed following labs and imaging studies  CBC: Recent Labs  Lab 08/03/22 1333 08/04/22 0555 08/05/22 0527  WBC 8.1 7.2 6.4  NEUTROABS 5.8  --   --   HGB 13.4 12.8 11.9*  HCT 42.4 39.1 37.0  MCV 80.9 80.8 79.4*  PLT 272 227 245   Basic Metabolic Panel: Recent Labs  Lab 08/03/22 1333 08/04/22 0555 08/05/22 0527 08/06/22 0354  NA 136  --  137  --   K 3.8  --  3.8  --   CL 108  --  110  --   CO2 17*  --  20*  --   GLUCOSE 118*  --  115*  --   BUN 55*  --  26*  --   CREATININE 1.45* 1.00 0.94  --   CALCIUM 9.3  --  9.1  --   MG 2.2  --  1.8 1.6*  PHOS  --   --  2.4* 3.0   GFR: Estimated Creatinine Clearance: 53.2 mL/min (by C-G formula based on SCr of 0.94 mg/dL). Liver Function Tests: Recent Labs  Lab 08/03/22 1333 08/05/22 0527  AST 44* 83*  ALT 27 36  ALKPHOS 64 48  BILITOT 0.7 0.6  PROT 7.1 6.1*  ALBUMIN 3.2* 2.7*   No results for input(s): "LIPASE", "AMYLASE" in the last 168 hours. No results for input(s): "AMMONIA" in the last 168 hours. Coagulation Profile: No results for input(s): "INR", "PROTIME" in the last 168 hours. Cardiac Enzymes: Recent Labs  Lab 08/03/22 1333 08/05/22 0527 08/06/22 0354  CKTOTAL 614* 2,701* 1,426*   BNP (last 3 results) No results for input(s): "PROBNP" in the last 8760 hours. HbA1C: No results for input(s): "HGBA1C" in the last 72 hours. CBG: No results for input(s): "GLUCAP" in the last 168 hours. Lipid Profile: No results for input(s): "CHOL", "HDL", "LDLCALC", "TRIG", "CHOLHDL", "LDLDIRECT" in the last 72 hours. Thyroid Function Tests: No results for input(s): "TSH", "T4TOTAL", "FREET4", "T3FREE", "THYROIDAB" in the last 72 hours. Anemia Panel: No results for input(s): "VITAMINB12", "FOLATE", "FERRITIN", "TIBC", "IRON", "RETICCTPCT" in the last 72 hours. Sepsis Labs: No results for  input(s): "PROCALCITON", "LATICACIDVEN" in the last 168 hours.  Recent Results (from the past 240 hour(s))  Urine Culture     Status: Abnormal   Collection Time: 08/04/22  4:53 AM   Specimen: Urine, Clean Catch  Result Value Ref Range Status   Specimen Description URINE, CLEAN CATCH  Final   Special Requests   Final    NONE Performed at [facility], [address], [place], [place] [address]    Culture >=100,000 COLONIES/mL ESCHERICHIA COLI (A)  Final   Report Status 08/06/2022 FINAL  Final   Organism ID, Bacteria ESCHERICHIA COLI (A)  Final      Susceptibility   Escherichia coli - MIC*    AMPICILLIN <=2 SENSITIVE Sensitive     CEFAZOLIN <=4 SENSITIVE Sensitive     CEFEPIME <=0.12 SENSITIVE Sensitive     CEFTRIAXONE <=0.25 SENSITIVE Sensitive     CIPROFLOXACIN <=0.25 SENSITIVE Sensitive  GENTAMICIN <=1 SENSITIVE Sensitive     IMIPENEM <=0.25 SENSITIVE Sensitive     NITROFURANTOIN <=16 SENSITIVE Sensitive     TRIMETH/SULFA <=20 SENSITIVE Sensitive     AMPICILLIN/SULBACTAM <=2 SENSITIVE Sensitive     PIP/TAZO <=4 SENSITIVE Sensitive     * >=100,000 COLONIES/mL ESCHERICHIA COLI    Radiology Studies: VAS [place] LOWER EXTREMITY VENOUS (DVT)  Result Date: 08/04/2022  Lower Venous DVT Study Patient Name:  JAMAL ANTROBUS  Date of Exam:   08/04/2022 Medical Rec #: [id number]       Accession #:    [id number] Date of Birth: 1965/11/19       Patient Gender: F Patient Age:   66 years Exam Location:  [facility] Procedure:      VAS [place] LOWER EXTREMITY VENOUS (DVT) Referring Phys: Archie Patten HALL --------------------------------------------------------------------------------  Indications: Bilateral lower extremity weakness, left leg pain.  Risk Factors: Immobility. Limitations: Patient positioning- significant pain with attempts to readjust. Comparison Study: No prior studies. Performing Technologist: Darlin Coco RDMS, RVT  Examination Guidelines: A complete evaluation includes B-mode  imaging, spectral Doppler, color Doppler, and power Doppler as needed of all accessible portions of each vessel. Bilateral testing is considered an integral part of a complete examination. Limited examinations for reoccurring indications may be performed as noted. The reflux portion of the exam is performed with the patient in reverse Trendelenburg.  +---------+---------------+---------+-----------+----------+---------------+ RIGHT    CompressibilityPhasicitySpontaneityPropertiesThrombus Aging  +---------+---------------+---------+-----------+----------+---------------+ CFV      Full           Yes      Yes                                  +---------+---------------+---------+-----------+----------+---------------+ SFJ      Full                                                         +---------+---------------+---------+-----------+----------+---------------+ FV Prox  Full                                                         +---------+---------------+---------+-----------+----------+---------------+ FV Mid   Full                                                         +---------+---------------+---------+-----------+----------+---------------+ FV DistalFull                                                         +---------+---------------+---------+-----------+----------+---------------+ PFV      Full                                                         +---------+---------------+---------+-----------+----------+---------------+  POP      Full           Yes      Yes                                  +---------+---------------+---------+-----------+----------+---------------+ PTV                     Yes      Yes                  Patent by color +---------+---------------+---------+-----------+----------+---------------+ PERO                    Yes      Yes                  Patent by color  +---------+---------------+---------+-----------+----------+---------------+   +---------+---------------+---------+-----------+----------+--------------+ LEFT     CompressibilityPhasicitySpontaneityPropertiesThrombus Aging +---------+---------------+---------+-----------+----------+--------------+ CFV      Full           Yes      Yes                                 +---------+---------------+---------+-----------+----------+--------------+ SFJ      Full                                                        +---------+---------------+---------+-----------+----------+--------------+ FV Prox  Full                                                        +---------+---------------+---------+-----------+----------+--------------+ FV Mid   Full                                                        +---------+---------------+---------+-----------+----------+--------------+ FV DistalFull                                                        +---------+---------------+---------+-----------+----------+--------------+ PFV      Full                                                        +---------+---------------+---------+-----------+----------+--------------+ POP      Full           Yes      Yes                                 +---------+---------------+---------+-----------+----------+--------------+ PTV      Full                                                        +---------+---------------+---------+-----------+----------+--------------+  PERO     Full                                                        +---------+---------------+---------+-----------+----------+--------------+     Summary: RIGHT: - There is no evidence of deep vein thrombosis in the lower extremity. However, portions of this examination were limited- see technologist comments above.  - No cystic structure found in the popliteal fossa.  LEFT: - There is no evidence of deep vein  thrombosis in the lower extremity.  - No cystic structure found in the popliteal fossa.  *See table(s) above for measurements and observations. Electronically signed by Coral Else MD on 08/04/2022 at 8:53:49 PM.    Final      Scheduled Meds:  enoxaparin (LOVENOX) injection  40 mg Subcutaneous Daily   ferrous sulfate  325 mg Oral BID WC   hydrALAZINE  50 mg Oral Q8H   senna-docusate  1 tablet Oral QHS   Continuous Infusions:  cefTRIAXone (ROCEPHIN)  IV 2 g (08/06/22 0832)     LOS: 2 days    Time spent: 35 mins    Macsen Nuttall, MD [facility]   If 7PM-7AM, please contact night-coverage

## 2022-08-07 DIAGNOSIS — R29898 Other symptoms and signs involving the musculoskeletal system: Secondary | ICD-10-CM | POA: Diagnosis not present

## 2022-08-07 LAB — MAGNESIUM: Magnesium: 1.9 mg/dL (ref 1.7–2.4)

## 2022-08-07 LAB — CK: Total CK: 682 U/L — ABNORMAL HIGH (ref 38–234)

## 2022-08-07 LAB — PHOSPHORUS: Phosphorus: 3.2 mg/dL (ref 2.5–4.6)

## 2022-08-07 MED ORDER — GABAPENTIN 600 MG PO TABS
300.0000 mg | ORAL_TABLET | Freq: Once | ORAL | Status: AC
  Administered 2022-08-07: 300 mg via ORAL
  Filled 2022-08-07: qty 1

## 2022-08-07 NOTE — Progress Notes (Signed)
PROGRESS NOTE    Dana Turner  [id number]:[id number] DOB: [birth year] DOA: 08/03/2022  PCP: Georgina Quint, MD   Brief Narrative:  This 56 years old female with PMH significant for fibromyalgia, urinary retention, MS(currently on q6 month IM Ocrevus), lupus, left eye blindness presented in the ED from home with complaints of unilateral left leg pain and bilateral leg weakness to the point where she mostly stayed in her wheelchair for a week.  She denies any fall or trauma.  At baseline she ambulates with a walker. She also reports having burning urination.  ED physician has discussed the case with neurology who recommended MRI brain,  cervical and thoracic spine to rule out MS flare as the cause of her symptoms.  She has developed acute urinary retention requiring in and out cath.  Patient was empirically started on Rocephin for presumptive UTI.  MRI is unremarkable for evidence of acute MS flare.  No evidence of demyelination.She reports doing better.   Assessment & Plan:   Principal Problem:   Weakness of both legs   B/L LE weakness and Left LE unilateral pain: Etiology unclear.  Could be due to UTI. MRI brain, cervical and thoracic spine unremarkable for evidence of acute MS flare.  No evidence of demyelination. Continue gabapentin. B/L LE  venous duplex negative for DVT. Continue adequate pain control and bowel regimen. PT and OT recommended skilled nursing facility. Fall precautions.  E. coli UTI POA: Patient reports burning urination for the last 3 days. Urine culture grew E. Coli pan-sensitive. Initiated on ceftriaxone then changed to [facility].   Acute kidney injury: > Resolved. Likely prerenal in the setting of dehydration from poor p.o. intake Baseline creatinine 0.74, presented with creatinine 1.45. Avoid nephrotoxic medications.   Renal functions improved with IV hydration.   Anion gap metabolic acidosis in setting of AKI Serum bicarb 17, anion gap 11 Resolved with IV  hydration.   Isolated elevated AST: Suspect from alcohol use. Monitor for now.   Elevated CPK CPK 614> 2701>1426 > 682. Patient denies any trauma or fall. Continue IV hydration.  Hold Lipitor. CPK level improving.   Hypophosphatemia: Replaced.  Resolved  Hypomagnesemia: Replaced.  Resolved   DVT prophylaxis: Lovenox Code Status: Full code Family Communication: No family at bed side. Disposition Plan:   Status is: Inpatient Remains inpatient appropriate because: Admitted for bilateral lower extremity weakness, MRI without acute demyelination.  UA consistent with UTI,  cultures growing E. coli , continued on IV antibiotics.  PT OT recommended SNF.    Consultants:  None  Procedures: NONE  Antimicrobials: Ceftriaxone  Subjective: Patient was seen and examined at bedside.  Overnight events noted. Patient reports feeling much improved.  She is still having burning while urination. She is awaiting placement in SNF pending insurance authorization.  Objective: Vitals:   08/06/22 2003 08/06/22 2338 08/07/22 0427 08/07/22 0751  BP: (!) 133/93 (!) 143/82 (!) 146/84 (!) 146/88  Pulse: 88 100 89 80  Resp: 16 15 18 17   Temp: 97.6 F (36.4 C) 98.3 F (36.8 C) 98.6 F (37 C) 97.8 F (36.6 C)  TempSrc: Oral Oral  Oral  SpO2: 96% 97% 99% 99%  Weight:      Height:        Intake/Output Summary (Last 24 hours) at 08/07/2022 1016 Last data filed at 08/07/2022 0800 Gross per 24 hour  Intake 960 ml  Output --  Net 960 ml   Filed Weights   08/05/22 0250  Weight: 49.8 kg  Examination:  General exam: Appears comfortable, not in any acute distress, deconditioned. Respiratory system: CTA bilaterally, respiratory effort normal, RR 14. Cardiovascular system: S1-S2 heard, regular rate and rhythm, no murmur. Gastrointestinal system: Abdomen is soft, non tender, non distended, BS+ Central nervous system: Alert and oriented x 3, no focal neurological deficits. Extremities: No  edema, no cyanosis, no clubbing Skin: No rashes, lesions or ulcers Psychiatry: Judgement and insight appear normal. Mood & affect appropriate.     Data Reviewed: I have personally reviewed following labs and imaging studies  CBC: Recent Labs  Lab 08/03/22 1333 08/04/22 0555 08/05/22 0527  WBC 8.1 7.2 6.4  NEUTROABS 5.8  --   --   HGB 13.4 12.8 11.9*  HCT 42.4 39.1 37.0  MCV 80.9 80.8 79.4*  PLT 272 227 245   Basic Metabolic Panel: Recent Labs  Lab 08/03/22 1333 08/04/22 0555 08/05/22 0527 08/06/22 0354 08/07/22 0313  NA 136  --  137  --   --   K 3.8  --  3.8  --   --   CL 108  --  110  --   --   CO2 17*  --  20*  --   --   GLUCOSE 118*  --  115*  --   --   BUN 55*  --  26*  --   --   CREATININE 1.45* 1.00 0.94  --   --   CALCIUM 9.3  --  9.1  --   --   MG 2.2  --  1.8 1.6* 1.9  PHOS  --   --  2.4* 3.0 3.2   GFR: Estimated Creatinine Clearance: 53.2 mL/min (by C-G formula based on SCr of 0.94 mg/dL). Liver Function Tests: Recent Labs  Lab 08/03/22 1333 08/05/22 0527  AST 44* 83*  ALT 27 36  ALKPHOS 64 48  BILITOT 0.7 0.6  PROT 7.1 6.1*  ALBUMIN 3.2* 2.7*   No results for input(s): "LIPASE", "AMYLASE" in the last 168 hours. No results for input(s): "AMMONIA" in the last 168 hours. Coagulation Profile: No results for input(s): "INR", "PROTIME" in the last 168 hours. Cardiac Enzymes: Recent Labs  Lab 08/03/22 1333 08/05/22 0527 08/06/22 0354 08/07/22 0313  CKTOTAL 614* 2,701* 1,426* 682*   BNP (last 3 results) No results for input(s): "PROBNP" in the last 8760 hours. HbA1C: No results for input(s): "HGBA1C" in the last 72 hours. CBG: No results for input(s): "GLUCAP" in the last 168 hours. Lipid Profile: No results for input(s): "CHOL", "HDL", "LDLCALC", "TRIG", "CHOLHDL", "LDLDIRECT" in the last 72 hours. Thyroid Function Tests: No results for input(s): "TSH", "T4TOTAL", "FREET4", "T3FREE", "THYROIDAB" in the last 72 hours. Anemia Panel: No  results for input(s): "VITAMINB12", "FOLATE", "FERRITIN", "TIBC", "IRON", "RETICCTPCT" in the last 72 hours. Sepsis Labs: No results for input(s): "PROCALCITON", "LATICACIDVEN" in the last 168 hours.  Recent Results (from the past 240 hour(s))  Urine Culture     Status: Abnormal   Collection Time: 08/04/22  4:53 AM   Specimen: Urine, Clean Catch  Result Value Ref Range Status   Specimen Description URINE, CLEAN CATCH  Final   Special Requests   Final    NONE Performed at [facility], [address], [place], [place] [address]    Culture >=100,000 COLONIES/mL ESCHERICHIA COLI (A)  Final   Report Status 08/06/2022 FINAL  Final   Organism ID, Bacteria ESCHERICHIA COLI (A)  Final      Susceptibility   Escherichia coli -  MIC*    AMPICILLIN <=2 SENSITIVE Sensitive     CEFAZOLIN <=4 SENSITIVE Sensitive     CEFEPIME <=0.12 SENSITIVE Sensitive     CEFTRIAXONE <=0.25 SENSITIVE Sensitive     CIPROFLOXACIN <=0.25 SENSITIVE Sensitive     GENTAMICIN <=1 SENSITIVE Sensitive     IMIPENEM <=0.25 SENSITIVE Sensitive     NITROFURANTOIN <=16 SENSITIVE Sensitive     TRIMETH/SULFA <=20 SENSITIVE Sensitive     AMPICILLIN/SULBACTAM <=2 SENSITIVE Sensitive     PIP/TAZO <=4 SENSITIVE Sensitive     * >=100,000 COLONIES/mL ESCHERICHIA COLI    Radiology Studies: No results found.   Scheduled Meds:  cefadroxil  500 mg Oral BID   enoxaparin (LOVENOX) injection  40 mg Subcutaneous Daily   ferrous sulfate  325 mg Oral BID WC   hydrALAZINE  50 mg Oral Q8H   Continuous Infusions:     LOS: 3 days    Time spent: 35 mins    Aspen Lawrance, MD [facility]   If 7PM-7AM, please contact night-coverage

## 2022-08-07 NOTE — Plan of Care (Signed)
  Problem: Education: Goal: Knowledge of General Education information will improve Description: Including pain rating scale, medication(s)/side effects and non-pharmacologic comfort measures Outcome: Progressing   Problem: Nutrition: Goal: Adequate nutrition will be maintained Outcome: Progressing   Problem: Health Behavior/Discharge Planning: Goal: Ability to manage health-related needs will improve Outcome: Not Progressing

## 2022-08-08 DIAGNOSIS — R29898 Other symptoms and signs involving the musculoskeletal system: Secondary | ICD-10-CM | POA: Diagnosis not present

## 2022-08-08 LAB — BASIC METABOLIC PANEL
Anion gap: 10 (ref 5–15)
BUN: 10 mg/dL (ref 6–20)
CO2: 24 mmol/L (ref 22–32)
Calcium: 8.8 mg/dL — ABNORMAL LOW (ref 8.9–10.3)
Chloride: 103 mmol/L (ref 98–111)
Creatinine, Ser: 0.63 mg/dL (ref 0.44–1.00)
GFR, Estimated: >60 mL/min (ref >60)
Glucose, Bld: 108 mg/dL — ABNORMAL HIGH (ref 70–99)
Potassium: 3.4 mmol/L — ABNORMAL LOW (ref 3.5–5.1)
Sodium: 137 mmol/L (ref 135–145)

## 2022-08-08 LAB — CBC
HCT: 34 % — ABNORMAL LOW (ref 36.0–46.0)
Hemoglobin: 11.3 g/dL — ABNORMAL LOW (ref 12.0–15.0)
MCH: 25.7 pg — ABNORMAL LOW (ref 26.0–34.0)
MCHC: 33.2 g/dL (ref 30.0–36.0)
MCV: 77.4 fL — ABNORMAL LOW (ref 80.0–100.0)
Platelets: 255 10*3/uL (ref 150–400)
RBC: 4.39 MIL/uL (ref 3.87–5.11)
RDW: 13.5 % (ref 11.5–15.5)
WBC: 4.7 10*3/uL (ref 4.0–10.5)
nRBC: 0 % (ref 0.0–0.2)

## 2022-08-08 LAB — CK: Total CK: 440 U/L — ABNORMAL HIGH (ref 38–234)

## 2022-08-08 MED ORDER — INFLUENZA VAC SPLIT QUAD 0.5 ML IM SUSY: 0.5000 mL | PREFILLED_SYRINGE | Freq: Once | INTRAMUSCULAR | Status: DC

## 2022-08-08 MED ORDER — POTASSIUM CHLORIDE 20 MEQ PO PACK
40.0000 meq | PACK | Freq: Once | ORAL | Status: AC
  Administered 2022-08-08: 40 meq via ORAL
  Filled 2022-08-08: qty 2

## 2022-08-08 MED ORDER — ORAL CARE MOUTH RINSE: 15.0000 mL | OROMUCOSAL | Status: DC | PRN

## 2022-08-08 NOTE — Progress Notes (Signed)
PROGRESS NOTE    Dana Turner  YQM:[id number] DOB: 07-30-1966 DOA: 08/03/2022  PCP: Georgina Quint, MD   Brief Narrative:  This 56 years old female with PMH significant for fibromyalgia, urinary retention, MS(currently on q6 month IM Ocrevus), lupus, left eye blindness presented in the ED from home with complaints of unilateral left leg pain and bilateral leg weakness to the point where she mostly stayed in her wheelchair for a week.  She denies any fall or trauma.  At baseline she ambulates with a walker. She also reports having burning urination.  ED physician has discussed the case with neurology who recommended MRI brain,  cervical and thoracic spine to rule out MS flare as the cause of her symptoms.  She has developed acute urinary retention requiring in and out cath.  Patient was empirically started on Rocephin for presumptive UTI.  MRI is unremarkable for evidence of acute MS flare.  No evidence of demyelination.She reports doing better.   Assessment & Plan:   Principal Problem:   Weakness of both legs   B/L LE weakness and Left LE unilateral pain: Etiology unclear.  Could be due to UTI. MRI brain, cervical and thoracic spine unremarkable for evidence of acute MS flare.  No evidence of demyelination. Continue gabapentin. B/L LE  venous duplex negative for DVT. Continue adequate pain control and bowel regimen. PT and OT recommended skilled nursing facility. Fall precautions.  E. coli UTI POA: Patient reports burning urination for the last 3 days. Urine culture grew E. Coli pan-sensitive. Initiated on ceftriaxone then changed to [facility].   Acute kidney injury: > Resolved. Likely prerenal in the setting of dehydration from poor p.o. intake Baseline creatinine 0.74, presented with creatinine 1.45. Avoid nephrotoxic medications.   Renal functions improved with IV hydration.   Anion gap metabolic acidosis in setting of AKI Serum bicarb 17, anion gap 11 Resolved with IV  hydration.   Isolated elevated AST: Suspect from alcohol use. Monitor for now.   Elevated CPK CPK 614> 2701>1426 > 682 >440. Patient denies any trauma or fall. Continue IV hydration.  Hold Lipitor. CPK level improving.   Hypophosphatemia: Replaced.  Resolved  Hypomagnesemia: Replaced.  Resolved  Hypokalemia: Replaced. Continue to monitor   DVT prophylaxis: Lovenox Code Status: Full code Family Communication: No family at bed side. Disposition Plan:   Status is: Inpatient Remains inpatient appropriate because: Admitted for bilateral lower extremity weakness, MRI without acute demyelination.  UA consistent with UTI,  cultures growing E. coli , continued on IV antibiotics.  PT OT recommended SNF.   Consultants:  None  Procedures: NONE  Antimicrobials: Ceftriaxone  Subjective: Patient was seen and examined at bedside.  Overnight events noted. Patient reports feeling much improved. She denies any burning urination today. She wants to sit in the chair to get some strength. She is awaiting placement in SNF pending insurance authorization.  Objective: Vitals:   08/07/22 1958 08/08/22 0059 08/08/22 0327 08/08/22 0736  BP: (!) 147/84 (!) 153/91 (!) 147/108 (!) 154/94  Pulse: 84 86 (!) 102 84  Resp: 20 19 17  (!) 21  Temp: 98.5 F (36.9 C) 99.1 F (37.3 C) 98.8 F (37.1 C) 99.1 F (37.3 C)  TempSrc: Oral  Oral Oral  SpO2: 97% 100% 95% 99%  Weight:      Height:        Intake/Output Summary (Last 24 hours) at 08/08/2022 0959 Last data filed at 08/08/2022 0100 Gross per 24 hour  Intake 960 ml  Output 125  ml  Net 835 ml   Filed Weights   08/05/22 0250  Weight: 49.8 kg    Examination:  General exam: Appears comfortable, not in any acute distress.  Deconditioned Respiratory system: CTA bilaterally, respiratory effort normal, RR 14. Cardiovascular system: S1-S2 heard, regular rate and rhythm, no murmur. Gastrointestinal system: Abdomen is soft, non tender,  non distended, BS+ Central nervous system: Alert and oriented x 3, no focal neurological deficits. Extremities: No edema, no cyanosis, no clubbing Skin: No rashes, lesions or ulcers Psychiatry: Mood and Affect appropriate.    Data Reviewed: I have personally reviewed following labs and imaging studies  CBC: Recent Labs  Lab 08/03/22 1333 08/04/22 0555 08/05/22 0527 08/08/22 0257  WBC 8.1 7.2 6.4 4.7  NEUTROABS 5.8  --   --   --   HGB 13.4 12.8 11.9* 11.3*  HCT 42.4 39.1 37.0 34.0*  MCV 80.9 80.8 79.4* 77.4*  PLT 272 227 245 255   Basic Metabolic Panel: Recent Labs  Lab 08/03/22 1333 08/04/22 0555 08/05/22 0527 08/06/22 0354 08/07/22 0313 08/08/22 0257  NA 136  --  137  --   --  137  K 3.8  --  3.8  --   --  3.4*  CL 108  --  110  --   --  103  CO2 17*  --  20*  --   --  24  GLUCOSE 118*  --  115*  --   --  108*  BUN 55*  --  26*  --   --  10  CREATININE 1.45* 1.00 0.94  --   --  0.63  CALCIUM 9.3  --  9.1  --   --  8.8*  MG 2.2  --  1.8 1.6* 1.9  --   PHOS  --   --  2.4* 3.0 3.2  --    GFR: Estimated Creatinine Clearance: 62.5 mL/min (by C-G formula based on SCr of 0.63 mg/dL). Liver Function Tests: Recent Labs  Lab 08/03/22 1333 08/05/22 0527  AST 44* 83*  ALT 27 36  ALKPHOS 64 48  BILITOT 0.7 0.6  PROT 7.1 6.1*  ALBUMIN 3.2* 2.7*   No results for input(s): "LIPASE", "AMYLASE" in the last 168 hours. No results for input(s): "AMMONIA" in the last 168 hours. Coagulation Profile: No results for input(s): "INR", "PROTIME" in the last 168 hours. Cardiac Enzymes: Recent Labs  Lab 08/03/22 1333 08/05/22 0527 08/06/22 0354 08/07/22 0313 08/08/22 0257  CKTOTAL 614* 2,701* 1,426* 682* 440*   BNP (last 3 results) No results for input(s): "PROBNP" in the last 8760 hours. HbA1C: No results for input(s): "HGBA1C" in the last 72 hours. CBG: No results for input(s): "GLUCAP" in the last 168 hours. Lipid Profile: No results for input(s): "CHOL", "HDL",  "LDLCALC", "TRIG", "CHOLHDL", "LDLDIRECT" in the last 72 hours. Thyroid Function Tests: No results for input(s): "TSH", "T4TOTAL", "FREET4", "T3FREE", "THYROIDAB" in the last 72 hours. Anemia Panel: No results for input(s): "VITAMINB12", "FOLATE", "FERRITIN", "TIBC", "IRON", "RETICCTPCT" in the last 72 hours. Sepsis Labs: No results for input(s): "PROCALCITON", "LATICACIDVEN" in the last 168 hours.  Recent Results (from the past 240 hour(s))  Urine Culture     Status: Abnormal   Collection Time: 08/04/22  4:53 AM   Specimen: Urine, Clean Catch  Result Value Ref Range Status   Specimen Description URINE, CLEAN CATCH  Final   Special Requests   Final    NONE Performed at [facility], [address], [place], [place] [address]    Culture >=100,000 COLONIES/mL ESCHERICHIA COLI (A)  Final   Report Status 08/06/2022 FINAL  Final   Organism ID, Bacteria ESCHERICHIA COLI (A)  Final      Susceptibility   Escherichia coli - MIC*    AMPICILLIN <=2 SENSITIVE Sensitive     CEFAZOLIN <=4 SENSITIVE Sensitive     CEFEPIME <=0.12 SENSITIVE Sensitive     CEFTRIAXONE <=0.25 SENSITIVE Sensitive     CIPROFLOXACIN <=0.25 SENSITIVE Sensitive     GENTAMICIN <=1 SENSITIVE Sensitive     IMIPENEM <=0.25 SENSITIVE Sensitive     NITROFURANTOIN <=16 SENSITIVE Sensitive     TRIMETH/SULFA <=20 SENSITIVE Sensitive     AMPICILLIN/SULBACTAM <=2 SENSITIVE Sensitive     PIP/TAZO <=4 SENSITIVE Sensitive     * >=100,000 COLONIES/mL ESCHERICHIA COLI    Radiology Studies: No results found.   Scheduled Meds:  cefadroxil  500 mg Oral BID   enoxaparin (LOVENOX) injection  40 mg Subcutaneous Daily   ferrous sulfate  325 mg Oral BID WC   hydrALAZINE  50 mg Oral Q8H   Continuous Infusions:     LOS: 4 days    Time spent: 35 mins    Shelah Heatley, MD [facility]   If 7PM-7AM, please contact night-coverage

## 2022-08-08 NOTE — Plan of Care (Signed)
  Problem: Nutrition: Goal: Adequate nutrition will be maintained Outcome: Progressing   Problem: Health Behavior/Discharge Planning: Goal: Ability to manage health-related needs will improve Outcome: Not Progressing   Problem: Coping: Goal: Level of anxiety will decrease Outcome: Not Progressing   

## 2022-08-09 ENCOUNTER — Inpatient Hospital Stay (HOSPITAL_COMMUNITY): Payer: Medicare HMO

## 2022-08-09 DIAGNOSIS — R29898 Other symptoms and signs involving the musculoskeletal system: Secondary | ICD-10-CM | POA: Diagnosis not present

## 2022-08-09 LAB — CK: Total CK: 349 U/L — ABNORMAL HIGH (ref 38–234)

## 2022-08-09 LAB — POTASSIUM: Potassium: 3.9 mmol/L (ref 3.5–5.1)

## 2022-08-09 NOTE — Progress Notes (Signed)
PROGRESS NOTE    Dana Turner  BOF:[id number] DOB: 09-Mar-1966 DOA: 08/03/2022  PCP: Georgina Quint, MD   Brief Narrative:  This 56 years old female with PMH significant for fibromyalgia, urinary retention, MS(currently on q6 month IM Ocrevus), lupus, left eye blindness presented in the ED from home with complaints of unilateral left leg pain and bilateral leg weakness to the point where she mostly stayed in her wheelchair for a week.  She denies any fall or trauma.  At baseline she ambulates with a walker. She also reports having burning urination.  ED physician has discussed the case with neurology who recommended MRI brain,  cervical and thoracic spine to rule out MS flare as the cause of her symptoms.  She has developed acute urinary retention requiring in and out cath.  Patient was empirically started on Rocephin for presumptive UTI.  MRI is unremarkable for evidence of acute MS flare.  No evidence of demyelination.She reports doing better.   Assessment & Plan:   Principal Problem:   Weakness of both legs   B/L LE weakness and Left LE unilateral pain: Etiology unclear.  Could be due to UTI. MRI brain, cervical and thoracic spine unremarkable for evidence of acute MS flare.  No evidence of demyelination. Continue gabapentin. B/L LE  venous duplex negative for DVT. Continue adequate pain control and bowel regimen. PT and OT recommended skilled nursing facility. Fall precautions.  E. coli UTI POA: Patient reports burning urination for the last 3 days. Urine culture grew E. Coli pan-sensitive. Initiated on ceftriaxone then changed to [facility].   Acute kidney injury: > Resolved. Likely prerenal in the setting of dehydration from poor p.o. intake Baseline creatinine 0.74, presented with creatinine 1.45. Avoid nephrotoxic medications.   Renal functions improved with IV hydration.   Anion gap metabolic acidosis in setting of AKI Serum bicarb 17, anion gap 11 Resolved with IV  hydration.   Isolated elevated AST: Suspect from alcohol use. Monitor for now.   Elevated CPK: CPK 614> 2701>1426 > 682 >440> 349 Patient denies any trauma or fall. Continue IV hydration.  Hold Lipitor. CPK level improving.   Hypophosphatemia: Replaced.  Resolved  Hypomagnesemia: Replaced.  Resolved  Hypokalemia: Replaced. Continue to monitor   DVT prophylaxis: Lovenox Code Status: Full code Family Communication: No family at bed side. Disposition Plan:   Status is: Inpatient Remains inpatient appropriate because: Admitted for bilateral lower extremity weakness, MRI without acute demyelination.  UA consistent with UTI,  cultures growing E. coli , continued on IV antibiotics.  PT OT recommended SNF.   Consultants:  None  Procedures: NONE  Antimicrobials: Ceftriaxone  Subjective: Patient was seen and examined at bedside. Overnight events noted. Patient reports feeling improved, denies any burning urination. She reports she sat on the chair, but had developed pain. She is awaiting placement in SNF pending insurance authorization.  Objective: Vitals:   08/09/22 0028 08/09/22 0032 08/09/22 0330 08/09/22 0809  BP:  (!) 147/129 (!) 160/89 (!) 154/107  Pulse:    70  Resp: (!) 25 18 20 19   Temp:  98.9 F (37.2 C) 98.3 F (36.8 C) 98.3 F (36.8 C)  TempSrc:   Oral Oral  SpO2:  99% 99% 100%  Weight:      Height:       No intake or output data in the 24 hours ending 08/09/22 1125  Filed Weights   08/05/22 0250  Weight: 49.8 kg    Examination:  General exam: Appears comfortable, not in  any acute distress.  Deconditioned. Respiratory system: CTA bilaterally, respiratory effort normal, RR 13. Cardiovascular system: S1-S2 heard, regular rate and rhythm, no murmur. Gastrointestinal system: Abdomen is soft, non tender, non distended, BS+ Central nervous system: Alert and oriented x 3, no focal neurological deficits. Extremities: No edema, no cyanosis, no  clubbing. Skin: No rashes, lesions or ulcers Psychiatry: Mood and Affect appropriate.    Data Reviewed: I have personally reviewed following labs and imaging studies  CBC: Recent Labs  Lab 08/03/22 1333 08/04/22 0555 08/05/22 0527 08/08/22 0257  WBC 8.1 7.2 6.4 4.7  NEUTROABS 5.8  --   --   --   HGB 13.4 12.8 11.9* 11.3*  HCT 42.4 39.1 37.0 34.0*  MCV 80.9 80.8 79.4* 77.4*  PLT 272 227 245 [id number]   Basic Metabolic Panel: Recent Labs  Lab 08/03/22 1333 08/04/22 0555 08/05/22 0527 08/06/22 0354 08/07/22 0313 08/08/22 0257 08/09/22 0349  NA 136  --  137  --   --  137  --   K 3.8  --  3.8  --   --  3.4* 3.9  CL 108  --  110  --   --  103  --   CO2 17*  --  20*  --   --  24  --   GLUCOSE 118*  --  115*  --   --  108*  --   BUN 55*  --  26*  --   --  10  --   CREATININE 1.45* 1.00 0.94  --   --  0.63  --   CALCIUM 9.3  --  9.1  --   --  8.8*  --   MG 2.2  --  1.8 1.6* 1.9  --   --   PHOS  --   --  2.4* 3.0 3.2  --   --    GFR: Estimated Creatinine Clearance: 62.5 mL/min (by C-G formula based on SCr of 0.63 mg/dL). Liver Function Tests: Recent Labs  Lab 08/03/22 1333 08/05/22 0527  AST 44* 83*  ALT 27 36  ALKPHOS 64 48  BILITOT 0.7 0.6  PROT 7.1 6.1*  ALBUMIN 3.2* 2.7*   No results for input(s): "LIPASE", "AMYLASE" in the last 168 hours. No results for input(s): "AMMONIA" in the last 168 hours. Coagulation Profile: No results for input(s): "INR", "PROTIME" in the last 168 hours. Cardiac Enzymes: Recent Labs  Lab 08/05/22 0527 08/06/22 0354 08/07/22 0313 08/08/22 0257 08/09/22 0349  CKTOTAL 2,701* 1,426* 682* 440* 349*   BNP (last 3 results) No results for input(s): "PROBNP" in the last 8760 hours. HbA1C: No results for input(s): "HGBA1C" in the last 72 hours. CBG: No results for input(s): "GLUCAP" in the last 168 hours. Lipid Profile: No results for input(s): "CHOL", "HDL", "LDLCALC", "TRIG", "CHOLHDL", "LDLDIRECT" in the last 72 hours. Thyroid  Function Tests: No results for input(s): "TSH", "T4TOTAL", "FREET4", "T3FREE", "THYROIDAB" in the last 72 hours. Anemia Panel: No results for input(s): "VITAMINB12", "FOLATE", "FERRITIN", "TIBC", "IRON", "RETICCTPCT" in the last 72 hours. Sepsis Labs: No results for input(s): "PROCALCITON", "LATICACIDVEN" in the last 168 hours.  Recent Results (from the past 240 hour(s))  Urine Culture     Status: Abnormal   Collection Time: 08/04/22  4:53 AM   Specimen: Urine, Clean Catch  Result Value Ref Range Status   Specimen Description URINE, CLEAN CATCH  Final   Special Requests   Final    NONE Performed at [facility], [address] [place], [place] [address]    Culture >=100,000 COLONIES/mL ESCHERICHIA COLI (A)  Final   Report Status 08/06/2022 FINAL  Final   Organism ID, Bacteria ESCHERICHIA COLI (A)  Final      Susceptibility   Escherichia coli - MIC*    AMPICILLIN <=2 SENSITIVE Sensitive     CEFAZOLIN <=4 SENSITIVE Sensitive     CEFEPIME <=0.12 SENSITIVE Sensitive     CEFTRIAXONE <=0.25 SENSITIVE Sensitive     CIPROFLOXACIN <=0.25 SENSITIVE Sensitive     GENTAMICIN <=1 SENSITIVE Sensitive     IMIPENEM <=0.25 SENSITIVE Sensitive     NITROFURANTOIN <=16 SENSITIVE Sensitive     TRIMETH/SULFA <=20 SENSITIVE Sensitive     AMPICILLIN/SULBACTAM <=2 SENSITIVE Sensitive     PIP/TAZO <=4 SENSITIVE Sensitive     * >=100,000 COLONIES/mL ESCHERICHIA COLI    Radiology Studies: No results found.   Scheduled Meds:  cefadroxil  500 mg Oral BID   enoxaparin (LOVENOX) injection  40 mg Subcutaneous Daily   ferrous sulfate  325 mg Oral BID WC   hydrALAZINE  50 mg Oral Q8H   Continuous Infusions:     LOS: 5 days    Time spent: 35 mins    Adyline Huberty, MD [facility]   If 7PM-7AM, please contact night-coverage

## 2022-08-10 DIAGNOSIS — R29898 Other symptoms and signs involving the musculoskeletal system: Secondary | ICD-10-CM | POA: Diagnosis not present

## 2022-08-10 MED ORDER — AMLODIPINE BESYLATE 5 MG PO TABS
5.0000 mg | ORAL_TABLET | Freq: Every day | ORAL | Status: DC
  Administered 2022-08-10 – 2022-08-16 (×7): 5 mg via ORAL
  Filled 2022-08-10 (×7): qty 1

## 2022-08-10 MED ORDER — OXYCODONE-ACETAMINOPHEN 5-325 MG PO TABS
1.0000 | ORAL_TABLET | Freq: Three times a day (TID) | ORAL | Status: DC | PRN
  Administered 2022-08-10 – 2022-08-16 (×14): 1 via ORAL
  Filled 2022-08-10 (×14): qty 1

## 2022-08-10 NOTE — Progress Notes (Addendum)
PROGRESS NOTE    Dana Turner  NTI:[id number] DOB: 08/08/1966 DOA: 08/03/2022  PCP: Georgina Quint, MD   Brief Narrative:  This 56 years old female with PMH significant for fibromyalgia, urinary retention, MS(currently on q6 month IM Ocrevus), lupus, left eye blindness presented in the ED from home with complaints of unilateral left leg pain and bilateral leg weakness to the point where she mostly stayed in her wheelchair for a week.  She denies any fall or trauma.  At baseline she ambulates with a walker. She also reports having burning urination.  ED physician has discussed the case with neurology who recommended MRI brain,  cervical and thoracic spine to rule out MS flare as the cause of her symptoms.  She has developed acute urinary retention requiring in and out cath.  Patient was empirically started on Rocephin for presumptive UTI.  MRI is unremarkable for evidence of acute MS flare.  No evidence of demyelination. She reports doing better.   Assessment & Plan:   Principal Problem:   Weakness of both legs  B/L LE weakness and Left LE unilateral pain: Etiology unclear.  Could be due to UTI. MRI brain, cervical and thoracic spine unremarkable for evidence of acute MS flare.  No evidence of demyelination. Continue gabapentin. B/L LE  venous duplex negative for DVT. Continue adequate pain control and bowel regimen. PT and OT recommended skilled nursing facility. Fall precautions.  E. coli UTI POA: Patient reports burning urination for the last 3 days. Urine culture grew E. Coli pan-sensitive. Initiated on ceftriaxone then changed to [facility].   Acute kidney injury: > Resolved. Likely prerenal in the setting of dehydration from poor p.o. intake Baseline creatinine 0.74, presented with creatinine 1.45. Avoid nephrotoxic medications.   Renal functions improved with IV hydration.   Anion gap metabolic acidosis in setting of AKI Serum bicarb 17, anion gap 11 Resolved with IV  hydration.   Isolated elevated AST: Suspect from alcohol use. Monitor for now.   Elevated CPK: CPK 614> 2701>1426 > 682 >440> 349 Patient denies any trauma or fall. Continue IV hydration.  Hold Lipitor. CPK level improving.   Hypophosphatemia: Replaced.  Resolved  Hypomagnesemia: Replaced.  Resolved  Hypokalemia: Replaced. Continue to monitor  Bilateral Knee Pain / Fibromyalgia: Could be due to osteoarthritis, fibromyalgia. Continue Percocet 5 mg every 8 hours as needed.  Multiple sclerosis: Continue Q6 month IM ocrevus.  DVT prophylaxis: Lovenox Code Status: Full code Family Communication: No family at bed side. Disposition Plan:   Status is: Inpatient Remains inpatient appropriate because: Admitted for bilateral lower extremity weakness, MRI without acute demyelination.  UA consistent with UTI,  cultures growing E. coli , continued on IV antibiotics.  PT OT recommended SNF.   Consultants:  None  Procedures: NONE  Antimicrobials: Ceftriaxone  Subjective: Patient seen and examined at bedside.  Overnight events noted. Patient reported urinary burning is completely resolved. She reports having worsening pain in both knees.  Denies any trauma. She is awaiting placement in SNF pending insurance authorization.  Objective: Vitals:   08/10/22 0350 08/10/22 0526 08/10/22 0800 08/10/22 0826  BP: (!) 173/96 (!) 163/100 (!) 201/122 (!) 156/100  Pulse: 75  (!) 101 77  Resp: 16  (!) 27 18  Temp: 98.4 F (36.9 C)  97.9 F (36.6 C) 98.6 F (37 C)  TempSrc: Oral  Oral Oral  SpO2: 99%  100% 100%  Weight:      Height:        Intake/Output Summary (Last  24 hours) at 08/10/2022 1048 Last data filed at 08/10/2022 8299 Gross per 24 hour  Intake --  Output 800 ml  Net -800 ml    Filed Weights   08/05/22 0250  Weight: 49.8 kg   Examination:  General exam: Appears comfortable, not in any acute distress.  Deconditioned. Respiratory system: CTA bilaterally,  respiratory effort normal, RR 13. Cardiovascular system: S1-S2 heard, regular rate and rhythm, no murmur. Gastrointestinal system: Abdomen is soft, non tender, non distended, BS+ Central nervous system: Alert and oriented x 3, no focal neurological deficits. Extremities:  Knee tenderness +, No edema, no cyanosis, no clubbing. Skin: No rashes, lesions or ulcers Psychiatry: Mood and Affect appropriate.    Data Reviewed: I have personally reviewed following labs and imaging studies  CBC: Recent Labs  Lab 08/03/22 1333 08/04/22 0555 08/05/22 0527 08/08/22 0257  WBC 8.1 7.2 6.4 4.7  NEUTROABS 5.8  --   --   --   HGB 13.4 12.8 11.9* 11.3*  HCT 42.4 39.1 37.0 34.0*  MCV 80.9 80.8 79.4* 77.4*  PLT 272 227 245 255   Basic Metabolic Panel: Recent Labs  Lab 08/03/22 1333 08/04/22 0555 08/05/22 0527 08/06/22 0354 08/07/22 0313 08/08/22 0257 08/09/22 0349  NA 136  --  137  --   --  137  --   K 3.8  --  3.8  --   --  3.4* 3.9  CL 108  --  110  --   --  103  --   CO2 17*  --  20*  --   --  24  --   GLUCOSE 118*  --  115*  --   --  108*  --   BUN 55*  --  26*  --   --  10  --   CREATININE 1.45* 1.00 0.94  --   --  0.63  --   CALCIUM 9.3  --  9.1  --   --  8.8*  --   MG 2.2  --  1.8 1.6* 1.9  --   --   PHOS  --   --  2.4* 3.0 3.2  --   --    GFR: Estimated Creatinine Clearance: 62.5 mL/min (by C-G formula based on SCr of 0.63 mg/dL). Liver Function Tests: Recent Labs  Lab 08/03/22 1333 08/05/22 0527  AST 44* 83*  ALT 27 36  ALKPHOS 64 48  BILITOT 0.7 0.6  PROT 7.1 6.1*  ALBUMIN 3.2* 2.7*   No results for input(s): "LIPASE", "AMYLASE" in the last 168 hours. No results for input(s): "AMMONIA" in the last 168 hours. Coagulation Profile: No results for input(s): "INR", "PROTIME" in the last 168 hours. Cardiac Enzymes: Recent Labs  Lab 08/05/22 0527 08/06/22 0354 08/07/22 0313 08/08/22 0257 08/09/22 0349  CKTOTAL 2,701* 1,426* 682* 440* 349*   BNP (last 3  results) No results for input(s): "PROBNP" in the last 8760 hours. HbA1C: No results for input(s): "HGBA1C" in the last 72 hours. CBG: No results for input(s): "GLUCAP" in the last 168 hours. Lipid Profile: No results for input(s): "CHOL", "HDL", "LDLCALC", "TRIG", "CHOLHDL", "LDLDIRECT" in the last 72 hours. Thyroid Function Tests: No results for input(s): "TSH", "T4TOTAL", "FREET4", "T3FREE", "THYROIDAB" in the last 72 hours. Anemia Panel: No results for input(s): "VITAMINB12", "FOLATE", "FERRITIN", "TIBC", "IRON", "RETICCTPCT" in the last 72 hours. Sepsis Labs: No results for input(s): "PROCALCITON", "LATICACIDVEN" in the last 168 hours.  Recent Results (from the past 240 hour(s))  Urine Culture  Status: Abnormal   Collection Time: 08/04/22  4:53 AM   Specimen: Urine, Clean Catch  Result Value Ref Range Status   Specimen Description URINE, CLEAN CATCH  Final   Special Requests   Final    NONE Performed at [facility], [address], [place], [place] [address]    Culture >=100,000 COLONIES/mL ESCHERICHIA COLI (A)  Final   Report Status 08/06/2022 FINAL  Final   Organism ID, Bacteria ESCHERICHIA COLI (A)  Final      Susceptibility   Escherichia coli - MIC*    AMPICILLIN <=2 SENSITIVE Sensitive     CEFAZOLIN <=4 SENSITIVE Sensitive     CEFEPIME <=0.12 SENSITIVE Sensitive     CEFTRIAXONE <=0.25 SENSITIVE Sensitive     CIPROFLOXACIN <=0.25 SENSITIVE Sensitive     GENTAMICIN <=1 SENSITIVE Sensitive     IMIPENEM <=0.25 SENSITIVE Sensitive     NITROFURANTOIN <=16 SENSITIVE Sensitive     TRIMETH/SULFA <=20 SENSITIVE Sensitive     AMPICILLIN/SULBACTAM <=2 SENSITIVE Sensitive     PIP/TAZO <=4 SENSITIVE Sensitive     * >=100,000 COLONIES/mL ESCHERICHIA COLI    Radiology Studies: DG Tibia/Fibula Right  Result Date: 08/09/2022 CLINICAL DATA:  Right lower leg pain EXAM: RIGHT TIBIA AND FIBULA - 2 VIEW COMPARISON:  None Available. FINDINGS: Postsurgical changes are noted.  No acute fracture or dislocation is noted. No soft tissue abnormality is seen. IMPRESSION: No acute abnormality noted. Electronically Signed   By: Alcide Clever M.D.   On: 08/09/2022 21:20   DG Tibia/Fibula Left  Result Date: 08/09/2022 CLINICAL DATA:  Left lower leg pain, initial encounter EXAM: LEFT TIBIA AND FIBULA - 2 VIEW COMPARISON:  None Available. FINDINGS: There is no evidence of fracture or other focal bone lesions. Soft tissues are unremarkable. IMPRESSION: No acute abnormality noted. Electronically Signed   By: Alcide Clever M.D.   On: 08/09/2022 21:20     Scheduled Meds:  amLODipine  5 mg Oral Daily   cefadroxil  500 mg Oral BID   enoxaparin (LOVENOX) injection  40 mg Subcutaneous Daily   ferrous sulfate  325 mg Oral BID WC   hydrALAZINE  50 mg Oral Q8H   Continuous Infusions:     LOS: 6 days    Time spent: 35 mins    Yamari Ventola, MD [facility]   If 7PM-7AM, please contact night-coverage

## 2022-08-10 NOTE — TOC Progression Note (Signed)
Transition of Care [facility]) - Progression Note    Patient Details  Name: Dana Turner MRN: [id number] Date of Birth: October 26, 1965  Transition of Care [facility]) CM/SW Contact  Tory Emerald, [place] Phone Number: 08/10/2022, 12:21 PM  Clinical Narrative:     CSW messaged Olegario Messier at [facility] to inquire about authorization.   Expected Discharge Plan: Skilled Nursing Facility Barriers to Discharge: Continued Medical Work up  Expected Discharge Plan and Services Expected Discharge Plan: Skilled Nursing Facility                                               Social Determinants of Health (SDOH) Interventions    Readmission Risk Interventions     No data to display

## 2022-08-10 NOTE — Progress Notes (Signed)
Attempted in and out catheterization x2,  attempts unsuccessful

## 2022-08-10 NOTE — Progress Notes (Signed)
Physical Therapy Treatment Patient Details Name: Dana Turner MRN: [id number] DOB: [birth year] Today's Date: 08/10/2022   History of Present Illness Dana Turner is a 56 y.o. female who presented to [facility] ED from home with complaints of unilateral left leg pain and bilateral leg weakness to the point that she mostly stayed in her wheelchair for a week.  No falls or trauma.  At baseline, the patient ambulates with a walker. PMH: fibromyalgia, urinary incontinence, MS (currently on q6 month IM Ocrevus), lupus, left eye blindness    PT Comments    Pt greeted supine in bed with slow progress towards goals this date as pt with elevated BP and increased pain. Session focused on bed level mobility and sustained stretching of LLE as pt with L hip and knee flexed on arrival. Pt able to reach overhead with RUE and assist to pull up in bed to reposition and partially roll with max assist to adjust pad and gown. Current plan remains appropriate to address deficits and maximize functional independence and decrease caregiver burden.   BP supine 181/100 HR 89-112   Recommendations for follow up therapy are one component of a multi-disciplinary discharge planning process, led by the attending physician.  Recommendations may be updated based on patient status, additional functional criteria and insurance authorization.  Follow Up Recommendations  Skilled nursing-short term rehab (<3 hours/day) Can patient physically be transported by private vehicle: No   Assistance Recommended at Discharge Frequent or constant Supervision/Assistance  Patient can return home with the following A lot of help with walking and/or transfers;A lot of help with bathing/dressing/bathroom;Assistance with cooking/housework;Assist for transportation;Help with stairs or ramp for entrance   Equipment Recommendations  Other (comment) (TBA)    Recommendations for Other Services       Precautions / Restrictions  Precautions Precautions: Fall Restrictions Weight Bearing Restrictions: No     Mobility  Bed Mobility Overal bed mobility: Needs Assistance Bed Mobility: Rolling Rolling: Max assist         General bed mobility comments: pt able to reach overhead with RUE to assist to pull up bed, assist to partial roll R/L for pad and gown adjustment    Transfers Overall transfer level: Needs assistance                 General transfer comment: deferred secondary to pain and elevated BP    Ambulation/Gait                   Stairs             Wheelchair Mobility    Modified Rankin (Stroke Patients Only)       Balance Overall balance assessment: Needs assistance Sitting-balance support: Bilateral upper extremity supported Sitting balance-Leahy Scale: Poor Sitting balance - Comments: Up to max A at times  to maintain sitting balance   Standing balance support: Bilateral upper extremity supported Standing balance-Leahy Scale: Zero                              Cognition Arousal/Alertness: Awake/alert Behavior During Therapy: WFL for tasks assessed/performed Overall Cognitive Status: Within Functional Limits for tasks assessed                                 General Comments: following all commands and very pleasant/conversational        Exercises Other Exercises Other Exercises:  Prolonged gentle stretch of LLE    General Comments General comments (skin integrity, edema, etc.): BP trending high, 200s/110s prior to session, 181/100 at start of session, transfers and sitting EOB deferred      Pertinent Vitals/Pain Pain Assessment Pain Assessment: Faces Faces Pain Scale: Hurts worst Pain Location: BLE Pain Descriptors / Indicators: Discomfort, Grimacing, Guarding Pain Intervention(s): Monitored during session, Limited activity within patient's tolerance, Patient requesting pain meds-RN notified, Repositioned    Home Living                           Prior Function            PT Goals (current goals can now be found in the care plan section) Acute Rehab PT Goals Patient Stated Goal: to get home and see christmass tree and get manicure PT Goal Formulation: With patient Time For Goal Achievement: 08/18/22 Progress towards PT goals: Not progressing toward goals - comment (pain and elevated BP)    Frequency    Min 3X/week      PT Plan      Co-evaluation              AM-PAC PT "6 Clicks" Mobility   Outcome Measure  Help needed turning from your back to your side while in a flat bed without using bedrails?: Total Help needed moving from lying on your back to sitting on the side of a flat bed without using bedrails?: Total Help needed moving to and from a bed to a chair (including a wheelchair)?: Total Help needed standing up from a chair using your arms (e.g., wheelchair or bedside chair)?: Total Help needed to walk in hospital room?: Total Help needed climbing 3-5 steps with a railing? : Total 6 Click Score: 6    End of Session Equipment Utilized During Treatment: Gait belt Activity Tolerance: Patient limited by pain;Treatment limited secondary to medical complications (Comment) (high BP) Patient left: with call bell/phone within reach;in bed;with bed alarm set Nurse Communication: Mobility status;Other (comment);Patient requests pain meds (BP reading) PT Visit Diagnosis: Muscle weakness (generalized) (M62.81);Pain Pain - part of body:  (back)     Time: 1010-1030 PT Time Calculation (min) (ACUTE ONLY): 20 min  Charges:  $Therapeutic Activity: 8-22 mins                     Cami Delawder R. PTA Acute Rehabilitation Services Office: [phone]    Catalina Antigua 08/10/2022, 10:49 AM

## 2022-08-11 DIAGNOSIS — N390 Urinary tract infection, site not specified: Principal | ICD-10-CM

## 2022-08-11 DIAGNOSIS — G35 Multiple sclerosis: Secondary | ICD-10-CM

## 2022-08-11 DIAGNOSIS — R338 Other retention of urine: Secondary | ICD-10-CM

## 2022-08-11 DIAGNOSIS — B9620 Unspecified Escherichia coli [E. coli] as the cause of diseases classified elsewhere: Secondary | ICD-10-CM

## 2022-08-11 LAB — CBC
HCT: 35.8 % — ABNORMAL LOW (ref 36.0–46.0)
Hemoglobin: 11.6 g/dL — ABNORMAL LOW (ref 12.0–15.0)
MCH: 25.6 pg — ABNORMAL LOW (ref 26.0–34.0)
MCHC: 32.4 g/dL (ref 30.0–36.0)
MCV: 79 fL — ABNORMAL LOW (ref 80.0–100.0)
Platelets: 323 10*3/uL (ref 150–400)
RBC: 4.53 MIL/uL (ref 3.87–5.11)
RDW: 13.7 % (ref 11.5–15.5)
WBC: 4.7 10*3/uL (ref 4.0–10.5)
nRBC: 0 % (ref 0.0–0.2)

## 2022-08-11 LAB — BASIC METABOLIC PANEL
Anion gap: 8 (ref 5–15)
BUN: 15 mg/dL (ref 6–20)
CO2: 26 mmol/L (ref 22–32)
Calcium: 9 mg/dL (ref 8.9–10.3)
Chloride: 100 mmol/L (ref 98–111)
Creatinine, Ser: 0.63 mg/dL (ref 0.44–1.00)
GFR, Estimated: >60 mL/min (ref >60)
Glucose, Bld: 109 mg/dL — ABNORMAL HIGH (ref 70–99)
Potassium: 3.8 mmol/L (ref 3.5–5.1)
Sodium: 134 mmol/L — ABNORMAL LOW (ref 135–145)

## 2022-08-11 LAB — MAGNESIUM: Magnesium: 2.1 mg/dL (ref 1.7–2.4)

## 2022-08-11 LAB — PHOSPHORUS: Phosphorus: 4.2 mg/dL (ref 2.5–4.6)

## 2022-08-11 MED ORDER — ATORVASTATIN CALCIUM 20 MG PO TABS: 20.0000 mg | ORAL_TABLET | Freq: Every day | ORAL | Status: AC

## 2022-08-11 MED ORDER — ACETAMINOPHEN 325 MG PO TABS: 650.0000 mg | ORAL_TABLET | Freq: Four times a day (QID) | ORAL | Status: AC | PRN

## 2022-08-11 MED ORDER — AMLODIPINE BESYLATE 5 MG PO TABS: 5.0000 mg | ORAL_TABLET | Freq: Every day | ORAL | Status: AC

## 2022-08-11 MED ORDER — OXYCODONE-ACETAMINOPHEN 5-325 MG PO TABS: 1.0000 | ORAL_TABLET | Freq: Three times a day (TID) | ORAL | 0 refills | Status: AC | PRN

## 2022-08-11 MED ORDER — CEFADROXIL 500 MG PO CAPS: 500.0000 mg | ORAL_CAPSULE | Freq: Two times a day (BID) | ORAL | 0 refills | Status: AC

## 2022-08-11 MED ORDER — POLYETHYLENE GLYCOL 3350 17 G PO PACK: 17.0000 g | PACK | Freq: Every day | ORAL | 0 refills | Status: AC | PRN

## 2022-08-11 NOTE — Discharge Summary (Signed)
Physician Discharge Summary   Patient: Dana Turner MRN: [id number] DOB: 12/23/1965  Admit date:     08/03/2022  Discharge date: 08/11/22  Discharge Physician: Marguerita Merles, DO   PCP: Georgina Quint, MD   Recommendations at discharge:   Follow up with PCP within 1-2 weeks Follow up with Neurology within 1-2 weeks   Discharge Diagnoses: Principal Problem:   Weakness of both legs  Resolved Problems:   * No resolved hospital problems. [facility] Course: This 56 years old female with PMH significant for fibromyalgia, urinary retention, MS(currently on q6 month IM Ocrevus), lupus, left eye blindness presented in the ED from home with complaints of unilateral left leg pain and bilateral leg weakness to the point where she mostly stayed in her wheelchair for a week.  She denies any fall or trauma.  At baseline she ambulates with a walker. She also reports having burning urination.  ED physician has discussed the case with neurology who recommended MRI brain,  cervical and thoracic spine to rule out MS flare as the cause of her symptoms.  She has developed acute urinary retention requiring in and out cath.  Patient was empirically started on Rocephin for presumptive UTI. MRI's are unremarkable for evidence of acute MS flare.  No evidence of demyelination. She reports doing better but remains significantly weak.  She states that she normally ambulates with a walker but remains too weak and PT OT recommending SNF.  She is medically stable to be discharged to skilled nursing facility once insurance authorization has been obtained.  Assessment and Plan:  B/L LE weakness and Left LE unilateral pain but could be a pseudo flare of MS from the urinary tract infection: -Etiology unclear.  Could be due to UTI. -MRI brain, cervical and thoracic spine unremarkable for evidence of acute MS flare.  No evidence of demyelination. -Continue gabapentin. -B/L LE  venous duplex negative for  DVT. -Continue adequate pain control and bowel regimen. -PT and OT recommended skilled nursing facility. -Fall precautions.   E. coli UTI POA: -Patient reports burning urination for the last 3 days. -Urine culture grew E. Coli pan-sensitive. -Initiated on ceftriaxone then changed to [facility] and is now improving   Acute kidney injury: > Resolved. -Likely prerenal in the setting of dehydration from poor p.o. intake -Baseline creatinine 0.74, presented with creatinine 1.45. -Improved with IV fluid hydration.  Patient's BUNs/creatinine is now 15/0.60 -Avoid further nephrotoxic medications, contrast dyes, hypotension and dehydration to ensure adequate renal perfusion -Repeat CMP in the a.m if still hospitalized .  Acute urinary retention -Has been getting In-N-Out caths will need to replace Foley catheter and have outpatient urological follow-up  Hyponatremia -Mild -Patient's sodium went from 137 is now 134 -Continue to monitor and trend and repeat CMP in a.m.  Microcytic Anemia -Patient's Hgb/Hct has gone from 12.8/39.1 -> 11.9/37.0 -> 11.3/34.0 -> 11.6/35.8 -Check Anemia Panel in the AM  -Continue to [place] for S/Sx of Bleeding; No overt Bleeding noted -Repeat CBC in the AM if still hospitalized    Anion gap metabolic acidosis in setting of AKI, improved  -Serum bicarb 17, anion gap 11 initially and is now improved; 2 is now 26, anion gap is 8, chloride level is 100 -Resolved with IV hydration. -Continue to monitor and trend and repeat CMP within 1 week   Isolated elevated AST: -Suspect from alcohol use. -Continue to Monitor for now and AST not obtained by can repeat in the a.m.   Elevated CPK, Mild Rhabdomyolysis  -  CPK 614> 2701>1426 > 682 >440> 349 -Patient denies any trauma or fall. -Continue IV hydration. Continue to Hold Atorvastatin. -CPK level improving and repeat as an outpatient    Hypophosphatemia: -Phos Level is now 4.2 -Continue to Monitor and Trend -Repeat  Phos Level in the AM   Hypomagnesemia: -Mag Level was 2.1   Hypokalemia: -Patient's K+ is now 3.8 -Continue to Monitor and Trend and repeat CMP in the AM    Bilateral Knee Pain / Fibromyalgia: -Could be due to osteoarthritis, fibromyalgia. -Continue Percocet 5 mg every 8 hours as needed.   Multiple Sclerosis -Continue Q6 month IM ocrevus. -Follow up with Neurology in the outpatient setting as she had no active Demyelination on MRI's  Pain control - [facility] Controlled Substance Reporting System database was reviewed. and patient was instructed, not to drive, operate heavy machinery, perform activities at heights, swimming or participation in water activities or provide baby-sitting services while on Pain, Sleep and Anxiety Medications; until their outpatient Physician has advised to do so again. Also recommended to not to take more than prescribed Pain, Sleep and Anxiety Medications.   Consultants: None Procedures performed: LE Venous Duplex  Disposition: Skilled nursing facility Diet recommendation:  Cardiac diet DISCHARGE MEDICATION: Allergies as of 08/11/2022   No Known Allergies      Medication List     STOP taking these medications    dalfampridine 10 MG Tb12 Commonly known as: Ampyra   gabapentin 300 MG capsule Commonly known as: NEURONTIN       TAKE these medications    acetaminophen 325 MG tablet Commonly known as: TYLENOL Take 2 tablets (650 mg total) by mouth every 6 (six) hours as needed for mild pain, fever or headache. What changed:  medication strength how much to take when to take this reasons to take this   amLODipine 5 MG tablet Commonly known as: NORVASC Take 1 tablet (5 mg total) by mouth daily. Start taking on: August 12, 2022 What changed:  medication strength how much to take   atorvastatin 20 MG tablet Commonly known as: LIPITOR Take 1 tablet (20 mg total) by mouth at bedtime. Start taking on: August 18, 2022 What  changed: These instructions start on August 18, 2022. If you are unsure what to do until then, ask your doctor or other care provider.   cefadroxil 500 MG capsule Commonly known as: DURICEF Take 1 capsule (500 mg total) by mouth 2 (two) times daily for 3 days.   FeroSul 325 (65 FE) MG tablet Generic drug: ferrous sulfate Take 325 mg by mouth daily with breakfast.   hydrALAZINE 25 MG tablet Commonly known as: APRESOLINE Take 25 mg by mouth 2 (two) times daily.   lisinopril 40 MG tablet Commonly known as: ZESTRIL Take 40 mg by mouth in the morning. What changed: Another medication with the same name was removed. Continue taking this medication, and follow the directions you see here.   oxyCODONE-acetaminophen 5-325 MG tablet Commonly known as: PERCOCET/ROXICET Take 1 tablet by mouth every 8 (eight) hours as needed for severe pain.   polyethylene glycol 17 g packet Commonly known as: MIRALAX / GLYCOLAX Take 17 g by mouth daily as needed for mild constipation.   sertraline 25 MG tablet Commonly known as: ZOLOFT Take 25 mg by mouth daily in the afternoon.   VITAMIN D-3 PO Take 1 capsule by mouth daily in the afternoon.        Discharge Exam: Filed Weights   08/05/22 0250  Weight: 49.8 kg   Vitals:   08/11/22 0841 08/11/22 1057  BP: (!) 160/86 (!) 155/80  Pulse:  64  Resp: 16 19  Temp:  98.7 F (37.1 C)  SpO2:  100%   Examination: Physical Exam:  Constitutional: Thin cachectic chronically ill-appearing African-American female in no acute distress Respiratory: Diminished to auscultation bilaterally, no wheezing, rales, rhonchi or crackles. Normal respiratory effort and patient is not tachypenic. No accessory muscle use.  Unlabored breathing Cardiovascular: RRR, no murmurs / rubs / gallops. S1 and S2 auscultated. No extremity edema.  Abdomen: Soft, non-tender, non-distended. Bowel sounds positive.  GU: Deferred. Musculoskeletal: No clubbing / cyanosis of  digits/nails. No joint deformity upper and lower extremities.  Skin: No rashes, lesions, ulcers on limited skin evaluation. No induration; Warm and dry.  Neurologic: CN 2-12 grossly intact with no focal deficits.  Romberg sign and cerebellar reflexes not assessed.  Psychiatric: Normal judgment and insight. Alert and oriented x 3. Normal mood and appropriate affect.   Condition at discharge: stable  The results of significant diagnostics from this hospitalization (including imaging, microbiology, ancillary and laboratory) are listed below for reference.   Imaging Studies: DG Tibia/Fibula Right  Result Date: 08/09/2022 CLINICAL DATA:  Right lower leg pain EXAM: RIGHT TIBIA AND FIBULA - 2 VIEW COMPARISON:  None Available. FINDINGS: Postsurgical changes are noted. No acute fracture or dislocation is noted. No soft tissue abnormality is seen. IMPRESSION: No acute abnormality noted. Electronically Signed   By: Alcide Clever M.D.   On: 08/09/2022 21:20   DG Tibia/Fibula Left  Result Date: 08/09/2022 CLINICAL DATA:  Left lower leg pain, initial encounter EXAM: LEFT TIBIA AND FIBULA - 2 VIEW COMPARISON:  None Available. FINDINGS: There is no evidence of fracture or other focal bone lesions. Soft tissues are unremarkable. IMPRESSION: No acute abnormality noted. Electronically Signed   By: Alcide Clever M.D.   On: 08/09/2022 21:20   VAS [place] LOWER EXTREMITY VENOUS (DVT)  Result Date: 08/04/2022  Lower Venous DVT Study Patient Name:  SHAQUAYA WUELLNER  Date of Exam:   08/04/2022 Medical Rec #: [id number]       Accession #:    [id number] Date of Birth: [birth year]       Patient Gender: F Patient Age:   56 years Exam Location:  [facility] Procedure:      VAS [place] LOWER EXTREMITY VENOUS (DVT) Referring Phys: Enid Derry HALL --------------------------------------------------------------------------------  Indications: Bilateral lower extremity weakness, left leg pain.  Risk Factors: Immobility. Limitations: Patient  positioning- significant pain with attempts to readjust. Comparison Study: No prior studies. Performing Technologist: Jean Rosenthal RDMS, RVT  Examination Guidelines: A complete evaluation includes B-mode imaging, spectral Doppler, color Doppler, and power Doppler as needed of all accessible portions of each vessel. Bilateral testing is considered an integral part of a complete examination. Limited examinations for reoccurring indications may be performed as noted. The reflux portion of the exam is performed with the patient in reverse Trendelenburg.  +---------+---------------+---------+-----------+----------+---------------+ RIGHT    CompressibilityPhasicitySpontaneityPropertiesThrombus Aging  +---------+---------------+---------+-----------+----------+---------------+ CFV      Full           Yes      Yes                                  +---------+---------------+---------+-----------+----------+---------------+ SFJ      Full                                                         +---------+---------------+---------+-----------+----------+---------------+  FV Prox  Full                                                         +---------+---------------+---------+-----------+----------+---------------+ FV Mid   Full                                                         +---------+---------------+---------+-----------+----------+---------------+ FV DistalFull                                                         +---------+---------------+---------+-----------+----------+---------------+ PFV      Full                                                         +---------+---------------+---------+-----------+----------+---------------+ POP      Full           Yes      Yes                                  +---------+---------------+---------+-----------+----------+---------------+ PTV                     Yes      Yes                  Patent by color  +---------+---------------+---------+-----------+----------+---------------+ PERO                    Yes      Yes                  Patent by color +---------+---------------+---------+-----------+----------+---------------+   +---------+---------------+---------+-----------+----------+--------------+ LEFT     CompressibilityPhasicitySpontaneityPropertiesThrombus Aging +---------+---------------+---------+-----------+----------+--------------+ CFV      Full           Yes      Yes                                 +---------+---------------+---------+-----------+----------+--------------+ SFJ      Full                                                        +---------+---------------+---------+-----------+----------+--------------+ FV Prox  Full                                                        +---------+---------------+---------+-----------+----------+--------------+ FV Mid   Full                                                        +---------+---------------+---------+-----------+----------+--------------+  FV DistalFull                                                        +---------+---------------+---------+-----------+----------+--------------+ PFV      Full                                                        +---------+---------------+---------+-----------+----------+--------------+ POP      Full           Yes      Yes                                 +---------+---------------+---------+-----------+----------+--------------+ PTV      Full                                                        +---------+---------------+---------+-----------+----------+--------------+ PERO     Full                                                        +---------+---------------+---------+-----------+----------+--------------+     Summary: RIGHT: - There is no evidence of deep vein thrombosis in the lower extremity. However, portions of this  examination were limited- see technologist comments above.  - No cystic structure found in the popliteal fossa.  LEFT: - There is no evidence of deep vein thrombosis in the lower extremity.  - No cystic structure found in the popliteal fossa.  *See table(s) above for measurements and observations. Electronically signed by Coral Else MD on 08/04/2022 at 8:53:49 PM.    Final    MR THORACIC SPINE W WO CONTRAST  Result Date: 08/04/2022 CLINICAL DATA:  Initial evaluation for demyelinating disease. EXAM: MRI THORACIC WITHOUT AND WITH CONTRAST TECHNIQUE: Multiplanar and multiecho pulse sequences of the thoracic spine were obtained without and with intravenous contrast. CONTRAST:  5mL GADAVIST GADOBUTROL 1 MMOL/ML IV SOLN COMPARISON:  Comparison made with prior MRI from 03/11/2021. FINDINGS: Alignment:  Examination degraded by motion artifact. Vertebral bodies normally aligned with preservation of the normal thoracic kyphosis. No listhesis. Vertebrae: Vertebral body height maintained without acute or chronic fracture. Bone marrow signal intensity within normal limits. No worrisome osseous lesions. No abnormal marrow edema or enhancement. Cord: Diffuse patchy signal abnormality again seen throughout the thoracic spinal cord, consistent with history of demyelinating disease. Involvement is fairly extensive with signal changes seen throughout the majority of the cord. In comparison with prior study, overall appearance is likely not significantly changed or progressed. No definite new lesions. No abnormal enhancement to suggest active demyelination. Paraspinal and other soft tissues: No acute finding. 2.1 cm simple left renal cyst, benign in appearance, no follow-up imaging recommended. Disc levels: Minimal for age disc degeneration within the midthoracic spine. No significant facet disease. No significant canal or  foraminal stenosis. IMPRESSION: Diffuse patchy signal abnormality throughout the thoracic spinal cord,  consistent with history of chronic demyelinating disease. Overall, appearance is not significantly changed or progressed as compared to prior study from 03/11/2021. No evidence for active demyelination. Electronically Signed   By: Rise Mu M.D.   On: 08/04/2022 02:13   MR Cervical Spine W or Wo Contrast  Result Date: 08/04/2022 CLINICAL DATA:  Initial evaluation for demyelinating disease. EXAM: MRI CERVICAL SPINE WITHOUT AND WITH CONTRAST TECHNIQUE: Multiplanar and multiecho pulse sequences of the cervical spine, to include the craniocervical junction and cervicothoracic junction, were obtained without and with intravenous contrast. CONTRAST:  69mL GADAVIST GADOBUTROL 1 MMOL/ML IV SOLN COMPARISON:  Prior MRI from 05/20/2021. FINDINGS: Alignment: Examination is degraded by motion artifact, most notably at the postcontrast sagittal sequence. Straightening with reversal of the normal cervical lordosis. No listhesis. Vertebrae: Vertebral body height maintained without acute or chronic fracture. Bone marrow signal intensity within normal limits. No discrete or worrisome osseous lesions. Mild discogenic reactive endplate changes present about the C4-5 and C5-6 interspaces. No other abnormal marrow edema. Cord: Again seen is extensive patchy signal abnormality throughout the cervical spinal cord, consistent with history of demyelinating disease/multiple sclerosis. Changes extend from the cervicomedullary junction through the visualized upper thoracic spinal cord, with involvement of essentially all levels. In comparison with previous exam, overall appearance is likely non significantly changed or progressed. No definite new lesions. No visible abnormal enhancement to suggest active demyelination. Posterior Fossa, vertebral arteries, paraspinal tissues: Unremarkable. Disc levels: C2-C3: Negative interspace. Mild left-sided facet hypertrophy. No canal or foraminal stenosis. C3-C4: Mild disc bulge with  right-sided uncovertebral and facet hypertrophy. No significant spinal stenosis. Foramina remain adequately patent. C4-C5: Right eccentric disc bulge. Associated right greater than left uncovertebral spurring. Flattening of the ventral thecal sac with no more than mild spinal stenosis. Moderate right C5 foraminal narrowing. Appearance is similar to prior. C5-C6: Small central disc protrusion indents the ventral thecal sac. No significant spinal stenosis or frank cord impingement. Mild uncovertebral spurring without significant foraminal stenosis. Appearance is stable. C6-C7: Small central disc protrusion mildly indents the ventral thecal sac. No significant spinal stenosis or cord impingement. Right-sided uncovertebral spurring without significant foraminal encroachment. Appearance is relatively similar. C7-T1:  Unremarkable. IMPRESSION: 1. Extensive patchy signal abnormality throughout the cervical spinal cord, consistent with history of chronic demyelinating disease/multiple sclerosis. Overall, appearance is not significantly changed or progressed as compared to previous MRI from 05/20/2021. No definite new lesions or evidence for active demyelination. 2. Mild multilevel cervical spondylosis, most pronounced at C4-5 where there is resultant mild spinal stenosis, with moderate right C5 foraminal narrowing. Electronically Signed   By: Rise Mu M.D.   On: 08/04/2022 02:04   MR Brain W and Wo Contrast  Result Date: 08/04/2022 CLINICAL DATA:  Initial evaluation for demyelinating disease. EXAM: MRI HEAD WITHOUT AND WITH CONTRAST TECHNIQUE: Multiplanar, multiecho pulse sequences of the brain and surrounding structures were obtained without and with intravenous contrast. CONTRAST:  50mL GADAVIST GADOBUTROL 1 MMOL/ML IV SOLN COMPARISON:  Prior MRI from 05/20/2020. FINDINGS: Brain: Examination mildly degraded by motion artifact. Advanced cerebral atrophy for age. Again seen is extensive white matter changes  consistent with history of demyelinating disease. There is involvement of both the supratentorial and infratentorial white matter, with involvement of the brainstem and cerebellum. Multiple corresponding T1 black holes. In comparison with previous exam, overall extent and pattern is not significantly changed or progressed. No associated enhancement or restricted diffusion to suggest  active demyelination. No evidence for acute or subacute infarct. No mass lesion, midline shift or mass effect. No hydrocephalus or extra-axial fluid collection. No evidence for acute intracranial hemorrhage. Single punctate chronic microhemorrhage noted within the right thalamus. Pituitary gland and suprasellar region within normal limits. Vascular: Major intracranial vascular flow voids are maintained. Skull and upper cervical spine: Craniocervical junction within normal limits. Bone marrow signal intensity normal. No scalp soft tissue abnormality. Sinuses/Orbits: Globes orbital soft tissues demonstrate no acute finding. Mild scattered mucosal thickening noted about the ethmoidal air cells and maxillary sinuses. No mastoid effusion. Other: None. IMPRESSION: 1. Extensive cerebral white matter disease, consistent with history of chronic demyelinating disease/multiple sclerosis. Overall, appearance is not significantly changed or progressed as compared to most recent MRI from 05/20/2021. No evidence for active demyelination. 2. Underlying advanced cerebral atrophy for age. 3. No other acute intracranial abnormality. Electronically Signed   By: Rise Mu M.D.   On: 08/04/2022 01:55   DG Knee 1-2 Views Right  Result Date: 08/03/2022 CLINICAL DATA:  Leg pain EXAM: RIGHT KNEE - 1-2 VIEW COMPARISON:  None Available. FINDINGS: Postoperative changes. No acute fracture, subluxation or dislocation. No joint effusion. Soft tissues are unremarkable. IMPRESSION: No acute bony abnormality. Electronically Signed   By: Charlett Nose M.D.    On: 08/03/2022 20:03   DG Knee 1-2 Views Left  Result Date: 08/03/2022 CLINICAL DATA:  Leg pain EXAM: LEFT KNEE - 1-2 VIEW COMPARISON:  None Available. FINDINGS: Limited study due to patient condition and positioning. No visible fracture, subluxation or dislocation. No joint effusion. IMPRESSION: No acute bony abnormality. Electronically Signed   By: Charlett Nose M.D.   On: 08/03/2022 20:02    Microbiology: Results for orders placed or performed during the hospital encounter of 08/03/22  Urine Culture     Status: Abnormal   Collection Time: 08/04/22  4:53 AM   Specimen: Urine, Clean Catch  Result Value Ref Range Status   Specimen Description URINE, CLEAN CATCH  Final   Special Requests   Final    NONE Performed at [facility], [address] [place], [place] [address]    Culture >=100,000 COLONIES/mL ESCHERICHIA COLI (A)  Final   Report Status 08/06/2022 FINAL  Final   Organism ID, Bacteria ESCHERICHIA COLI (A)  Final      Susceptibility   Escherichia coli - MIC*    AMPICILLIN <=2 SENSITIVE Sensitive     CEFAZOLIN <=4 SENSITIVE Sensitive     CEFEPIME <=0.12 SENSITIVE Sensitive     CEFTRIAXONE <=0.25 SENSITIVE Sensitive     CIPROFLOXACIN <=0.25 SENSITIVE Sensitive     GENTAMICIN <=1 SENSITIVE Sensitive     IMIPENEM <=0.25 SENSITIVE Sensitive     NITROFURANTOIN <=16 SENSITIVE Sensitive     TRIMETH/SULFA <=20 SENSITIVE Sensitive     AMPICILLIN/SULBACTAM <=2 SENSITIVE Sensitive     PIP/TAZO <=4 SENSITIVE Sensitive     * >=100,000 COLONIES/mL ESCHERICHIA COLI    Labs: CBC: Recent Labs  Lab 08/05/22 0527 08/08/22 0257 08/11/22 0428  WBC 6.4 4.7 4.7  HGB 11.9* 11.3* 11.6*  HCT 37.0 34.0* 35.8*  MCV 79.4* 77.4* 79.0*  PLT 245 255 323   Basic Metabolic Panel: Recent Labs  Lab 08/05/22 0527 08/06/22 0354 08/07/22 0313 08/08/22 0257 08/09/22 0349 08/11/22 0428  NA 137  --   --  137  --  134*  K 3.8  --   --  3.4* 3.9 3.8  CL 110  --   --  103  --  100  CO2  20*  --   --  24  --  26  GLUCOSE 115*  --   --  108*  --  109*  BUN 26*  --   --  10  --  15  CREATININE 0.94  --   --  0.63  --  0.63  CALCIUM 9.1  --   --  8.8*  --  9.0  MG 1.8 1.6* 1.9  --   --  2.1  PHOS 2.4* 3.0 3.2  --   --  4.2   Liver Function Tests: Recent Labs  Lab 08/05/22 0527  AST 83*  ALT 36  ALKPHOS 48  BILITOT 0.6  PROT 6.1*  ALBUMIN 2.7*   CBG: No results for input(s): "GLUCAP" in the last 168 hours.  Discharge time spent: greater than 30 minutes.  Signed: Marguerita Merles, DO [facility] 08/11/2022

## 2022-08-11 NOTE — TOC Progression Note (Signed)
Transition of Care [facility]) - Progression Note    Patient Details  Name: Dana Turner MRN: [id number] Date of Birth: 04/26/1966  Transition of Care [facility]) CM/SW Contact  Tory Emerald, [place] Phone Number: 08/11/2022, 9:40 AM  Clinical Narrative:     CSW messaged Olegario Messier at [facility] to inquire about status of auth.   Expected Discharge Plan: Skilled Nursing Facility Barriers to Discharge: Continued Medical Work up  Expected Discharge Plan and Services Expected Discharge Plan: Skilled Nursing Facility                                               Social Determinants of Health (SDOH) Interventions    Readmission Risk Interventions     No data to display

## 2022-08-11 NOTE — TOC Progression Note (Addendum)
Transition of Care [facility]) - Progression Note    Patient Details  Name: Dana Turner MRN: [id number] Date of Birth: Apr 25, 1966  Transition of Care [facility]) CM/SW Contact  [name], [place] Phone Number: 08/11/2022, 11:12 AM  Clinical Narrative:     CSW met with pt and helped her get her breakfast together, while discussing her apprehensions of going to SNF. Pt reports she would like to be home for the holidays. CSW discussed conversations with pt's sister, Hassan Rowan. Pt understands she needs SNF, but would like to be home for the holidays. CSW explained if she is able to go today or tomorrow and works diligently, she maybe able to d/c by Christmas. Pt was agreeable to SNF and current plan for [facility] received a message from [place] at [facility] care stating Mcarthur Rossetti is requesting a peer to peer before Josem Kaufmann can be approved. CSW messaged Dr. Alfredia Ferguson to make him aware.   Expected Discharge Plan: Skilled Nursing Facility Barriers to Discharge: Continued Medical Work up  Expected Discharge Plan and Services Expected Discharge Plan: [facility]                                               Social Determinants of Health (SDOH) Interventions    Readmission Risk Interventions     No data to display

## 2022-08-11 NOTE — Progress Notes (Signed)
Pt was bladder scanned for >445ml urine and straight cath for 500 this morning at 0530

## 2022-08-11 NOTE — Plan of Care (Signed)
  Problem: Clinical Measurements: Goal: Will remain free from infection Outcome: Progressing Goal: Diagnostic test results will improve Outcome: Progressing   Problem: Activity: Goal: Risk for activity intolerance will decrease Outcome: Progressing   Problem: Nutrition: Goal: Adequate nutrition will be maintained Outcome: Progressing   Problem: Coping: Goal: Level of anxiety will decrease Outcome: Progressing   Problem: Pain Managment: Goal: General experience of comfort will improve Outcome: Progressing   Problem: Skin Integrity: Goal: Risk for impaired skin integrity will decrease Outcome: Progressing

## 2022-08-11 NOTE — Progress Notes (Signed)
Dr. Imogene Burn was made aware that pt  had 334 ml of urine in her bladder at 2300.  I tried to straight cath her and was unsuccessful. Pt was last straight cath at 750 this morning with out. At 230pm she had 239 ml of urine in her bladder. Dr. Imogene Burn said to recheck bladder scanner at 0700 in the morning.

## 2022-08-12 ENCOUNTER — Inpatient Hospital Stay (HOSPITAL_COMMUNITY): Payer: Medicare HMO

## 2022-08-12 MED ORDER — METHOCARBAMOL 500 MG PO TABS
500.0000 mg | ORAL_TABLET | Freq: Four times a day (QID) | ORAL | Status: DC | PRN
  Administered 2022-08-12 – 2022-08-16 (×10): 500 mg via ORAL
  Filled 2022-08-12 (×10): qty 1

## 2022-08-12 MED ORDER — GABAPENTIN 300 MG PO CAPS
300.0000 mg | ORAL_CAPSULE | Freq: Three times a day (TID) | ORAL | Status: DC
  Administered 2022-08-12 – 2022-08-16 (×13): 300 mg via ORAL
  Filled 2022-08-12 (×13): qty 1

## 2022-08-12 MED ORDER — LIDOCAINE HCL URETHRAL/MUCOSAL 2 % EX GEL
1.0000 | Freq: Once | CUTANEOUS | Status: DC
  Filled 2022-08-12: qty 6

## 2022-08-12 MED ORDER — METHOCARBAMOL 1000 MG/10ML IJ SOLN: 500.0000 mg | Freq: Four times a day (QID) | INTRAVENOUS | Status: DC | PRN

## 2022-08-12 NOTE — Progress Notes (Signed)
Dr. Joneen Roach was made aware that pt was bladder scanned for 379 ml of urine bladder and that I  was unsuccessful at straight cath attempt.

## 2022-08-12 NOTE — Progress Notes (Signed)
Physical Therapy Treatment Patient Details Name: Dana Turner MRN: [id number] DOB: 1966/08/15 Today's Date: 08/12/2022   History of Present Illness Dana Turner is a 56 y.o. female who presented to [facility] ED from home with complaints of unilateral left leg pain and bilateral leg weakness to the point that she mostly stayed in her wheelchair for a week.  No falls or trauma.  At baseline, the patient ambulates with a walker. PMH: fibromyalgia, urinary incontinence, MS (currently on q6 month IM Ocrevus), lupus, left eye blindness    PT Comments    Extra time was spent today on education on the importance of mobility. Assessed pt LLE and pt is unable to extend the L knee/hip with passive/active ROM. Pt states that she would be able to transfer if she was at home. Pt was educated that at this level of functioning in the hospital she would not be able to safely transfer at home. Pt sits with RLE slightly extended and LLE in full flexion at hip/knee. Pt is unaware of her deficits and is very frustrated with her progress. Extra time was spent today educating pt on mobility, the importance of mobility and her current functional status as well as what that means for her on discharge from acute care setting including she will need 24 hour assistance with a lift if she is unable to progress due to her current functional status. Pt was understanding but tearful. Pt states that she wants to progress and believes she could progress if she went home because she is getting depressed in the hospital and cannot transfer herself. Pt was educated that she could benefit from physical therapy possibly but she would need to participate as much as she is able and pt stated understanding. Pt HR increases to 120-130 bpm with sitting EOB most likely related to pain in the L Hip/pelvic area. Pt will benefit from continued skilled physical therapy services in SNF setting in discharge from acute care hospital setting in order to  decrease need for physical assistance and increase independence to improve skin integrity and decrease risk for further immobility.  \   Recommendations for follow up therapy are one component of a multi-disciplinary discharge planning process, led by the attending physician.  Recommendations may be updated based on patient status, additional functional criteria and insurance authorization.  Follow Up Recommendations  Skilled nursing-short term rehab (<3 hours/day) Can patient physically be transported by private vehicle: No   Assistance Recommended at Discharge Frequent or constant Supervision/Assistance  Patient can return home with the following A lot of help with walking and/or transfers;Assistance with cooking/housework;Assist for transportation;Help with stairs or ramp for entrance   Equipment Recommendations  Other (comment) (defer to post acute at this time pt will benefit from recline W/C)    Recommendations for Other Services       Precautions / Restrictions Precautions Precautions: Fall Restrictions Weight Bearing Restrictions: No     Mobility  Bed Mobility Overal bed mobility: Needs Assistance Bed Mobility: Rolling, Supine to Sit, Sit to Supine Rolling: Max assist   Supine to sit: Max assist Sit to supine: Max assist   General bed mobility comments: Pt is able to intermittently follow commands, takes extra time and is unable to extend the L hip/Knee with A/ROM, AA/ROM or P/ROM demonstrating possible contracture that is painful possibly related to immobility Patient Response: Anxious  Transfers     General transfer comment: Pt was unable to perform transfer due to LLE postioning pt was able to sit  EOB and perform wgt shifting.    Ambulation/Gait       General Gait Details: Pt is unsafe for gait may benefit from W/C training due to LLE positoning       Balance Overall balance assessment: Needs assistance Sitting-balance support: Bilateral upper extremity  supported Sitting balance-Leahy Scale: Poor Sitting balance - Comments: Intermittent Max A especially with initially sitting, progressed to CGA with extension at the trunk and flexion of the LLE throughout Postural control: Posterior lean, Right lateral lean     Standing balance comment: Unable to progress to standing            Cognition Arousal/Alertness: Awake/alert Behavior During Therapy: Anxious Overall Cognitive Status: Difficult to assess               General Comments: Pt states that she was able to get around her house and get from bed to W/C but pt has a contracted LLE and has significant difficulty sitting EOB.            PT Goals (current goals can now be found in the care plan section) Acute Rehab PT Goals Patient Stated Goal: to get home and see christmass tree and get manicure PT Goal Formulation: With patient Time For Goal Achievement: 08/18/22 Potential to Achieve Goals: Good Progress towards PT goals: Progressing toward goals    Frequency    Min 3X/week      PT Plan Current plan remains appropriate       AM-PAC PT "6 Clicks" Mobility   Outcome Measure  Help needed turning from your back to your side while in a flat bed without using bedrails?: A Lot Help needed moving from lying on your back to sitting on the side of a flat bed without using bedrails?: A Lot Help needed moving to and from a bed to a chair (including a wheelchair)?: Total Help needed standing up from a chair using your arms (e.g., wheelchair or bedside chair)?: Total Help needed to walk in hospital room?: Total Help needed climbing 3-5 steps with a railing? : Total 6 Click Score: 8    End of Session Equipment Utilized During Treatment: Gait belt Activity Tolerance: Patient limited by pain Patient left: with call bell/phone within reach;in bed;with bed alarm set Nurse Communication: Mobility status PT Visit Diagnosis: Muscle weakness (generalized) (M62.81);Pain Pain  - part of body: Hip (L hip/pelvis)     Time: 1017-5102 PT Time Calculation (min) (ACUTE ONLY): 38 min  Charges:  $Therapeutic Activity: 38-52 mins                    Harrel Carina, DPT, CLT  Acute Rehabilitation Services Office: [phone] (Secure chat preferred)    Claudia Desanctis 08/12/2022, 10:10 AM

## 2022-08-12 NOTE — Progress Notes (Addendum)
Occupational Therapy Treatment Patient Details Name: Dana Turner MRN: [id number] DOB: [birth year] Today's Date: 08/12/2022   History of present illness Dana Turner is a 56 y.o. female who presented to [facility] ED from home with complaints of unilateral left leg pain and bilateral leg weakness to the point that she mostly stayed in her wheelchair for a week.  No falls or trauma.  At baseline, the patient ambulates with a walker. PMH: fibromyalgia, urinary incontinence, MS (currently on q6 month IM Ocrevus), lupus, left eye blindness   OT comments  Pt slowly progressing towards established OT goals. Pt performing bed mobility with max A. Pt continues with LLE tightness with inability to extend at knee. Focusing session on mobility and decreasing tightness of LLE as precursor to participation in functional mobility/LB ADL. Pt following all basic commands. Engaging pt in deep breathing EOB for gravity assisted stretch of LLE as well as prolonged stretch in supine. Pt with pain throughout, but very pleasant and motivated. Continue to recommend SNF for OT services to optimize saety and independence in ALD and IADL.    Recommendations for follow up therapy are one component of a multi-disciplinary discharge planning process, led by the attending physician.  Recommendations may be updated based on patient status, additional functional criteria and insurance authorization.    Follow Up Recommendations  Skilled nursing-short term rehab (<3 hours/day)     Assistance Recommended at Discharge Intermittent Supervision/Assistance  Patient can return home with the following  Two people to help with walking and/or transfers;Two people to help with bathing/dressing/bathroom;Assistance with cooking/housework;Assist for transportation;Help with stairs or ramp for entrance   Equipment Recommendations  None recommended by OT    Recommendations for Other Services      Precautions / Restrictions  Precautions Precautions: Fall Restrictions Weight Bearing Restrictions: No       Mobility Bed Mobility Overal bed mobility: Needs Assistance Bed Mobility: Rolling, Supine to Sit, Sit to Supine Rolling: Max assist   Supine to sit: Max assist, +2 for physical assistance Sit to supine: Max assist, +2 for physical assistance   General bed mobility comments: Pt following commands during bed moblity to participate with trunk/BUE; extra time and is unable to extend the L hip/Knee with A/ROM,    Transfers                   General transfer comment: [place] to transfer at this time due to inability to extend LLE and greater tightness with attempt to extend RLE     Balance Overall balance assessment: Needs assistance Sitting-balance support: Bilateral upper extremity supported Sitting balance-Leahy Scale: Poor Sitting balance - Comments: Intermittent Max A especially with initially sitting, progressed to CGA with extension at the trunk and flexion of the LLE throughout Postural control: Posterior lean     Standing balance comment: Unable to progress to standing                           ADL either performed or assessed with clinical judgement   ADL Overall ADL's : Needs assistance/impaired Eating/Feeding: Bed level;Set up Eating/Feeding Details (indicate cue type and reason): eating at bed level at end of session                 Lower Body Dressing: Total assistance;Bed level Lower Body Dressing Details (indicate cue type and reason): to don socks             Functional mobility during ADLs:  Total assistance      Extremity/Trunk Assessment Upper Extremity Assessment Upper Extremity Assessment: Generalized weakness   Lower Extremity Assessment Lower Extremity Assessment: RLE deficits/detail;LLE deficits/detail RLE Deficits / Details: Pt maintains flexion and was only able to extend with assist to lacking full extenstion by 10 degrees, very little  active movement noted LLE Deficits / Details: Pt maintains flexed knee to ~75 degrees` after gravity assist and OT facilitation, reaching extension to ~120 degrees and maintaining after return to supine for ~8 minutes; returning to check on pt and pt reports contracting back to position upon return (90) when she was reaching to her bedside table with her LUE        Vision   Vision Assessment?: Vision impaired- to be further tested in functional context Additional Comments: visual deficits at baseline   Perception     Praxis      Cognition Arousal/Alertness: Awake/alert Behavior During Therapy: Anxious Overall Cognitive Status: Difficult to assess                                 General Comments: Pt states that she was able to get around her house and get from bed to W/C. Pt now has a contracted LLE and has significant difficulty sitting EOB.        Exercises Exercises: Other exercises Other Exercises Other Exercises: Prolonged gentle stretch of LLE Other Exercises: gravity assisted stretch of LLE. Other Exercises: truncal lateral stretch with R lean to decrease tightness in musculature on L Other Exercises: gentle therapeutic massage to lateral abdominals on L    Shoulder Instructions       General Comments HR up to 130 max for short period after achieving EOB. Engaging pt in deep breathing techniques and HR stabilizing in 90s    Pertinent Vitals/ Pain       Pain Assessment Pain Assessment: Faces Faces Pain Scale: Hurts worst Pain Location: BLE Pain Descriptors / Indicators: Discomfort, Grimacing, Guarding Pain Intervention(s): Limited activity within patient's tolerance, Monitored during session, Patient requesting pain meds-RN notified, Repositioned, Relaxation  Home Living Family/patient expects to be discharged to:: Private residence Living Arrangements: Other relatives Available Help at Discharge: Family;Available PRN/intermittently (sister works M-F  8-5) Type of Home: House Home Access: Stairs to enter [employer] of Steps: 4 Entrance Stairs-Rails: Right;Left Home Layout: Two level;Bed/bath upstairs Alternate Level Stairs-Number of Steps: flight Alternate Level Stairs-Rails: Right;Left Bathroom Shower/Tub: Chief Strategy Officer: Standard     Home Equipment: Rollator (4 wheels);Tub bench;Wheelchair - manual;BSC/3in1   Additional Comments: 2 walkers, one upstairs and one downstairs      Prior Functioning/Environment   Per pt, pt was mod I for functional mobility PTA until 2-3 weeks ago, when mobility became more difficult. Sister assists with bathing at baseline.            Frequency  Min 2X/week        Progress Toward Goals  OT Goals(current goals can now be found in the care plan section)  Progress towards OT goals: Progressing toward goals (slowly)  Acute Rehab OT Goals Patient Stated Goal: get better OT Goal Formulation: With patient Time For Goal Achievement: 08/19/22 Potential to Achieve Goals: Good ADL Goals Pt Will Perform Grooming: sitting;with min guard assist Pt Will Perform Upper Body Dressing: with min guard assist;sitting Additional ADL Goal #1: Pt will perform bed mobility with mod A in preparation for participation in ADL Additional  ADL Goal #2: Pt will maintain static sititng EOB with min guard A for 5 minutes in prepartation for participation in ADL  Plan Discharge plan remains appropriate;Frequency remains appropriate    Co-evaluation                 AM-PAC OT "6 Clicks" Daily Activity     Outcome Measure   Help from another person eating meals?: A Little Help from another person taking care of personal grooming?: A Little Help from another person toileting, which includes using toliet, bedpan, or urinal?: Total Help from another person bathing (including washing, rinsing, drying)?: A Lot Help from another person to put on and taking off regular upper body  clothing?: A Lot Help from another person to put on and taking off regular lower body clothing?: Total 6 Click Score: 12    End of Session    OT Visit Diagnosis: Muscle weakness (generalized) (M62.81);Pain;Unsteadiness on feet (R26.81) Pain - Right/Left: Left Pain - part of body:  (LLE)   Activity Tolerance Patient tolerated treatment well;Patient limited by pain   Patient Left in bed;with call bell/phone within reach;with bed alarm set   Nurse Communication Mobility status;Patient requests pain meds        Time: 5852-7782 OT Time Calculation (min): 42 min  Charges: OT General Charges $OT Visit: 1 Visit OT Treatments $Self Care/Home Management : 8-22 mins $Therapeutic Exercise: 23-37 mins Tyler Deis, OTR/L [facility] Acute Rehabilitation Office: [phone]   Myrla Halsted 08/12/2022, 3:44 PM

## 2022-08-12 NOTE — Plan of Care (Signed)
  Problem: Activity: Goal: Risk for activity intolerance will decrease Outcome: Progressing   Problem: Nutrition: Goal: Adequate nutrition will be maintained Outcome: Progressing   Problem: Coping: Goal: Level of anxiety will decrease Outcome: Progressing   Problem: Elimination: Goal: Will not experience complications related to bowel motility Outcome: Progressing   

## 2022-08-12 NOTE — Progress Notes (Signed)
PROGRESS NOTE    Dana Turner  YIR:[id number] DOB: [birth year] DOA: 08/03/2022 PCP: Georgina Quint, MD   Brief Narrative:  This 56 years old female with PMH significant for fibromyalgia, urinary retention, MS(currently on q6 month IM Ocrevus), lupus, left eye blindness presented in the ED from home with complaints of unilateral left leg pain and bilateral leg weakness to the point where she mostly stayed in her wheelchair for a week.  She denies any fall or trauma.  At baseline she ambulates with a walker. She also reports having burning urination.  ED physician has discussed the case with neurology who recommended MRI brain,  cervical and thoracic spine to rule out MS flare as the cause of her symptoms.  She has developed acute urinary retention requiring in and out cath.  Patient was empirically started on Rocephin for presumptive UTI. MRI's are unremarkable for evidence of acute MS flare.  No evidence of demyelination. She reports doing better but remains significantly weak.   She states that she normally ambulates with a walker but remains too weak and PT OT recommending SNF.  She is medically stable to be discharged to skilled nursing facility once insurance authorization has been obtained.  PT reevaluated and patient did have left leg contracture that likely developed due to deconditioning.  EEG pelvis and hip x-ray was nonacute.  PT OT still recommending SNF.  Overall she is improved and repeat labs in the morning.  Her SNF stay was denied by her insurance company due to lack of updated physical therapy notes that PT evaluated today.  Appeal process has been initiated by the [facility] team.  She currently remains medically stable for discharge  Assessment and Plan:  B/L LE weakness and Left LE unilateral pain but could be a pseudo flare of MS from the urinary tract infection: Left hip fracture with concern for fracture -Etiology unclear.  Could be due to UTI. -MRI brain, cervical and  thoracic spine unremarkable for evidence of acute MS flare.  No evidence of demyelination. -Continue gabapentin. -B/L LE  venous duplex negative for DVT. -Continue adequate pain control and bowel regimen. -PT and OT recommended skilled nursing facility. -Continue with therapy and DG hip and pelvis x-ray done and showed "There is no evidence of acute fracture. There is mild left hip degenerative change. The left hip is held in flexion." -Will try baclofen as well as aqua thermia for her left hip flexion and possible contracture.  PT OT to further evaluate and treat  -Fall precautions.   E. coli UTI POA: -Patient reports burning urination for the last 3 days. -Urine culture grew E. Coli pan-sensitive. -Initiated on ceftriaxone then changed to [facility] and is now improving   Acute kidney injury: > Resolved. -Likely prerenal in the setting of dehydration from poor p.o. intake -Baseline creatinine 0.74, presented with creatinine 1.45. -Improved with IV fluid hydration.  Patient's BUNs/creatinine is now 15/0.63 on last check -Avoid further nephrotoxic medications, contrast dyes, hypotension and dehydration to ensure adequate renal perfusion -Repeat CMP in the a.m if still hospitalized .   Acute urinary retention -Has been getting In-N-Out caths will need to replace Foley catheter and have outpatient urological follow-up; Foley catheter was replaced   Hyponatremia -Mild -Patient's sodium went from 137 is now 134 -Continue to monitor and trend and repeat CMP in a.m.   Microcytic Anemia -Patient's Hgb/Hct has gone from 12.8/39.1 -> 11.9/37.0 -> 11.3/34.0 -> 11.6/35.8 on last check -Check Anemia Panel in the AM  -Continue  to [place] for S/Sx of Bleeding; No overt Bleeding noted -Repeat CBC in the AM if still hospitalized    Anion gap metabolic acidosis in setting of AKI, improved  -Serum bicarb 17, anion gap 11 initially and is now improved; 2 is now 26, anion gap is 8, chloride level is  100 on last check -Resolved with IV hydration. -Continue to monitor and trend and repeat CMP in the a.m.   Isolated elevated AST: -Suspect from alcohol use. -Continue to Monitor for now and AST not obtained by can repeat in the a.m.   Elevated CPK, Mild Rhabdomyolysis  -CPK 614> 3244>0102 > 682 >440> 349 -Patient denies any trauma or fall. -Continue IV hydration. Continue to Hold Atorvastatin. -CPK level improving and repeat as an outpatient    Hypophosphatemia: -Phos Level is now 4.2 on last check -Continue to Monitor and Trend -Repeat Phos Level in the AM   Hypomagnesemia: -Mag Level was 2.1   Hypokalemia: -Patient's K+ is now 3.8 on last check and repeat in the morning -Continue to Monitor and Trend and repeat CMP in the AM    Bilateral Knee Pain / Fibromyalgia: -Could be due to osteoarthritis, fibromyalgia. -Continue Percocet 5 mg every 8 hours as needed.   Multiple Sclerosis -Continue Q6 month IM ocrevus. -Follow up with Neurology in the outpatient setting as she had no active Demyelination on MRI's  DVT prophylaxis: enoxaparin (LOVENOX) injection 40 mg Start: 08/04/22 1000    Code Status: Full Code Family Communication: No family currently at bedside  Disposition Plan:  Level of care: Telemetry Medical Status is: Inpatient Remains inpatient appropriate because: Needs SNF and insurance authorization   Consultants:  None  Procedures:  As delineated as above  Antimicrobials:  Anti-infectives (From admission, onward)    Start     Dose/Rate Route Frequency Ordered Stop   08/11/22 0000  cefadroxil (DURICEF) 500 MG capsule        500 mg Oral 2 times daily 08/11/22 1346 08/14/22 2359   08/07/22 1000  cefadroxil (DURICEF) capsule 500 mg        500 mg Oral 2 times daily 08/06/22 1337     08/04/22 0600  cefTRIAXone (ROCEPHIN) 2 g in sodium chloride 0.9 % 100 mL IVPB  Status:  Discontinued        2 g 200 mL/hr over 30 Minutes Intravenous Daily 08/04/22 0453  08/06/22 1337   08/04/22 0430  cefTRIAXone (ROCEPHIN) 1 g in sodium chloride 0.9 % 100 mL IVPB  Status:  Discontinued        1 g 200 mL/hr over 30 Minutes Intravenous  Once 08/04/22 0417 08/04/22 0505       Subjective: Seen and examined at bedside and she is doing fairly well.  Had no complaints.  Denies any nausea or vomiting and states that she felt better.  Objective: Vitals:   08/12/22 0508 08/12/22 0822 08/12/22 1145 08/12/22 1548  BP: (!) 153/89     Pulse:  79 74 78  Resp:      Temp:  98.4 F (36.9 C) 98.3 F (36.8 C) 98.5 F (36.9 C)  TempSrc:  Oral Oral Oral  SpO2:  99%  99%  Weight:      Height:        Intake/Output Summary (Last 24 hours) at 08/12/2022 1850 Last data filed at 08/12/2022 0600 Gross per 24 hour  Intake 240 ml  Output 450 ml  Net -210 ml   Filed Weights   08/05/22 0250  Weight: 49.8 kg   Examination: Physical Exam:  Constitutional: Thin chronically ill-appearing African-American female currently no acute distress eating breakfast Respiratory: Diminished to auscultation bilaterally, no wheezing, rales, rhonchi or crackles. Normal respiratory effort and patient is not tachypenic. No accessory muscle use.  Unlabored breathing Cardiovascular: RRR, no murmurs / rubs / gallops. S1 and S2 auscultated.   Abdomen: Soft, non-tender, non-distended.. Bowel sounds positive.  GU: Deferred. Musculoskeletal: No clubbing / cyanosis of digits/nails.  She has her left hip flexed Skin: No rashes, lesions, ulcers on limited skin evaluation. No induration; Warm and dry.  Neurologic: CN 2-12 grossly intact with no focal deficits. Romberg sign cerebellar reflexes not assessed.  Psychiatric: Normal judgment and insight. Alert and oriented x 3. Normal mood and appropriate affect.   Data Reviewed: I have personally reviewed following labs and imaging studies  CBC: Recent Labs  Lab 08/08/22 0257 08/11/22 0428  WBC 4.7 4.7  HGB 11.3* 11.6*  HCT 34.0* 35.8*  MCV  77.4* 79.0*  PLT 255 323   Basic Metabolic Panel: Recent Labs  Lab 08/06/22 0354 08/07/22 0313 08/08/22 0257 08/09/22 0349 08/11/22 0428  NA  --   --  137  --  134*  K  --   --  3.4* 3.9 3.8  CL  --   --  103  --  100  CO2  --   --  24  --  26  GLUCOSE  --   --  108*  --  109*  BUN  --   --  10  --  15  CREATININE  --   --  0.63  --  0.63  CALCIUM  --   --  8.8*  --  9.0  MG 1.6* 1.9  --   --  2.1  PHOS 3.0 3.2  --   --  4.2   GFR: Estimated Creatinine Clearance: 62.5 mL/min (by C-G formula based on SCr of 0.63 mg/dL). Liver Function Tests: No results for input(s): "AST", "ALT", "ALKPHOS", "BILITOT", "PROT", "ALBUMIN" in the last 168 hours. No results for input(s): "LIPASE", "AMYLASE" in the last 168 hours. No results for input(s): "AMMONIA" in the last 168 hours. Coagulation Profile: No results for input(s): "INR", "PROTIME" in the last 168 hours. Cardiac Enzymes: Recent Labs  Lab 08/06/22 0354 08/07/22 0313 08/08/22 0257 08/09/22 0349  CKTOTAL 1,426* 682* 440* 349*   BNP (last 3 results) No results for input(s): "PROBNP" in the last 8760 hours. HbA1C: No results for input(s): "HGBA1C" in the last 72 hours. CBG: No results for input(s): "GLUCAP" in the last 168 hours. Lipid Profile: No results for input(s): "CHOL", "HDL", "LDLCALC", "TRIG", "CHOLHDL", "LDLDIRECT" in the last 72 hours. Thyroid Function Tests: No results for input(s): "TSH", "T4TOTAL", "FREET4", "T3FREE", "THYROIDAB" in the last 72 hours. Anemia Panel: No results for input(s): "VITAMINB12", "FOLATE", "FERRITIN", "TIBC", "IRON", "RETICCTPCT" in the last 72 hours. Sepsis Labs: No results for input(s): "PROCALCITON", "LATICACIDVEN" in the last 168 hours.  Recent Results (from the past 240 hour(s))  Urine Culture     Status: Abnormal   Collection Time: 08/04/22  4:53 AM   Specimen: Urine, Clean Catch  Result Value Ref Range Status   Specimen Description URINE, CLEAN CATCH  Final   Special  Requests   Final    NONE Performed at [facility], [address] [place], [place] [address]    Culture >=100,000 COLONIES/mL ESCHERICHIA COLI (A)  Final   Report Status 08/06/2022 FINAL  Final  Organism ID, Bacteria ESCHERICHIA COLI (A)  Final      Susceptibility   Escherichia coli - MIC*    AMPICILLIN <=2 SENSITIVE Sensitive     CEFAZOLIN <=4 SENSITIVE Sensitive     CEFEPIME <=0.12 SENSITIVE Sensitive     CEFTRIAXONE <=0.25 SENSITIVE Sensitive     CIPROFLOXACIN <=0.25 SENSITIVE Sensitive     GENTAMICIN <=1 SENSITIVE Sensitive     IMIPENEM <=0.25 SENSITIVE Sensitive     NITROFURANTOIN <=16 SENSITIVE Sensitive     TRIMETH/SULFA <=20 SENSITIVE Sensitive     AMPICILLIN/SULBACTAM <=2 SENSITIVE Sensitive     PIP/TAZO <=4 SENSITIVE Sensitive     * >=100,000 COLONIES/mL ESCHERICHIA COLI    Radiology Studies: DG HIP UNILAT WITH PELVIS 2-3 VIEWS LEFT  Result Date: 08/12/2022 CLINICAL DATA:  Pain EXAM: DG HIP (WITH OR WITHOUT PELVIS) 2-3V LEFT COMPARISON:  None Available. FINDINGS: There is no evidence of acute fracture. There is mild left hip degenerative change. The left hip is held in flexion. IMPRESSION: No acute osseous abnormality.  Mild left hip osteoarthritis. Left hip is held in flexion. Electronically Signed   By: Caprice Renshaw M.D.   On: 08/12/2022 14:09     Scheduled Meds:  amLODipine  5 mg Oral Daily   cefadroxil  500 mg Oral BID   enoxaparin (LOVENOX) injection  40 mg Subcutaneous Daily   ferrous sulfate  325 mg Oral BID WC   gabapentin  300 mg Oral TID   hydrALAZINE  50 mg Oral Q8H   lidocaine  1 Application Urethral Once   Continuous Infusions:  methocarbamol (ROBAXIN) IV      LOS: 8 days   Marguerita Merles, DO [facility] Available via Epic secure chat 7am-7pm After these hours, please refer to coverage provider listed on [email or web address] 08/12/2022, 6:50 PM

## 2022-08-12 NOTE — Care Management Important Message (Signed)
Important Message  Patient Details  Name: Dana Turner MRN: [id number] Date of Birth: 15-Mar-1966   Medicare Important Message Given:  Yes     Zaki Gertsch Stefan Church 08/12/2022, 3:32 PM

## 2022-08-12 NOTE — Progress Notes (Signed)
Foley catheter was placed with urine output.

## 2022-08-13 ENCOUNTER — Inpatient Hospital Stay (HOSPITAL_COMMUNITY): Payer: Medicare HMO

## 2022-08-13 DIAGNOSIS — R7989 Other specified abnormal findings of blood chemistry: Secondary | ICD-10-CM

## 2022-08-13 LAB — CBC WITH DIFFERENTIAL/PLATELET
Abs Immature Granulocytes: 0.01 10*3/uL (ref 0.00–0.07)
Basophils Absolute: 0 10*3/uL (ref 0.0–0.1)
Basophils Relative: 1 %
Eosinophils Absolute: 0.1 10*3/uL (ref 0.0–0.5)
Eosinophils Relative: 2 %
HCT: 34.1 % — ABNORMAL LOW (ref 36.0–46.0)
Hemoglobin: 11.2 g/dL — ABNORMAL LOW (ref 12.0–15.0)
Immature Granulocytes: 0 %
Lymphocytes Relative: 33 %
Lymphs Abs: 1.4 10*3/uL (ref 0.7–4.0)
MCH: 26.1 pg (ref 26.0–34.0)
MCHC: 32.8 g/dL (ref 30.0–36.0)
MCV: 79.5 fL — ABNORMAL LOW (ref 80.0–100.0)
Monocytes Absolute: 0.6 10*3/uL (ref 0.1–1.0)
Monocytes Relative: 13 %
Neutro Abs: 2.3 10*3/uL (ref 1.7–7.7)
Neutrophils Relative %: 51 %
Platelets: 292 10*3/uL (ref 150–400)
RBC: 4.29 MIL/uL (ref 3.87–5.11)
RDW: 14 % (ref 11.5–15.5)
WBC: 4.4 10*3/uL (ref 4.0–10.5)
nRBC: 0.5 % — ABNORMAL HIGH (ref 0.0–0.2)

## 2022-08-13 LAB — COMPREHENSIVE METABOLIC PANEL
ALT: 75 U/L — ABNORMAL HIGH (ref 0–44)
AST: 65 U/L — ABNORMAL HIGH (ref 15–41)
Albumin: 2.6 g/dL — ABNORMAL LOW (ref 3.5–5.0)
Alkaline Phosphatase: 61 U/L (ref 38–126)
Anion gap: 9 (ref 5–15)
BUN: 9 mg/dL (ref 6–20)
CO2: 25 mmol/L (ref 22–32)
Calcium: 9 mg/dL (ref 8.9–10.3)
Chloride: 102 mmol/L (ref 98–111)
Creatinine, Ser: 0.77 mg/dL (ref 0.44–1.00)
GFR, Estimated: >60 mL/min (ref >60)
Glucose, Bld: 102 mg/dL — ABNORMAL HIGH (ref 70–99)
Potassium: 3.5 mmol/L (ref 3.5–5.1)
Sodium: 136 mmol/L (ref 135–145)
Total Bilirubin: 0.3 mg/dL (ref 0.3–1.2)
Total Protein: 6.2 g/dL — ABNORMAL LOW (ref 6.5–8.1)

## 2022-08-13 LAB — CK: Total CK: 308 U/L — ABNORMAL HIGH (ref 38–234)

## 2022-08-13 LAB — PHOSPHORUS: Phosphorus: 3.7 mg/dL (ref 2.5–4.6)

## 2022-08-13 LAB — MAGNESIUM: Magnesium: 2 mg/dL (ref 1.7–2.4)

## 2022-08-13 NOTE — Plan of Care (Signed)

## 2022-08-13 NOTE — Progress Notes (Signed)
Physical Therapy Treatment Patient Details Name: Dana Turner MRN: [id number] DOB: 1965/10/26 Today's Date: 08/13/2022   History of Present Illness Rainbow Salman is a 56 y.o. female who presented to [facility] ED from home with complaints of unilateral left leg pain and bilateral leg weakness to the point that she mostly stayed in her wheelchair for a week.  No falls or trauma.  At baseline, the patient ambulates with a walker. PMH: fibromyalgia, urinary incontinence, MS (currently on q6 month IM Ocrevus), lupus, left eye blindness    PT Comments    Pt's LLE continues to be rigidly held in Hip and knee flexion, and pt heavily guarding due to pain during stretching. Pt continues to require max +2 for bed mobility and attempted transfer into standing, pt very fearful of falling and cannot keep LE's on ground during hip rise. SNF remains appropriate plan, will continue to follow.      Recommendations for follow up therapy are one component of a multi-disciplinary discharge planning process, led by the attending physician.  Recommendations may be updated based on patient status, additional functional criteria and insurance authorization.  Follow Up Recommendations  Skilled nursing-short term rehab (<3 hours/day) Can patient physically be transported by private vehicle: No   Assistance Recommended at Discharge Frequent or constant Supervision/Assistance  Patient can return home with the following A lot of help with walking and/or transfers;Assistance with cooking/housework;Assist for transportation;Help with stairs or ramp for entrance   Equipment Recommendations  Other (comment) (defer to post acute at this time pt will benefit from recline W/C)    Recommendations for Other Services       Precautions / Restrictions Precautions Precautions: Fall Restrictions Weight Bearing Restrictions: No     Mobility  Bed Mobility Overal bed mobility: Needs Assistance Bed Mobility: Rolling, Supine to  Sit, Sit to Supine Rolling: Max assist, +2 for safety/equipment   Supine to sit: Max assist, +2 for physical assistance Sit to supine: Max assist, +2 for physical assistance   General bed mobility comments: assist for trunk and LE management, rolling bilat for placement of new pad.    Transfers Overall transfer level: Needs assistance Equipment used: 2 person hand held assist Transfers: Sit to/from Stand Sit to Stand: Total assist, +2 physical assistance           General transfer comment: total +2 for rise to semi-standing, only just cleared buttocks due to inability to extend LLE and pt resistance. attempt x2. Also attempted scooting EOB but pt with heavy posterior truncal bias and hips sliding anteriorly off bed requiring return to supine    Ambulation/Gait                   Stairs             Wheelchair Mobility    Modified Rankin (Stroke Patients Only)       Balance Overall balance assessment: Needs assistance Sitting-balance support: Bilateral upper extremity supported Sitting balance-Leahy Scale: Poor Sitting balance - Comments: heavy posterior bias with hips scooting forward off bed requiring PT intervention to correct, pt supporting self with RUE on footboard railing Postural control: Posterior lean   Standing balance-Leahy Scale: Zero Standing balance comment: Unable to progress to standing                            Cognition Arousal/Alertness: Awake/alert Behavior During Therapy: Anxious Overall Cognitive Status: Difficult to assess  General Comments: pt states her LLE is newly in hip and knee flexion, but appears chronic so unsure of accuracy        Exercises Other Exercises Other Exercises: hip and knee extension stretch 3x1 min throughout session    General Comments        Pertinent Vitals/Pain Pain Assessment Pain Assessment: Faces Faces Pain Scale: Hurts even  more Pain Location: BLE L>R espcially with PROM into hip and knee extension Pain Descriptors / Indicators: Discomfort, Grimacing, Guarding Pain Intervention(s): Monitored during session, Limited activity within patient's tolerance, Repositioned    Home Living                          Prior Function            PT Goals (current goals can now be found in the care plan section) Acute Rehab PT Goals Patient Stated Goal: to get home and see christmas tree and get manicure PT Goal Formulation: With patient Time For Goal Achievement: 08/18/22 Potential to Achieve Goals: Good Progress towards PT goals: Progressing toward goals    Frequency    Min 2X/week      PT Plan Current plan remains appropriate    Co-evaluation              AM-PAC PT "6 Clicks" Mobility   Outcome Measure  Help needed turning from your back to your side while in a flat bed without using bedrails?: A Lot Help needed moving from lying on your back to sitting on the side of a flat bed without using bedrails?: A Lot Help needed moving to and from a bed to a chair (including a wheelchair)?: Total Help needed standing up from a chair using your arms (e.g., wheelchair or bedside chair)?: Total Help needed to walk in hospital room?: Total Help needed climbing 3-5 steps with a railing? : Total 6 Click Score: 8    End of Session Equipment Utilized During Treatment: Gait belt Activity Tolerance: Patient limited by pain Patient left: with call bell/phone within reach;in bed;with bed alarm set Nurse Communication: Mobility status PT Visit Diagnosis: Muscle weakness (generalized) (M62.81);Pain Pain - part of body: Hip (L hip/pelvis)     Time: 6063-0160 PT Time Calculation (min) (ACUTE ONLY): 18 min  Charges:  $Therapeutic Activity: 8-22 mins                    Marye Round, PT DPT Acute Rehabilitation Services Pager [phone]  Office [phone]    Miking Usrey E Christain [place] 08/13/2022, 9:03 AM

## 2022-08-13 NOTE — TOC Progression Note (Signed)
Transition of Care [facility]) - Progression Note    Patient Details  Name: Dana Turner MRN: [id number] Date of Birth: October 22, 1965  Transition of [facility]) CM/SW Contact  [name],  Phone Number: 08/13/2022, 10:55 AM  Clinical Narrative:    CSW consulted with Kia at [facility] regarding denial and appeal. Kia reports hospital/pt needs to do appeal. CSW called [employer] and they faxed over the denial letter. CSW met with patient for her to complete/sign a representative form. CSW scanned in and completed appeal for SNF on line at [facility].    Expected Discharge Plan: [place] Barriers to Discharge: Continued Medical Work up  Expected Discharge Plan and Services Expected Discharge Plan: [place]         Expected Discharge Date: 08/11/22                                     Social Determinants of Health (SDOH) Interventions    Readmission Risk Interventions     No data to display

## 2022-08-13 NOTE — Progress Notes (Signed)
PROGRESS NOTE    Dana Turner  [id number] DOB: 16-Jul-1966 DOA: 08/03/2022 PCP: Horald Pollen, MD   Brief Narrative:  This 56 years old female with PMH significant for fibromyalgia, urinary retention, MS(currently on q6 month IM Ocrevus), lupus, left eye blindness presented in the ED from home with complaints of unilateral left leg pain and bilateral leg weakness to the point where she mostly stayed in her wheelchair for a week.  She denies any fall or trauma.  At baseline she ambulates with a walker. She also reports having burning urination.  ED physician has discussed the case with neurology who recommended MRI brain,  cervical and thoracic spine to rule out MS flare as the cause of her symptoms.  She has developed acute urinary retention requiring in and out cath.  Patient was empirically started on Rocephin for presumptive UTI. MRI's are unremarkable for evidence of acute MS flare.  No evidence of demyelination. She reports doing better but remains significantly weak.   She states that she normally ambulates with a walker but remains too weak and PT OT recommending SNF.  She is medically stable to be discharged to skilled nursing facility once insurance authorization has been obtained.   PT reevaluated and patient did have left leg contracture that likely developed due to deconditioning.  EEG pelvis and hip x-ray was nonacute.  PT OT still recommending SNF.  Overall she is improved and repeat labs in the morning.  Her SNF stay was denied by her insurance company due to lack of updated physical therapy notes that PT evaluated today.  Appeal process has been initiated by the [facility] team this was done last night and  awaiting disposition.  She currently remains medically stable for discharge  Assessment and Plan:  B/L LE weakness and Left LE unilateral pain but could be a pseudo flare of MS from the urinary tract infection: Left hip fracture with concern for fracture -Etiology unclear.   Could be due to UTI. -MRI brain, cervical and thoracic spine unremarkable for evidence of acute MS flare.  No evidence of demyelination. -Continue gabapentin. -B/L LE  venous duplex negative for DVT. -Continue adequate pain control and bowel regimen. -PT and OT recommended skilled nursing facility. -Continue with therapy and DG hip and pelvis x-ray done and showed "There is no evidence of acute fracture. There is mild left hip degenerative change. The left hip is held in flexion." -Will try baclofen as well as aqua thermia for her left hip flexion and possible contracture.  PT OT to further evaluate and treat and recommending SNF -Fall precautions.   E. coli UTI POA: -Patient reports burning urination for the last 3 days. -Urine culture grew E. Coli pan-sensitive. -Initiated on ceftriaxone then changed to [facility] and is now improving   Acute kidney injury: > Resolved. -Likely prerenal in the setting of dehydration from poor p.o. intake -Baseline creatinine 0.74, presented with creatinine 1.45. -Improved with IV fluid hydration.  Patient's BUNs/creatinine is now 9/0.77 on last check -Avoid further nephrotoxic medications, contrast dyes, hypotension and dehydration to ensure adequate renal perfusion -Repeat CMP in the a.m if still hospitalized .   Acute urinary retention -Has been getting In-N-Out caths will need to replace Foley catheter and have outpatient urological follow-up; Foley catheter was replaced   Hyponatremia -Mild -Patient's sodium went from 137 -> 134 -> 136 -Continue to monitor and trend and repeat CMP in a.m.   Microcytic Anemia -Patient's Hgb/Hct has gone from 12.8/39.1 -> 11.9/37.0 -> 11.3/34.0 ->  11.6/35.8 -> 11.2/34.1 with an MCV of 79.5 on last check -Check Anemia Panel in the AM  -Continue to [place] for S/Sx of Bleeding; No overt Bleeding noted -Repeat CBC in the AM if still hospitalized    Anion gap metabolic acidosis in setting of AKI, improved  -Serum  bicarb 17, anion gap 11 initially and is now improved; CO2 is now 25, anion gap is 9, chloride level is 102 on last check -Resolved with IV hydration and now stopped  -Continue to monitor and trend and repeat CMP in the a.m.   Abnormal LFTs -Suspect from alcohol use. -Continue to Monitor for now and AST was 83 and now is 65 -ALT is now 75 -Continue to Monitor Intermittently and mildly elevated -Obtain U/S of the RUQ and Acute Hepatitis Panel but if discharged prior to being obtained it can be done in the outpatient setting    Elevated CPK, Mild Rhabdomyolysis  -CPK 614> 2701>1426 > 682 >440> 349 -> 308 -Patient denies any trauma or fall. -IV hydration now stopped. Continue to Hold Atorvastatin. -CPK level improving and repeat as an outpatient    Hypophosphatemia: -Phos Level is now 3.7 on last check -Continue to Monitor and Trend -Repeat Phos Level intermittently    Hypomagnesemia: -Mag Level was 2.0 on last check -Repeat Mag Level intermittently    Hypokalemia: -Patient's K+ is now 3.8 on last check and repeat in the morning -Continue to Monitor and Trend and repeat CMP in the AM    Bilateral Knee Pain / Fibromyalgia: -Could be due to osteoarthritis, fibromyalgia. -Continue Percocet 5 mg every 8 hours as needed.  Hypoalbuminemia -Patient's Albumin Level has gone from 2.7 -> 2.6 -Continue to Monitor and Trend and repeat Intermittently    Multiple Sclerosis -Continue Q6 month IM ocrevus. -Follow up with Neurology in the outpatient setting as she had no active Demyelination on MRI's  DVT prophylaxis: enoxaparin (LOVENOX) injection 40 mg Start: 08/04/22 1000    Code Status: Full Code Family Communication: No family present at bedside   Disposition Plan:  Level of care: Telemetry Medical Status is: Inpatient Remains inpatient appropriate because: Needs SNF and Insurance Auth was denied but being appealed    Consultants:  None  Procedures:  As delineated as above    Antimicrobials:  Anti-infectives (From admission, onward)    Start     Dose/Rate Route Frequency Ordered Stop   08/11/22 0000  cefadroxil (DURICEF) 500 MG capsule        500 mg Oral 2 times daily 08/11/22 1346 08/14/22 2359   08/07/22 1000  cefadroxil (DURICEF) capsule 500 mg        500 mg Oral 2 times daily 08/06/22 1337     08/04/22 0600  cefTRIAXone (ROCEPHIN) 2 g in sodium chloride 0.9 % 100 mL IVPB  Status:  Discontinued        2 g 200 mL/hr over 30 Minutes Intravenous Daily 08/04/22 0453 08/06/22 1337   08/04/22 0430  cefTRIAXone (ROCEPHIN) 1 g in sodium chloride 0.9 % 100 mL IVPB  Status:  Discontinued        1 g 200 mL/hr over 30 Minutes Intravenous  Once 08/04/22 0417 08/04/22 0505       Subjective: Seen and examined at bedside she states that she is feeling better today.  States that she actually got some sleep last night.  No nausea or vomiting.  Denies any lightheadedness or dizziness.  No other concerns or complaints at this time.  Objective: Vitals:  08/13/22 0030 08/13/22 0401 08/13/22 0748 08/13/22 1244  BP: 124/72 126/85    Pulse: 82 77 62 66  Resp: 18 18    Temp: 97.7 F (36.5 C) 97.8 F (36.6 C) 98.2 F (36.8 C) 98.3 F (36.8 C)  TempSrc: Axillary Axillary Oral Oral  SpO2: 99% 100% 99% 99%  Weight:      Height:        Intake/Output Summary (Last 24 hours) at 08/13/2022 1349 Last data filed at 08/13/2022 0600 Gross per 24 hour  Intake 360 ml  Output 450 ml  Net -90 ml   Filed Weights   08/05/22 0250  Weight: 49.8 kg   Examination: Physical Exam:  Constitutional: Thin chronically ill-appearing African-American female, in no acute distress Respiratory: Diminished to auscultation bilaterally, no wheezing, rales, rhonchi or crackles. Normal respiratory effort and patient is not tachypenic. No accessory muscle use.  Unlabored breathing Cardiovascular: RRR, no murmurs / rubs / gallops. S1 and S2 auscultated. No extremity edema. .  Abdomen: Soft,  non-tender, non-distended. Bowel sounds positive.  GU: Deferred. Musculoskeletal: No clubbing / cyanosis of digits/nails.  Left hip is flexed Skin: No rashes, lesions, ulcers. No induration; Warm and dry.  Neurologic: CN 2-12 grossly intact with no focal deficits. Romberg sign and cerebellar reflexes not assessed.  Psychiatric: Normal judgment and insight. Alert and oriented x 3. Normal mood and appropriate affect.   Data Reviewed: I have personally reviewed following labs and imaging studies  CBC: Recent Labs  Lab 08/08/22 0257 08/11/22 0428 08/13/22 0839  WBC 4.7 4.7 4.4  NEUTROABS  --   --  2.3  HGB 11.3* 11.6* 11.2*  HCT 34.0* 35.8* 34.1*  MCV 77.4* 79.0* 79.5*  PLT 255 323 [id number]   Basic Metabolic Panel: Recent Labs  Lab 08/07/22 0313 08/08/22 0257 08/09/22 0349 08/11/22 0428 08/13/22 0839  NA  --  137  --  134* 136  K  --  3.4* 3.9 3.8 3.5  CL  --  103  --  100 102  CO2  --  24  --  26 25  GLUCOSE  --  108*  --  109* 102*  BUN  --  10  --  15 9  CREATININE  --  0.63  --  0.63 0.77  CALCIUM  --  8.8*  --  9.0 9.0  MG 1.9  --   --  2.1 2.0  PHOS 3.2  --   --  4.2 3.7   GFR: Estimated Creatinine Clearance: 62.5 mL/min (by C-G formula based on SCr of 0.77 mg/dL). Liver Function Tests: Recent Labs  Lab 08/13/22 0839  AST 65*  ALT 75*  ALKPHOS 61  BILITOT 0.3  PROT 6.2*  ALBUMIN 2.6*   No results for input(s): "LIPASE", "AMYLASE" in the last 168 hours. No results for input(s): "AMMONIA" in the last 168 hours. Coagulation Profile: No results for input(s): "INR", "PROTIME" in the last 168 hours. Cardiac Enzymes: Recent Labs  Lab 08/07/22 0313 08/08/22 0257 08/09/22 0349 08/13/22 0839  CKTOTAL 682* 440* 349* 308*   BNP (last 3 results) No results for input(s): "PROBNP" in the last 8760 hours. HbA1C: No results for input(s): "HGBA1C" in the last 72 hours. CBG: No results for input(s): "GLUCAP" in the last 168 hours. Lipid Profile: No results for  input(s): "CHOL", "HDL", "LDLCALC", "TRIG", "CHOLHDL", "LDLDIRECT" in the last 72 hours. Thyroid Function Tests: No results for input(s): "TSH", "T4TOTAL", "FREET4", "T3FREE", "THYROIDAB" in the last 72 hours. Anemia Panel: No  results for input(s): "VITAMINB12", "FOLATE", "FERRITIN", "TIBC", "IRON", "RETICCTPCT" in the last 72 hours. Sepsis Labs: No results for input(s): "PROCALCITON", "LATICACIDVEN" in the last 168 hours.  Recent Results (from the past 240 hour(s))  Urine Culture     Status: Abnormal   Collection Time: 08/04/22  4:53 AM   Specimen: Urine, Clean Catch  Result Value Ref Range Status   Specimen Description URINE, CLEAN CATCH  Final   Special Requests   Final    NONE Performed at [facility], [address] [place], [place] [address]    Culture >=100,000 COLONIES/mL ESCHERICHIA COLI (A)  Final   Report Status 08/06/2022 FINAL  Final   Organism ID, Bacteria ESCHERICHIA COLI (A)  Final      Susceptibility   Escherichia coli - MIC*    AMPICILLIN <=2 SENSITIVE Sensitive     CEFAZOLIN <=4 SENSITIVE Sensitive     CEFEPIME <=0.12 SENSITIVE Sensitive     CEFTRIAXONE <=0.25 SENSITIVE Sensitive     CIPROFLOXACIN <=0.25 SENSITIVE Sensitive     GENTAMICIN <=1 SENSITIVE Sensitive     IMIPENEM <=0.25 SENSITIVE Sensitive     NITROFURANTOIN <=16 SENSITIVE Sensitive     TRIMETH/SULFA <=20 SENSITIVE Sensitive     AMPICILLIN/SULBACTAM <=2 SENSITIVE Sensitive     PIP/TAZO <=4 SENSITIVE Sensitive     * >=100,000 COLONIES/mL ESCHERICHIA COLI    Radiology Studies: DG HIP UNILAT WITH PELVIS 2-3 VIEWS LEFT  Result Date: 08/12/2022 CLINICAL DATA:  Pain EXAM: DG HIP (WITH OR WITHOUT PELVIS) 2-3V LEFT COMPARISON:  None Available. FINDINGS: There is no evidence of acute fracture. There is mild left hip degenerative change. The left hip is held in flexion. IMPRESSION: No acute osseous abnormality.  Mild left hip osteoarthritis. Left hip is held in flexion. Electronically Signed   By:  Maurine Simmering M.D.   On: 08/12/2022 14:09    Scheduled Meds:  amLODipine  5 mg Oral Daily   cefadroxil  500 mg Oral BID   enoxaparin (LOVENOX) injection  40 mg Subcutaneous Daily   ferrous sulfate  325 mg Oral BID WC   gabapentin  300 mg Oral TID   hydrALAZINE  50 mg Oral Q8H   lidocaine  1 Application Urethral Once   Continuous Infusions:  methocarbamol (ROBAXIN) IV      LOS: 9 days   Raiford Noble, DO [facility] Available via Epic secure chat 7am-7pm After these hours, please refer to coverage provider listed on [email or web address] 08/13/2022, 1:49 PM

## 2022-08-14 LAB — COMPREHENSIVE METABOLIC PANEL
ALT: 77 U/L — ABNORMAL HIGH (ref 0–44)
AST: 61 U/L — ABNORMAL HIGH (ref 15–41)
Albumin: 2.9 g/dL — ABNORMAL LOW (ref 3.5–5.0)
Alkaline Phosphatase: 68 U/L (ref 38–126)
Anion gap: 8 (ref 5–15)
BUN: 11 mg/dL (ref 6–20)
CO2: 27 mmol/L (ref 22–32)
Calcium: 9.4 mg/dL (ref 8.9–10.3)
Chloride: 103 mmol/L (ref 98–111)
Creatinine, Ser: 0.68 mg/dL (ref 0.44–1.00)
GFR, Estimated: >60 mL/min (ref >60)
Glucose, Bld: 96 mg/dL (ref 70–99)
Potassium: 4.1 mmol/L (ref 3.5–5.1)
Sodium: 138 mmol/L (ref 135–145)
Total Bilirubin: 0.2 mg/dL — ABNORMAL LOW (ref 0.3–1.2)
Total Protein: 6.7 g/dL (ref 6.5–8.1)

## 2022-08-14 LAB — MAGNESIUM: Magnesium: 2.2 mg/dL (ref 1.7–2.4)

## 2022-08-14 LAB — HEPATITIS PANEL, ACUTE
HCV Ab: NONREACTIVE
Hep A IgM: NONREACTIVE
Hep B C IgM: NONREACTIVE
Hepatitis B Surface Ag: NONREACTIVE

## 2022-08-14 LAB — CBC WITH DIFFERENTIAL/PLATELET
Abs Immature Granulocytes: 0.01 10*3/uL (ref 0.00–0.07)
Basophils Absolute: 0 10*3/uL (ref 0.0–0.1)
Basophils Relative: 0 %
Eosinophils Absolute: 0.1 10*3/uL (ref 0.0–0.5)
Eosinophils Relative: 2 %
HCT: 37 % (ref 36.0–46.0)
Hemoglobin: 12.1 g/dL (ref 12.0–15.0)
Immature Granulocytes: 0 %
Lymphocytes Relative: 38 %
Lymphs Abs: 1.8 10*3/uL (ref 0.7–4.0)
MCH: 25.9 pg — ABNORMAL LOW (ref 26.0–34.0)
MCHC: 32.7 g/dL (ref 30.0–36.0)
MCV: 79.2 fL — ABNORMAL LOW (ref 80.0–100.0)
Monocytes Absolute: 0.6 10*3/uL (ref 0.1–1.0)
Monocytes Relative: 12 %
Neutro Abs: 2.2 10*3/uL (ref 1.7–7.7)
Neutrophils Relative %: 48 %
Platelets: 343 10*3/uL (ref 150–400)
RBC: 4.67 MIL/uL (ref 3.87–5.11)
RDW: 14.1 % (ref 11.5–15.5)
WBC: 4.7 10*3/uL (ref 4.0–10.5)
nRBC: 0 % (ref 0.0–0.2)

## 2022-08-14 LAB — PHOSPHORUS: Phosphorus: 4 mg/dL (ref 2.5–4.6)

## 2022-08-14 MED ORDER — CHLORHEXIDINE GLUCONATE CLOTH 2 % EX PADS
6.0000 | MEDICATED_PAD | Freq: Every day | CUTANEOUS | Status: DC
  Administered 2022-08-14 – 2022-08-16 (×3): 6 via TOPICAL

## 2022-08-14 NOTE — Plan of Care (Signed)

## 2022-08-14 NOTE — Progress Notes (Signed)
Pt has poor PO intake (BMI 18) and is requesting a regular diet, not heart healthy, as pt was desiring bacon and french toast. Paged MD-diet changed to regular.

## 2022-08-14 NOTE — Progress Notes (Signed)
PROGRESS NOTE    Dana Turner  THY:[id number] DOB: [birth year] DOA: 08/03/2022 PCP: Georgina Quint, MD   Brief Narrative:  This 56 years old female with PMH significant for fibromyalgia, urinary retention, MS(currently on q6 month IM Ocrevus), lupus, left eye blindness presented in the ED from home with complaints of unilateral left leg pain and bilateral leg weakness to the point where she mostly stayed in her wheelchair for a week.  She denies any fall or trauma.  At baseline she ambulates with a walker. She also reports having burning urination.  ED physician has discussed the case with neurology who recommended MRI brain,  cervical and thoracic spine to rule out MS flare as the cause of her symptoms.  She has developed acute urinary retention requiring in and out cath.  Patient was empirically started on Rocephin for presumptive UTI. MRI's are unremarkable for evidence of acute MS flare.  No evidence of demyelination. She reports doing better but remains significantly weak.   She states that she normally ambulates with a walker but remains too weak and PT OT recommending SNF.  She is medically stable to be discharged to skilled nursing facility once insurance authorization has been obtained.   PT reevaluated and patient did have left leg contracture that likely developed due to deconditioning.  EEG pelvis and hip x-ray was nonacute.  PT OT still recommending SNF.  Overall she is improved and repeat labs in the morning.  Her SNF stay was denied by her insurance company due to lack of updated physical therapy notes that PT evaluated today.  Appeal process has been initiated by the [facility] team this was done last night and  awaiting disposition.  She currently remains medically stable for discharge  Assessment and Plan:  B/L LE weakness and Left LE unilateral pain but could be a pseudo flare of MS from the urinary tract infection: Left hip fracture with concern for fracture -Etiology unclear.   Could be due to UTI. -MRI brain, cervical and thoracic spine unremarkable for evidence of acute MS flare.  No evidence of demyelination. -Continue gabapentin. -B/L LE  venous duplex negative for DVT. -Continue adequate pain control and bowel regimen. -PT and OT recommended skilled nursing facility. -Continue with therapy and DG hip and pelvis x-ray done and showed "There is no evidence of acute fracture. There is mild left hip degenerative change. The left hip is held in flexion." -Will try baclofen as well as aqua thermia for her left hip flexion and possible contracture.  PT OT to further evaluate and treat and recommending SNF and awaiting decision for Insurance Denial Appeal  -Fall precautions.   E. coli UTI POA: -Patient reports burning urination for the last 3 days. -Urine culture grew E. Coli pan-sensitive. -Initiated on ceftriaxone then changed to [facility] and is now improving   Acute kidney injury: > Resolved. -Likely prerenal in the setting of dehydration from poor p.o. intake -Baseline creatinine 0.74, presented with creatinine 1.45. -Improved with IV fluid hydration.  Patient's BUNs/creatinine is now 9/0.77 -> 11/0.68 on last check -Avoid further nephrotoxic medications, contrast dyes, hypotension and dehydration to ensure adequate renal perfusion -Repeat CMP in the a.m if still hospitalized .   Acute urinary retention -Has been getting In-N-Out caths will need to replace Foley catheter and have outpatient urological follow-up; Foley catheter was replaced -Have TOV in the outpatient setting    Hyponatremia -Mild -Patient's sodium went from 137 -> 134 -> 136 -> 138 -Continue to monitor and trend  and repeat CMP in a.m.   Microcytic Anemia -Patient's Hgb/Hct has gone from 12.8/39.1 -> 11.9/37.0 -> 11.3/34.0 -> 11.6/35.8 -> 11.2/34.1 -> 12.1/37.0 with an MCV of 79.2 on last check -Check Anemia Panel in the AM  -Continue to [place] for S/Sx of Bleeding; No overt Bleeding  noted -Repeat CBC in the AM if still hospitalized    Anion gap metabolic acidosis in setting of AKI, improved  -Serum bicarb 17, anion gap 11 initially and is now improved; CO2 is now 25, anion gap is 9, chloride level is 102 on last check -Resolved with IV hydration and now stopped  -Continue to monitor and trend and repeat CMP in the a.m.   Abnormal LFTs -Suspect from alcohol use. -Continue to Monitor for now and AST was 83 -> 65 -> 61 -ALT is now 75 -> 77 -Continue to Monitor Intermittently and mildly elevated -Obtained U/S of the RUQ and showed "Evaluation of gallbladder is limited due to contracted state of the gallbladder. As far as seen, no definite evidence of gallbladder stones. Technologist did not observe tenderness over the gallbladder. Wall thickening in gallbladder may be due to incomplete distention or chronic cholecystitis. Fatty liver.  There is no significant dilation of bile ducts." -Consider Acute Hepatitis Panel but if discharged prior to being obtained it can be done in the outpatient setting    Elevated CPK, Mild Rhabdomyolysis  -CPK 614> 2701>1426 > 682 >440> 349 -> 308 on last check  -Patient denies any trauma or fall. -IV hydration now stopped. Continue to Hold Atorvastatin. -CPK level improving and repeat as an outpatient    Hypophosphatemia: -Phos Level is now 4.0 -Continue to Monitor and Trend -Repeat Phos Level intermittently    Hypomagnesemia: -Mag Level was 2.2 on last check -Repeat Mag Level intermittently    Hypokalemia: -Patient's K+ is now 4.1 -Continue to Monitor and Trend and repeat CMP in the AM    Bilateral Knee Pain / Fibromyalgia: -Could be due to osteoarthritis, fibromyalgia. -Continue Percocet 5 mg every 8 hours as needed.   Hypoalbuminemia -Patient's Albumin Level has gone from 2.7 -> 2.6 -> 2.9 -Continue to Monitor and Trend and repeat Intermittently    Multiple Sclerosis -Continue Q6 month IM ocrevus. -Follow up with  Neurology in the outpatient setting as she had no active Demyelination on MRI's  DVT prophylaxis: enoxaparin (LOVENOX) injection 40 mg Start: 08/04/22 1000    Code Status: Full Code Family Communication: No family present at bedside   Disposition Plan:  Level of care: Telemetry Medical Status is: Inpatient Remains inpatient appropriate because: She is medically stable to be discharged however awaiting insurance denial appeal decision   Consultants:  None  Procedures:  As delineated as above  Antimicrobials:  Anti-infectives (From admission, onward)    Start     Dose/Rate Route Frequency Ordered Stop   08/11/22 0000  cefadroxil (DURICEF) 500 MG capsule        500 mg Oral 2 times daily 08/11/22 1346 08/14/22 2359   08/07/22 1000  cefadroxil (DURICEF) capsule 500 mg        500 mg Oral 2 times daily 08/06/22 1337     08/04/22 0600  cefTRIAXone (ROCEPHIN) 2 g in sodium chloride 0.9 % 100 mL IVPB  Status:  Discontinued        2 g 200 mL/hr over 30 Minutes Intravenous Daily 08/04/22 0453 08/06/22 1337   08/04/22 0430  cefTRIAXone (ROCEPHIN) 1 g in sodium chloride 0.9 % 100 mL IVPB  Status:  Discontinued        1 g 200 mL/hr over 30 Minutes Intravenous  Once 08/04/22 0417 08/04/22 0505       Subjective: Seen and examined at bedside and was frustrated that she is still here.  States that she slept okay but wanted her diet changed earlier.  No nausea or vomiting.  States that she is feeling better and awaiting rehab.  No other concerns or complaints this time.  Objective: Vitals:   08/14/22 0000 08/14/22 0344 08/14/22 0649 08/14/22 0901  BP: (!) 146/87 105/70    Pulse: (!) 58 (!) 57  83  Resp: 18 17    Temp:  (!) 97.4 F (36.3 C)  98.2 F (36.8 C)  TempSrc:  Axillary  Oral  SpO2:  98%  100%  Weight:   48.4 kg   Height:   5\' 3"  (1.6 m)     Intake/Output Summary (Last 24 hours) at 08/14/2022 1242 Last data filed at 08/14/2022 0342 Gross per 24 hour  Intake 240 ml  Output  1000 ml  Net -760 ml   Filed Weights   08/05/22 0250 08/14/22 0649  Weight: 49.8 kg 48.4 kg   Examination: Physical Exam:  Constitutional: Thin chronically ill-appearing African-American female currently no acute distress Respiratory: Diminished to auscultation bilaterally, no wheezing, rales, rhonchi or crackles. Normal respiratory effort and patient is not tachypenic. No accessory muscle use.  Unlabored breathing Cardiovascular: RRR, no murmurs / rubs / gallops. S1 and S2 auscultated. No extremity edema. Abdomen: Soft, non-tender, non-distended. Bowel sounds positive.  GU: Deferred. Musculoskeletal: No clubbing / cyanosis of digits/nails.  Left hip remains flexed Skin: No rashes, lesions, ulcers on limited skin evaluation. No induration; Warm and dry.  Neurologic: CN 2-12 grossly intact with no focal deficits. Romberg sign and cerebellar reflexes not assessed.  Psychiatric: Normal judgment and insight. Alert and oriented x 3.  Slightly frustrated mood and appropriate affect.   Data Reviewed: I have personally reviewed following labs and imaging studies  CBC: Recent Labs  Lab 08/08/22 0257 08/11/22 0428 08/13/22 0839 08/14/22 0321  WBC 4.7 4.7 4.4 4.7  NEUTROABS  --   --  2.3 2.2  HGB 11.3* 11.6* 11.2* 12.1  HCT 34.0* 35.8* 34.1* 37.0  MCV 77.4* 79.0* 79.5* 79.2*  PLT 255 323 292 343   Basic Metabolic Panel: Recent Labs  Lab 08/08/22 0257 08/09/22 0349 08/11/22 0428 08/13/22 0839 08/14/22 0321  NA 137  --  134* 136 138  K 3.4* 3.9 3.8 3.5 4.1  CL 103  --  100 102 103  CO2 24  --  26 25 27   GLUCOSE 108*  --  109* 102* 96  BUN 10  --  15 9 11   CREATININE 0.63  --  0.63 0.77 0.68  CALCIUM 8.8*  --  9.0 9.0 9.4  MG  --   --  2.1 2.0 2.2  PHOS  --   --  4.2 3.7 4.0   GFR: Estimated Creatinine Clearance: 60.7 mL/min (by C-G formula based on SCr of 0.68 mg/dL). Liver Function Tests: Recent Labs  Lab 08/13/22 0839 08/14/22 0321  AST 65* 61*  ALT 75* 77*   ALKPHOS 61 68  BILITOT 0.3 0.2*  PROT 6.2* 6.7  ALBUMIN 2.6* 2.9*   No results for input(s): "LIPASE", "AMYLASE" in the last 168 hours. No results for input(s): "AMMONIA" in the last 168 hours. Coagulation Profile: No results for input(s): "INR", "PROTIME" in the last 168 hours. Cardiac  Enzymes: Recent Labs  Lab 08/08/22 0257 08/09/22 0349 08/13/22 0839  CKTOTAL 440* 349* 308*   BNP (last 3 results) No results for input(s): "PROBNP" in the last 8760 hours. HbA1C: No results for input(s): "HGBA1C" in the last 72 hours. CBG: No results for input(s): "GLUCAP" in the last 168 hours. Lipid Profile: No results for input(s): "CHOL", "HDL", "LDLCALC", "TRIG", "CHOLHDL", "LDLDIRECT" in the last 72 hours. Thyroid Function Tests: No results for input(s): "TSH", "T4TOTAL", "FREET4", "T3FREE", "THYROIDAB" in the last 72 hours. Anemia Panel: No results for input(s): "VITAMINB12", "FOLATE", "FERRITIN", "TIBC", "IRON", "RETICCTPCT" in the last 72 hours. Sepsis Labs: No results for input(s): "PROCALCITON", "LATICACIDVEN" in the last 168 hours.  No results found for this or any previous visit (from the past 240 hour(s)).   Radiology Studies: US Abdomen Limited RUQ (LIVER/GB)  Result Date: 08/13/2022 CLINICAL DATA:  Abnormal liver function tests EXAM: ULTRASOUND ABDOMEN LIMITED RIGHT UPPER QUADRANT COMPARISON:  None Available. FINDINGS: Gallbladder: Evaluation of gallbladder is limited. Gallbladder is contracted. There is wall thickening measuring up to 3.7 mm. Technologist did not observe any tenderness over the gallbladder. There is no fluid around the gallbladder. Common bile duct: Diameter: 6.6 mm. Distal common bile duct is difficult to visualize due to overlying bowel gas. There is no dilation of intrahepatic bile ducts. Liver: There is increased echogenicity in liver suggesting fatty infiltration. No focal abnormalities are seen in visualized portions of liver. Portal vein is patent on  color Doppler imaging with normal direction of blood flow towards the liver. Other: None. IMPRESSION: Evaluation of gallbladder is limited due to contracted state of the gallbladder. As far as seen, no definite evidence of gallbladder stones. Technologist did not observe tenderness over the gallbladder. Wall thickening in gallbladder may be due to incomplete distention or chronic cholecystitis. Fatty liver.  There is no significant dilation of bile ducts. Electronically Signed   By: Ernie Avena M.D.   On: 08/13/2022 19:45   DG HIP UNILAT WITH PELVIS 2-3 VIEWS LEFT  Result Date: 08/12/2022 CLINICAL DATA:  Pain EXAM: DG HIP (WITH OR WITHOUT PELVIS) 2-3V LEFT COMPARISON:  None Available. FINDINGS: There is no evidence of acute fracture. There is mild left hip degenerative change. The left hip is held in flexion. IMPRESSION: No acute osseous abnormality.  Mild left hip osteoarthritis. Left hip is held in flexion. Electronically Signed   By: Caprice Renshaw M.D.   On: 08/12/2022 14:09    Scheduled Meds:  amLODipine  5 mg Oral Daily   cefadroxil  500 mg Oral BID   Chlorhexidine Gluconate Cloth  6 each Topical Daily   enoxaparin (LOVENOX) injection  40 mg Subcutaneous Daily   ferrous sulfate  325 mg Oral BID WC   gabapentin  300 mg Oral TID   hydrALAZINE  50 mg Oral Q8H   lidocaine  1 Application Urethral Once   Continuous Infusions:  methocarbamol (ROBAXIN) IV      LOS: 10 days   Marguerita Merles, DO [facility] Available via Epic secure chat 7am-7pm After these hours, please refer to coverage provider listed on [email or web address] 08/14/2022, 12:42 PM

## 2022-08-15 NOTE — Progress Notes (Signed)
PROGRESS NOTE    Dana Turner  ZOX:[id number] DOB: 03/03/1966 DOA: 08/03/2022 PCP: Georgina Quint, MD   Brief Narrative:  This 56 years old female with PMH significant for fibromyalgia, urinary retention, MS(currently on q6 month IM Ocrevus), lupus, left eye blindness presented in the ED from home with complaints of unilateral left leg pain and bilateral leg weakness to the point where she mostly stayed in her wheelchair for a week.  She denies any fall or trauma.  At baseline she ambulates with a walker. She also reports having burning urination.  ED physician has discussed the case with neurology who recommended MRI brain,  cervical and thoracic spine to rule out MS flare as the cause of her symptoms.  She has developed acute urinary retention requiring in and out cath.  Patient was empirically started on Rocephin for presumptive UTI. MRI's are unremarkable for evidence of acute MS flare.  No evidence of demyelination. She reports doing better but remains significantly weak.   She states that she normally ambulates with a walker but remains too weak and PT OT recommending SNF.  She is medically stable to be discharged to skilled nursing facility once insurance authorization has been obtained.   PT reevaluated and patient did have left leg contracture that likely developed due to deconditioning.  EEG pelvis and hip x-ray was nonacute.  PT OT still recommending SNF.  Overall she is improved and repeat labs in the morning.  Her SNF stay was denied by her insurance company due to lack of updated physical therapy notes that PT evaluated today.  Appeal process has been initiated by the [facility] team this was done last night and  awaiting disposition.  She currently remains medically stable for discharge   Assessment and Plan:  B/L LE weakness and Left LE unilateral pain but could be a pseudo flare of MS from the urinary tract infection: Left hip fracture with concern for contracture -Etiology  unclear.  Could be due to UTI. -MRI brain, cervical and thoracic spine unremarkable for evidence of acute MS flare.  No evidence of demyelination. -Continue gabapentin. -B/L LE  venous duplex negative for DVT. -Continue adequate pain control and bowel regimen. -PT and OT recommended skilled nursing facility. -Continue with therapy and DG hip and pelvis x-ray done and showed "There is no evidence of acute fracture. There is mild left hip degenerative change. The left hip is held in flexion." -Will try baclofen as well as aqua thermia for her left hip flexion and possible contracture.  PT OT to further evaluate and treat and recommending SNF and awaiting decision for Insurance Denial Appeal  -C/w Fall precautions. -Patient states that Left Hip has been flexed for over 3 weeks; will need outpatient monitoring and may need Botox injections and close follow up with Neurology at D/C    E. coli UTI POA: -Patient reports burning urination for the last 3 days. -Urine culture grew E. Coli pan-sensitive. -Initiated on ceftriaxone then changed to [facility] and is now improving   Acute kidney injury: > Resolved. -Likely prerenal in the setting of dehydration from poor p.o. intake -Baseline creatinine 0.74, presented with creatinine 1.45. -Improved with IV fluid hydration.  Patient's BUNs/creatinine is now 9/0.77 -> 11/0.68 on last check -Avoid further nephrotoxic medications, contrast dyes, hypotension and dehydration to ensure adequate renal perfusion -Repeat CMP in the a.m if still hospitalized .   Acute urinary retention -Has been getting In-N-Out caths will need to replace Foley catheter and have outpatient urological  follow-up; Foley catheter was replaced -Have TOV in the outpatient setting    Hyponatremia -Mild -Patient's sodium went from 137 -> 134 -> 136 -> 138 on last check -Continue to monitor and trend and repeat CMP in a.m.   Microcytic Anemia -Patient's Hgb/Hct has gone from 12.8/39.1  -> 11.9/37.0 -> 11.3/34.0 -> 11.6/35.8 -> 11.2/34.1 -> 12.1/37.0 with an MCV of 79.2 on last check -Check Anemia Panel in the AM  -Continue to [place] for S/Sx of Bleeding; No overt Bleeding noted -Repeat CBC in the AM if still hospitalized    Anion gap metabolic acidosis in setting of AKI, improved  -Serum bicarb 17, anion gap 11 initially and is now improved; CO2 is now 25, anion gap is 9, chloride level is 102 on last check -Resolved with IV hydration and now stopped  -Continue to monitor and trend and repeat CMP in the a.m.   Abnormal LFTs -Suspect from alcohol use. -Continue to Monitor for now and AST was 83 -> 65 -> 61 -ALT is now 75 -> 77 -Continue to Monitor Intermittently and mildly elevated -Obtained U/S of the RUQ and showed "Evaluation of gallbladder is limited due to contracted state of the gallbladder. As far as seen, no definite evidence of gallbladder stones. Technologist did not observe tenderness over the gallbladder. Wall thickening in gallbladder may be due to incomplete distention or chronic cholecystitis. Fatty liver.  There is no significant dilation of bile ducts." -Consider Acute Hepatitis Panel but if discharged prior to being obtained it can be done in the outpatient setting    Elevated CPK, Mild Rhabdomyolysis  -CPK 614> 2701>1426 > 682 >440> 349 -> 308 on last check  -Patient denies any trauma or fall. -IV hydration now stopped. Continue to Hold Atorvastatin. -CPK level improving and repeat as an outpatient    Hypophosphatemia: -Phos Level is now 4.0 -Continue to Monitor and Trend -Repeat Phos Level intermittently    Hypomagnesemia: -Mag Level was 2.2 on last check -Repeat Mag Level intermittently    Hypokalemia: -Patient's K+ is now 4.1 -Continue to Monitor and Trend and repeat CMP in the AM    Bilateral Knee Pain / Fibromyalgia: -Could be due to osteoarthritis, fibromyalgia. -Continue Percocet 5 mg every 8 hours as needed.    Hypoalbuminemia -Patient's Albumin Level has gone from 2.7 -> 2.6 -> 2.9 -Continue to Monitor and Trend and repeat Intermittently    Multiple Sclerosis -Continue Q6 month IM ocrevus. -Follow up with Neurology in the outpatient setting as she had no active Demyelination on MRI's -Left Leg held in flexion and concerning for a hip contracture but will need continual mobility and need to see Neurology in the outpatient setting   DVT prophylaxis: enoxaparin (LOVENOX) injection 40 mg Start: 08/04/22 1000    Code Status: Full Code Family Communication: No family present at bedside   Disposition Plan:  Level of care: Telemetry Medical Status is: Inpatient Remains inpatient appropriate because: She is medically stable to be discharged however awaiting insurance denial appeal decision    Consultants:  None  Procedures:  As delineated as above   Antimicrobials:  Anti-infectives (From admission, onward)    Start     Dose/Rate Route Frequency Ordered Stop   08/11/22 0000  cefadroxil (DURICEF) 500 MG capsule        500 mg Oral 2 times daily 08/11/22 1346 08/14/22 2359   08/07/22 1000  cefadroxil (DURICEF) capsule 500 mg        500 mg Oral 2 times daily  08/06/22 1337     08/04/22 0600  cefTRIAXone (ROCEPHIN) 2 g in sodium chloride 0.9 % 100 mL IVPB  Status:  Discontinued        2 g 200 mL/hr over 30 Minutes Intravenous Daily 08/04/22 0453 08/06/22 1337   08/04/22 0430  cefTRIAXone (ROCEPHIN) 1 g in sodium chloride 0.9 % 100 mL IVPB  Status:  Discontinued        1 g 200 mL/hr over 30 Minutes Intravenous  Once 08/04/22 0417 08/04/22 0505       Subjective: Examined at bedside and she is doing fairly well and still awaiting to go to SNF.  Denies any complaints of pain and states that her pain is fairly well-controlled now.  No nausea or vomiting.  No other concerns or complaints this time.  Objective: Vitals:   08/14/22 2012 08/15/22 0010 08/15/22 0433 08/15/22 0808  BP: 115/71 126/70  128/73   Pulse: 77 61 64 (!) 58  Resp: 20 18 16    Temp: 98.2 F (36.8 C) 98.2 F (36.8 C) 98.8 F (37.1 C) 98.1 F (36.7 C)  TempSrc: Axillary Oral Oral Oral  SpO2: 99% 99% 100% 99%  Weight:      Height:        Intake/Output Summary (Last 24 hours) at 08/15/2022 1104 Last data filed at 08/15/2022 0955 Gross per 24 hour  Intake --  Output 1900 ml  Net -1900 ml   Filed Weights   08/05/22 0250 08/14/22 0649  Weight: 49.8 kg 48.4 kg   Examination: Physical Exam:  Constitutional: Thin chronically ill-appearing African-American female currently no acute distress Respiratory: Diminished to auscultation bilaterally, no wheezing, rales, rhonchi or crackles. Normal respiratory effort and patient is not tachypenic. No accessory muscle use.  Unlabored breathing Cardiovascular: RRR, no murmurs / rubs / gallops. S1 and S2 auscultated. No extremity edema.  Abdomen: Soft, non-tender, non-distended. Bowel sounds positive.  GU: Deferred. Musculoskeletal: No clubbing / cyanosis of digits/nails.  Left hip remains flexed Skin: No rashes, lesions, ulcers limited skin evaluation. No induration; Warm and dry.  Neurologic: CN 2-12 grossly intact with no focal deficits. Romberg sign and cerebellar reflexes not assessed.  Psychiatric: Normal judgment and insight. Alert and oriented x 3. Normal mood and appropriate affect.   Data Reviewed: I have personally reviewed following labs and imaging studies  CBC: Recent Labs  Lab 08/11/22 0428 08/13/22 0839 08/14/22 0321  WBC 4.7 4.4 4.7  NEUTROABS  --  2.3 2.2  HGB 11.6* 11.2* 12.1  HCT 35.8* 34.1* 37.0  MCV 79.0* 79.5* 79.2*  PLT 323 292 343   Basic Metabolic Panel: Recent Labs  Lab 08/09/22 0349 08/11/22 0428 08/13/22 0839 08/14/22 0321  NA  --  134* 136 138  K 3.9 3.8 3.5 4.1  CL  --  100 102 103  CO2  --  26 25 27   GLUCOSE  --  109* 102* 96  BUN  --  15 9 11   CREATININE  --  0.63 0.77 0.68  CALCIUM  --  9.0 9.0 9.4  MG  --  2.1  2.0 2.2  PHOS  --  4.2 3.7 4.0   GFR: Estimated Creatinine Clearance: 60.7 mL/min (by C-G formula based on SCr of 0.68 mg/dL). Liver Function Tests: Recent Labs  Lab 08/13/22 0839 08/14/22 0321  AST 65* 61*  ALT 75* 77*  ALKPHOS 61 68  BILITOT 0.3 0.2*  PROT 6.2* 6.7  ALBUMIN 2.6* 2.9*   No results for input(s): "LIPASE", "AMYLASE"  in the last 168 hours. No results for input(s): "AMMONIA" in the last 168 hours. Coagulation Profile: No results for input(s): "INR", "PROTIME" in the last 168 hours. Cardiac Enzymes: Recent Labs  Lab 08/09/22 0349 08/13/22 0839  CKTOTAL 349* 308*   BNP (last 3 results) No results for input(s): "PROBNP" in the last 8760 hours. HbA1C: No results for input(s): "HGBA1C" in the last 72 hours. CBG: No results for input(s): "GLUCAP" in the last 168 hours. Lipid Profile: No results for input(s): "CHOL", "HDL", "LDLCALC", "TRIG", "CHOLHDL", "LDLDIRECT" in the last 72 hours. Thyroid Function Tests: No results for input(s): "TSH", "T4TOTAL", "FREET4", "T3FREE", "THYROIDAB" in the last 72 hours. Anemia Panel: No results for input(s): "VITAMINB12", "FOLATE", "FERRITIN", "TIBC", "IRON", "RETICCTPCT" in the last 72 hours. Sepsis Labs: No results for input(s): "PROCALCITON", "LATICACIDVEN" in the last 168 hours.  No results found for this or any previous visit (from the past 240 hour(s)).   Radiology Studies: US Abdomen Limited RUQ (LIVER/GB)  Result Date: 08/13/2022 CLINICAL DATA:  Abnormal liver function tests EXAM: ULTRASOUND ABDOMEN LIMITED RIGHT UPPER QUADRANT COMPARISON:  None Available. FINDINGS: Gallbladder: Evaluation of gallbladder is limited. Gallbladder is contracted. There is wall thickening measuring up to 3.7 mm. Technologist did not observe any tenderness over the gallbladder. There is no fluid around the gallbladder. Common bile duct: Diameter: 6.6 mm. Distal common bile duct is difficult to visualize due to overlying bowel gas. There is  no dilation of intrahepatic bile ducts. Liver: There is increased echogenicity in liver suggesting fatty infiltration. No focal abnormalities are seen in visualized portions of liver. Portal vein is patent on color Doppler imaging with normal direction of blood flow towards the liver. Other: None. IMPRESSION: Evaluation of gallbladder is limited due to contracted state of the gallbladder. As far as seen, no definite evidence of gallbladder stones. Technologist did not observe tenderness over the gallbladder. Wall thickening in gallbladder may be due to incomplete distention or chronic cholecystitis. Fatty liver.  There is no significant dilation of bile ducts. Electronically Signed   By: Ernie Avena M.D.   On: 08/13/2022 19:45    Scheduled Meds:  amLODipine  5 mg Oral Daily   cefadroxil  500 mg Oral BID   Chlorhexidine Gluconate Cloth  6 each Topical Daily   enoxaparin (LOVENOX) injection  40 mg Subcutaneous Daily   ferrous sulfate  325 mg Oral BID WC   gabapentin  300 mg Oral TID   hydrALAZINE  50 mg Oral Q8H   lidocaine  1 Application Urethral Once   Continuous Infusions:  methocarbamol (ROBAXIN) IV      LOS: 11 days   Marguerita Merles, DO [facility] Available via Epic secure chat 7am-7pm After these hours, please refer to coverage provider listed on [email or web address] 08/15/2022, 11:04 AM

## 2022-08-16 DIAGNOSIS — R7989 Other specified abnormal findings of blood chemistry: Secondary | ICD-10-CM | POA: Insufficient documentation

## 2022-08-16 DIAGNOSIS — E876 Hypokalemia: Secondary | ICD-10-CM | POA: Insufficient documentation

## 2022-08-16 DIAGNOSIS — N390 Urinary tract infection, site not specified: Secondary | ICD-10-CM | POA: Insufficient documentation

## 2022-08-16 DIAGNOSIS — M24552 Contracture, left hip: Secondary | ICD-10-CM | POA: Insufficient documentation

## 2022-08-16 DIAGNOSIS — D509 Iron deficiency anemia, unspecified: Secondary | ICD-10-CM

## 2022-08-16 DIAGNOSIS — E8809 Other disorders of plasma-protein metabolism, not elsewhere classified: Secondary | ICD-10-CM

## 2022-08-16 DIAGNOSIS — R338 Other retention of urine: Secondary | ICD-10-CM | POA: Insufficient documentation

## 2022-08-16 DIAGNOSIS — N179 Acute kidney failure, unspecified: Secondary | ICD-10-CM | POA: Insufficient documentation

## 2022-08-16 DIAGNOSIS — E8729 Other acidosis: Secondary | ICD-10-CM | POA: Insufficient documentation

## 2022-08-16 DIAGNOSIS — E871 Hypo-osmolality and hyponatremia: Secondary | ICD-10-CM

## 2022-08-16 DIAGNOSIS — R748 Abnormal levels of other serum enzymes: Secondary | ICD-10-CM

## 2022-08-16 MED ORDER — MORPHINE SULFATE (PF) 2 MG/ML IV SOLN
2.0000 mg | INTRAVENOUS | Status: DC | PRN
  Administered 2022-08-16 (×2): 2 mg via INTRAVENOUS
  Filled 2022-08-16 (×2): qty 1

## 2022-08-16 MED ORDER — MORPHINE SULFATE (PF) 2 MG/ML IV SOLN
2.0000 mg | Freq: Once | INTRAVENOUS | Status: AC
  Administered 2022-08-16: 2 mg via INTRAVENOUS
  Filled 2022-08-16: qty 1

## 2022-08-16 MED ORDER — GABAPENTIN 300 MG PO CAPS: 300.0000 mg | ORAL_CAPSULE | Freq: Three times a day (TID) | ORAL | 0 refills | Status: AC

## 2022-08-16 NOTE — TOC Transition Note (Signed)
Transition of Care [facility]) - CM/SW Discharge Note   Patient Details  Name: Dana Turner MRN: [id number] Date of Birth: 1965-10-11  Transition of [facility] CM/SW Contact:  Tory Emerald, LCSW Phone Number: 08/16/2022, 12:57 PM   Clinical Narrative:     Pt going to [facility] via [place].  Call to report number: [phone] Room number 114  Final next level of care: Skilled Nursing Facility Barriers to Discharge: No Barriers Identified   Patient Goals and CMS Choice Patient states their goals for this hospitalization and ongoing recovery are:: to get healthy and get back home for the holidays CMS Medicare.gov Compare Post Acute Care list provided to:: Patient Choice offered to / list presented to : Patient, Sibling  Discharge Placement              Patient chooses bed at: [facility] Patient to be transferred to facility by: [facility] Name of family member notified: Steward Drone Patient and family notified of of transfer: 08/16/22  Discharge Plan and Services                                     Social Determinants of Health (SDOH) Interventions     Readmission Risk Interventions     No data to display

## 2022-08-16 NOTE — Discharge Summary (Signed)
Physician Discharge Summary   Patient: Dana Turner MRN: [id number] DOB: Jul 25, 1966  Admit date:     08/03/2022  Discharge date: 08/16/22  Discharge Physician: Raiford Noble, DO   PCP: Horald Pollen, MD   Recommendations at discharge:   Follow up with PCP within 1-2 weeks Follow up with Neurology within 1-2 weeks Follow up with Urology within 1-2 weeks for TOV  Discharge Diagnoses: Principal Problem:   Weakness of both legs Active Problems:   Essential hypertension   Multiple sclerosis (HCC)   Weakness   Flexion contracture of left hip   E. coli UTI (urinary tract infection)   AKI (acute kidney injury) ([place])   Acute urinary retention   Hyponatremia   Microcytic anemia   Increased anion gap metabolic acidosis   Abnormal LFTs   Elevated CPK   Hypophosphatemia   Hypomagnesemia   Hypokalemia   Hypoalbuminemia  Resolved Problems:   * No resolved hospital problems. [facility] Course: This 56 years old female with PMH significant for fibromyalgia, urinary retention, MS(currently on q6 month IM Ocrevus), lupus, left eye blindness presented in the ED from home with complaints of unilateral left leg pain and bilateral leg weakness to the point where she mostly stayed in her wheelchair for a week.  She denies any fall or trauma.  At baseline she ambulates with a walker. She also reports having burning urination.  ED physician has discussed the case with neurology who recommended MRI brain,  cervical and thoracic spine to rule out MS flare as the cause of her symptoms.  She has developed acute urinary retention requiring in and out cath.  Patient was empirically started on Rocephin for presumptive UTI. MRI's are unremarkable for evidence of acute MS flare.  No evidence of demyelination. She reports doing better but remains significantly weak.   She states that she normally ambulates with a walker but remains too weak and PT OT recommending SNF.  She is medically stable to be  discharged to skilled nursing facility once insurance authorization has been obtained.   PT reevaluated and patient did have left leg contracture that likely developed due to deconditioning.  EEG pelvis and hip x-ray was nonacute.  PT OT still recommending SNF.  Overall she is improved and repeat labs in the morning.  Her SNF stay was denied by her insurance company due to lack of updated physical therapy notes that PT evaluated today.  Appeal process has been initiated by the [facility] team this was done last night and  awaiting disposition.  She currently remains medically stable for discharge  Assessment and Plan:  B/L LE weakness and Left LE unilateral pain but could be a pseudo flare of MS from the urinary tract infection: Left hip Flexion with concern for contracture -Etiology unclear.  Could be due to UTI. -MRI brain, cervical and thoracic spine unremarkable for evidence of acute MS flare.  No evidence of demyelination. -Continue gabapentin. -B/L LE  venous duplex negative for DVT. -Continue adequate pain control and bowel regimen. -PT and OT recommended skilled nursing facility. -Continue with therapy and DG hip and pelvis x-ray done and showed "There is no evidence of acute fracture. There is mild left hip degenerative change. The left hip is held in flexion." -Will try baclofen as well as aqua thermia for her left hip flexion and possible contracture.  PT OT to further evaluate and treat and recommending SNF and awaiting decision for Insurance Denial Appeal  -C/w Fall precautions. -Patient states that  Left Hip has been flexed for over 3 weeks; will need outpatient monitoring and may need Botox injections and close follow up with Neurology at D/C    E. coli UTI POA: -Patient reports burning urination for the last 3 days. -Urine culture grew E. Coli pan-sensitive. -Initiated on ceftriaxone then changed to [facility] and is now improving   Acute kidney injury: > Resolved. -Likely prerenal in  the setting of dehydration from poor p.o. intake -Baseline creatinine 0.74, presented with creatinine 1.45. -Improved with IV fluid hydration.  Patient's BUNs/creatinine is now 9/0.77 -> 11/0.68 on last check -Avoid further nephrotoxic medications, contrast dyes, hypotension and dehydration to ensure adequate renal perfusion -Repeat CMP in the a.m if still hospitalized .   Acute urinary retention -Has been getting In-N-Out caths will need to replace Foley catheter and have outpatient urological follow-up; Foley catheter was replaced -Have TOV in the outpatient setting    Hyponatremia -Mild -Patient's sodium went from 137 -> 134 -> 136 -> 138 on last check -Continue to monitor and trend and repeat CMP in a.m.   Microcytic Anemia -Patient's Hgb/Hct has gone from 12.8/39.1 -> 11.9/37.0 -> 11.3/34.0 -> 11.6/35.8 -> 11.2/34.1 -> 12.1/37.0 with an MCV of 79.2 on last check -Check Anemia Panel in the outpatient setting  -Continue to [place] for S/Sx of Bleeding; No overt Bleeding noted -Repeat CBC in the AM if still hospitalized    Anion gap metabolic acidosis in setting of AKI, improved  -Serum bicarb 17, anion gap 11 initially and is now improved; CO2 is now 25, anion gap is 9, chloride level is 102 on last check -Resolved with IV hydration and now stopped  -Continue to monitor and trend and repeat CMP in the a.m.   Abnormal LFTs -Suspect from alcohol use. -Continue to Monitor for now and AST was 83 -> 65 -> 61 -ALT is now 75 -> 77 on last check  -Continue to Monitor Intermittently and mildly elevated -Obtained U/S of the RUQ and showed "Evaluation of gallbladder is limited due to contracted state of the gallbladder. As far as seen, no definite evidence of gallbladder stones. Technologist did not observe tenderness over the gallbladder. Wall thickening in gallbladder may be due to incomplete distention or chronic cholecystitis. Fatty liver.  There is no significant dilation of bile  ducts." -Consider Acute Hepatitis Panel but if discharged prior to being obtained it can be done in the outpatient setting    Elevated CPK, Mild Rhabdomyolysis  -CPK 614> 2701>1426 > 682 >440> 349 -> 308 on last check  -Patient denies any trauma or fall. -IV hydration now stopped. Continue to Hold Atorvastatin. -CPK level improving and repeat as an outpatient    Hypophosphatemia: -Phos Level is now 4.0 on last check -Continue to Monitor and Trend -Repeat Phos Level intermittently    Hypomagnesemia: -Mag Level was 2.2 on last check -Repeat Mag Level intermittently    Hypokalemia: -Patient's K+ is now 4.1 -Continue to Monitor and Trend and repeat CMP in the AM    Bilateral Knee Pain / Fibromyalgia: -Could be due to osteoarthritis, fibromyalgia. -Continue Percocet 5 mg every 8 hours as needed.   Hypoalbuminemia -Patient's Albumin Level has gone from 2.7 -> 2.6 -> 2.9 -Continue to Monitor and Trend and repeat Intermittently    Multiple Sclerosis -Continue Q6 month IM ocrevus. -Follow up with Neurology in the outpatient setting as she had no active Demyelination on MRI's -Left Leg held in flexion and concerning for a hip contracture but will need  continual mobility and need to see Neurology in the outpatient setting  Consultants: None Procedures performed: LE Venous Duplex    Disposition: Skilled nursing facility Diet recommendation:  Discharge Diet Orders (From admission, onward)     Start     Ordered   08/11/22 0000  Diet - low sodium heart healthy        08/11/22 1346           Regular diet DISCHARGE MEDICATION: Allergies as of 08/16/2022   No Known Allergies      Medication List     STOP taking these medications    dalfampridine 10 MG Tb12 Commonly known as: Ampyra       TAKE these medications    acetaminophen 325 MG tablet Commonly known as: TYLENOL Take 2 tablets (650 mg total) by mouth every 6 (six) hours as needed for mild pain, fever or  headache. What changed:  medication strength how much to take when to take this reasons to take this   amLODipine 5 MG tablet Commonly known as: NORVASC Take 1 tablet (5 mg total) by mouth daily. What changed:  medication strength how much to take   atorvastatin 20 MG tablet Commonly known as: LIPITOR Take 1 tablet (20 mg total) by mouth at bedtime. Start taking on: August 18, 2022 What changed: These instructions start on August 18, 2022. If you are unsure what to do until then, ask your doctor or other care provider.   FeroSul 325 (65 FE) MG tablet Generic drug: ferrous sulfate Take 325 mg by mouth daily with breakfast.   gabapentin 300 MG capsule Commonly known as: NEURONTIN Take 1 capsule (300 mg total) by mouth 3 (three) times daily.   hydrALAZINE 25 MG tablet Commonly known as: APRESOLINE Take 25 mg by mouth 2 (two) times daily.   lisinopril 40 MG tablet Commonly known as: ZESTRIL Take 40 mg by mouth in the morning. What changed: Another medication with the same name was removed. Continue taking this medication, and follow the directions you see here.   oxyCODONE-acetaminophen 5-325 MG tablet Commonly known as: PERCOCET/ROXICET Take 1 tablet by mouth every 8 (eight) hours as needed for severe pain.   polyethylene glycol 17 g packet Commonly known as: MIRALAX / GLYCOLAX Take 17 g by mouth daily as needed for mild constipation.   sertraline 25 MG tablet Commonly known as: ZOLOFT Take 25 mg by mouth daily in the afternoon.   VITAMIN D-3 PO Take 1 capsule by mouth daily in the afternoon.       ASK your doctor about these medications    cefadroxil 500 MG capsule Commonly known as: DURICEF Take 1 capsule (500 mg total) by mouth 2 (two) times daily for 3 days. Ask about: Should I take this medication?       Discharge Exam: Filed Weights   08/05/22 0250 08/14/22 0649  Weight: 49.8 kg 48.4 kg   Vitals:   08/16/22 0721 08/16/22 1140  BP: (!)  170/100 (!) 147/81  Pulse: 72 66  Resp:    Temp: 97.9 F (36.6 C) 97.7 F (36.5 C)  SpO2: 100% 100%   Examination: Physical Exam:  Constitutional: Chronically ill-appearing African-American female currently no acute distress Respiratory: Diminished to auscultation bilaterally, no wheezing, rales, rhonchi or crackles. Normal respiratory effort and patient is not tachypenic. No accessory muscle use.  Unlabored breathing Cardiovascular: RRR, no murmurs / rubs / gallops. S1 and S2 auscultated. No extremity edema Abdomen: Soft, non-tender, non-distended. Bowel sounds positive.  GU: Deferred. Musculoskeletal: No clubbing / cyanosis of digits/nails.  Left hip from a flexed Skin: No rashes, lesions, ulcers on the skin evaluation. No induration; Warm and dry.  Neurologic: CN 2-12 grossly intact with no focal deficits.Romberg sign cerebellar reflexes not assessed.  Psychiatric: Normal judgment and insight. Alert and oriented x 3. Normal mood and appropriate affect.   Condition at discharge: stable  The results of significant diagnostics from this hospitalization (including imaging, microbiology, ancillary and laboratory) are listed below for reference.   Imaging Studies: US Abdomen Limited RUQ (LIVER/GB)  Result Date: 08/13/2022 CLINICAL DATA:  Abnormal liver function tests EXAM: ULTRASOUND ABDOMEN LIMITED RIGHT UPPER QUADRANT COMPARISON:  None Available. FINDINGS: Gallbladder: Evaluation of gallbladder is limited. Gallbladder is contracted. There is wall thickening measuring up to 3.7 mm. Technologist did not observe any tenderness over the gallbladder. There is no fluid around the gallbladder. Common bile duct: Diameter: 6.6 mm. Distal common bile duct is difficult to visualize due to overlying bowel gas. There is no dilation of intrahepatic bile ducts. Liver: There is increased echogenicity in liver suggesting fatty infiltration. No focal abnormalities are seen in visualized portions of liver.  Portal vein is patent on color Doppler imaging with normal direction of blood flow towards the liver. Other: None. IMPRESSION: Evaluation of gallbladder is limited due to contracted state of the gallbladder. As far as seen, no definite evidence of gallbladder stones. Technologist did not observe tenderness over the gallbladder. Wall thickening in gallbladder may be due to incomplete distention or chronic cholecystitis. Fatty liver.  There is no significant dilation of bile ducts. Electronically Signed   By: Elmer Picker M.D.   On: 08/13/2022 19:45   DG HIP UNILAT WITH PELVIS 2-3 VIEWS LEFT  Result Date: 08/12/2022 CLINICAL DATA:  Pain EXAM: DG HIP (WITH OR WITHOUT PELVIS) 2-3V LEFT COMPARISON:  None Available. FINDINGS: There is no evidence of acute fracture. There is mild left hip degenerative change. The left hip is held in flexion. IMPRESSION: No acute osseous abnormality.  Mild left hip osteoarthritis. Left hip is held in flexion. Electronically Signed   By: Maurine Simmering M.D.   On: 08/12/2022 14:09   DG Tibia/Fibula Right  Result Date: 08/09/2022 CLINICAL DATA:  Right lower leg pain EXAM: RIGHT TIBIA AND FIBULA - 2 VIEW COMPARISON:  None Available. FINDINGS: Postsurgical changes are noted. No acute fracture or dislocation is noted. No soft tissue abnormality is seen. IMPRESSION: No acute abnormality noted. Electronically Signed   By: Inez Catalina M.D.   On: 08/09/2022 21:20   DG Tibia/Fibula Left  Result Date: 08/09/2022 CLINICAL DATA:  Left lower leg pain, initial encounter EXAM: LEFT TIBIA AND FIBULA - 2 VIEW COMPARISON:  None Available. FINDINGS: There is no evidence of fracture or other focal bone lesions. Soft tissues are unremarkable. IMPRESSION: No acute abnormality noted. Electronically Signed   By: Inez Catalina M.D.   On: 08/09/2022 21:20   VAS [place] LOWER EXTREMITY VENOUS (DVT)  Result Date: 08/04/2022  Lower Venous DVT Study Patient Name:  IRELYNNE WIITA  Date of Exam:    08/04/2022 Medical Rec #: [id number]       Accession #:    [id number] Date of Birth: [birth year]       Patient Gender: F Patient Age:   72 years Exam Location:  [facility] Procedure:      VAS [place] LOWER EXTREMITY VENOUS (DVT) Referring Phys: Archie Patten HALL --------------------------------------------------------------------------------  Indications: Bilateral lower extremity weakness, left leg pain.  Risk  Factors: Immobility. Limitations: Patient positioning- significant pain with attempts to readjust. Comparison Study: No prior studies. Performing Technologist: Darlin Coco RDMS, RVT  Examination Guidelines: A complete evaluation includes B-mode imaging, spectral Doppler, color Doppler, and power Doppler as needed of all accessible portions of each vessel. Bilateral testing is considered an integral part of a complete examination. Limited examinations for reoccurring indications may be performed as noted. The reflux portion of the exam is performed with the patient in reverse Trendelenburg.  +---------+---------------+---------+-----------+----------+---------------+ RIGHT    CompressibilityPhasicitySpontaneityPropertiesThrombus Aging  +---------+---------------+---------+-----------+----------+---------------+ CFV      Full           Yes      Yes                                  +---------+---------------+---------+-----------+----------+---------------+ SFJ      Full                                                         +---------+---------------+---------+-----------+----------+---------------+ FV Prox  Full                                                         +---------+---------------+---------+-----------+----------+---------------+ FV Mid   Full                                                         +---------+---------------+---------+-----------+----------+---------------+ FV DistalFull                                                          +---------+---------------+---------+-----------+----------+---------------+ PFV      Full                                                         +---------+---------------+---------+-----------+----------+---------------+ POP      Full           Yes      Yes                                  +---------+---------------+---------+-----------+----------+---------------+ PTV                     Yes      Yes                  Patent by color +---------+---------------+---------+-----------+----------+---------------+ PERO                    Yes      Yes  Patent by color +---------+---------------+---------+-----------+----------+---------------+   +---------+---------------+---------+-----------+----------+--------------+ LEFT     CompressibilityPhasicitySpontaneityPropertiesThrombus Aging +---------+---------------+---------+-----------+----------+--------------+ CFV      Full           Yes      Yes                                 +---------+---------------+---------+-----------+----------+--------------+ SFJ      Full                                                        +---------+---------------+---------+-----------+----------+--------------+ FV Prox  Full                                                        +---------+---------------+---------+-----------+----------+--------------+ FV Mid   Full                                                        +---------+---------------+---------+-----------+----------+--------------+ FV DistalFull                                                        +---------+---------------+---------+-----------+----------+--------------+ PFV      Full                                                        +---------+---------------+---------+-----------+----------+--------------+ POP      Full           Yes      Yes                                  +---------+---------------+---------+-----------+----------+--------------+ PTV      Full                                                        +---------+---------------+---------+-----------+----------+--------------+ PERO     Full                                                        +---------+---------------+---------+-----------+----------+--------------+     Summary: RIGHT: - There is no evidence of deep vein thrombosis in the lower extremity. However, portions of this examination were limited- see technologist comments above.  - No cystic structure found in the popliteal fossa.  LEFT: - There is no evidence of deep vein thrombosis in the lower extremity.  - No cystic structure found in the popliteal fossa.  *See table(s) above for measurements and observations. Electronically signed by Coral Else MD on 08/04/2022 at 8:53:49 PM.    Final    MR THORACIC SPINE W WO CONTRAST  Result Date: 08/04/2022 CLINICAL DATA:  Initial evaluation for demyelinating disease. EXAM: MRI THORACIC WITHOUT AND WITH CONTRAST TECHNIQUE: Multiplanar and multiecho pulse sequences of the thoracic spine were obtained without and with intravenous contrast. CONTRAST:  74mL GADAVIST GADOBUTROL 1 MMOL/ML IV SOLN COMPARISON:  Comparison made with prior MRI from 03/11/2021. FINDINGS: Alignment:  Examination degraded by motion artifact. Vertebral bodies normally aligned with preservation of the normal thoracic kyphosis. No listhesis. Vertebrae: Vertebral body height maintained without acute or chronic fracture. Bone marrow signal intensity within normal limits. No worrisome osseous lesions. No abnormal marrow edema or enhancement. Cord: Diffuse patchy signal abnormality again seen throughout the thoracic spinal cord, consistent with history of demyelinating disease. Involvement is fairly extensive with signal changes seen throughout the majority of the cord. In comparison with prior study, overall appearance is likely not  significantly changed or progressed. No definite new lesions. No abnormal enhancement to suggest active demyelination. Paraspinal and other soft tissues: No acute finding. 2.1 cm simple left renal cyst, benign in appearance, no follow-up imaging recommended. Disc levels: Minimal for age disc degeneration within the midthoracic spine. No significant facet disease. No significant canal or foraminal stenosis. IMPRESSION: Diffuse patchy signal abnormality throughout the thoracic spinal cord, consistent with history of chronic demyelinating disease. Overall, appearance is not significantly changed or progressed as compared to prior study from 03/11/2021. No evidence for active demyelination. Electronically Signed   By: Rise Mu M.D.   On: 08/04/2022 02:13   MR Cervical Spine W or Wo Contrast  Result Date: 08/04/2022 CLINICAL DATA:  Initial evaluation for demyelinating disease. EXAM: MRI CERVICAL SPINE WITHOUT AND WITH CONTRAST TECHNIQUE: Multiplanar and multiecho pulse sequences of the cervical spine, to include the craniocervical junction and cervicothoracic junction, were obtained without and with intravenous contrast. CONTRAST:  28mL GADAVIST GADOBUTROL 1 MMOL/ML IV SOLN COMPARISON:  Prior MRI from 05/20/2021. FINDINGS: Alignment: Examination is degraded by motion artifact, most notably at the postcontrast sagittal sequence. Straightening with reversal of the normal cervical lordosis. No listhesis. Vertebrae: Vertebral body height maintained without acute or chronic fracture. Bone marrow signal intensity within normal limits. No discrete or worrisome osseous lesions. Mild discogenic reactive endplate changes present about the C4-5 and C5-6 interspaces. No other abnormal marrow edema. Cord: Again seen is extensive patchy signal abnormality throughout the cervical spinal cord, consistent with history of demyelinating disease/multiple sclerosis. Changes extend from the cervicomedullary junction through the  visualized upper thoracic spinal cord, with involvement of essentially all levels. In comparison with previous exam, overall appearance is likely non significantly changed or progressed. No definite new lesions. No visible abnormal enhancement to suggest active demyelination. Posterior Fossa, vertebral arteries, paraspinal tissues: Unremarkable. Disc levels: C2-C3: Negative interspace. Mild left-sided facet hypertrophy. No canal or foraminal stenosis. C3-C4: Mild disc bulge with right-sided uncovertebral and facet hypertrophy. No significant spinal stenosis. Foramina remain adequately patent. C4-C5: Right eccentric disc bulge. Associated right greater than left uncovertebral spurring. Flattening of the ventral thecal sac with no more than mild spinal stenosis. Moderate right C5 foraminal narrowing. Appearance is similar to prior. C5-C6: Small central disc protrusion indents the ventral thecal sac. No significant spinal stenosis or frank cord  impingement. Mild uncovertebral spurring without significant foraminal stenosis. Appearance is stable. C6-C7: Small central disc protrusion mildly indents the ventral thecal sac. No significant spinal stenosis or cord impingement. Right-sided uncovertebral spurring without significant foraminal encroachment. Appearance is relatively similar. C7-T1:  Unremarkable. IMPRESSION: 1. Extensive patchy signal abnormality throughout the cervical spinal cord, consistent with history of chronic demyelinating disease/multiple sclerosis. Overall, appearance is not significantly changed or progressed as compared to previous MRI from 05/20/2021. No definite new lesions or evidence for active demyelination. 2. Mild multilevel cervical spondylosis, most pronounced at C4-5 where there is resultant mild spinal stenosis, with moderate right C5 foraminal narrowing. Electronically Signed   By: Rise Mu M.D.   On: 08/04/2022 02:04   MR Brain W and Wo Contrast  Result Date:  08/04/2022 CLINICAL DATA:  Initial evaluation for demyelinating disease. EXAM: MRI HEAD WITHOUT AND WITH CONTRAST TECHNIQUE: Multiplanar, multiecho pulse sequences of the brain and surrounding structures were obtained without and with intravenous contrast. CONTRAST:  38mL GADAVIST GADOBUTROL 1 MMOL/ML IV SOLN COMPARISON:  Prior MRI from 05/20/2020. FINDINGS: Brain: Examination mildly degraded by motion artifact. Advanced cerebral atrophy for age. Again seen is extensive white matter changes consistent with history of demyelinating disease. There is involvement of both the supratentorial and infratentorial white matter, with involvement of the brainstem and cerebellum. Multiple corresponding T1 black holes. In comparison with previous exam, overall extent and pattern is not significantly changed or progressed. No associated enhancement or restricted diffusion to suggest active demyelination. No evidence for acute or subacute infarct. No mass lesion, midline shift or mass effect. No hydrocephalus or extra-axial fluid collection. No evidence for acute intracranial hemorrhage. Single punctate chronic microhemorrhage noted within the right thalamus. Pituitary gland and suprasellar region within normal limits. Vascular: Major intracranial vascular flow voids are maintained. Skull and upper cervical spine: Craniocervical junction within normal limits. Bone marrow signal intensity normal. No scalp soft tissue abnormality. Sinuses/Orbits: Globes orbital soft tissues demonstrate no acute finding. Mild scattered mucosal thickening noted about the ethmoidal air cells and maxillary sinuses. No mastoid effusion. Other: None. IMPRESSION: 1. Extensive cerebral white matter disease, consistent with history of chronic demyelinating disease/multiple sclerosis. Overall, appearance is not significantly changed or progressed as compared to most recent MRI from 05/20/2021. No evidence for active demyelination. 2. Underlying advanced  cerebral atrophy for age. 3. No other acute intracranial abnormality. Electronically Signed   By: Rise Mu M.D.   On: 08/04/2022 01:55   DG Knee 1-2 Views Right  Result Date: 08/03/2022 CLINICAL DATA:  Leg pain EXAM: RIGHT KNEE - 1-2 VIEW COMPARISON:  None Available. FINDINGS: Postoperative changes. No acute fracture, subluxation or dislocation. No joint effusion. Soft tissues are unremarkable. IMPRESSION: No acute bony abnormality. Electronically Signed   By: Charlett Nose M.D.   On: 08/03/2022 20:03   DG Knee 1-2 Views Left  Result Date: 08/03/2022 CLINICAL DATA:  Leg pain EXAM: LEFT KNEE - 1-2 VIEW COMPARISON:  None Available. FINDINGS: Limited study due to patient condition and positioning. No visible fracture, subluxation or dislocation. No joint effusion. IMPRESSION: No acute bony abnormality. Electronically Signed   By: Charlett Nose M.D.   On: 08/03/2022 20:02    Microbiology: Results for orders placed or performed during the hospital encounter of 08/03/22  Urine Culture     Status: Abnormal   Collection Time: 08/04/22  4:53 AM   Specimen: Urine, Clean Catch  Result Value Ref Range Status   Specimen Description URINE, CLEAN CATCH  Final  Special Requests   Final    NONE Performed at [facility], [place] [address], [place], [place] [address]    Culture >=100,000 COLONIES/mL ESCHERICHIA COLI (A)  Final   Report Status 08/06/2022 FINAL  Final   Organism ID, Bacteria ESCHERICHIA COLI (A)  Final      Susceptibility   Escherichia coli - MIC*    AMPICILLIN <=2 SENSITIVE Sensitive     CEFAZOLIN <=4 SENSITIVE Sensitive     CEFEPIME <=0.12 SENSITIVE Sensitive     CEFTRIAXONE <=0.25 SENSITIVE Sensitive     CIPROFLOXACIN <=0.25 SENSITIVE Sensitive     GENTAMICIN <=1 SENSITIVE Sensitive     IMIPENEM <=0.25 SENSITIVE Sensitive     NITROFURANTOIN <=16 SENSITIVE Sensitive     TRIMETH/SULFA <=20 SENSITIVE Sensitive     AMPICILLIN/SULBACTAM <=2 SENSITIVE Sensitive      PIP/TAZO <=4 SENSITIVE Sensitive     * >=100,000 COLONIES/mL ESCHERICHIA COLI   Labs: CBC: Recent Labs  Lab 08/11/22 0428 08/13/22 0839 08/14/22 0321  WBC 4.7 4.4 4.7  NEUTROABS  --  2.3 2.2  HGB 11.6* 11.2* 12.1  HCT 35.8* 34.1* 37.0  MCV 79.0* 79.5* 79.2*  PLT 323 292 [id number]   Basic Metabolic Panel: Recent Labs  Lab 08/11/22 0428 08/13/22 0839 08/14/22 0321  NA 134* 136 138  K 3.8 3.5 4.1  CL 100 102 103  CO2 26 25 27   GLUCOSE 109* 102* 96  BUN 15 9 11   CREATININE 0.63 0.77 0.68  CALCIUM 9.0 9.0 9.4  MG 2.1 2.0 2.2  PHOS 4.2 3.7 4.0   Liver Function Tests: Recent Labs  Lab 08/13/22 0839 08/14/22 0321  AST 65* 61*  ALT 75* 77*  ALKPHOS 61 68  BILITOT 0.3 0.2*  PROT 6.2* 6.7  ALBUMIN 2.6* 2.9*   CBG: No results for input(s): "GLUCAP" in the last 168 hours.  Discharge time spent: greater than 30 minutes.  Signed: Raiford Noble, DO [facility] 08/16/2022

## 2022-08-16 NOTE — Progress Notes (Signed)
Pt informed RN of 5/10 chest discomfort after eating quickly eating a burger. RN informed Dr. Posey Rea and ordered a EKG. Vital signs completed and WNL. After patient drank some water, patient stated the pain went away. EKG completed and was normal Dr. Posey Rea reviewed EKG at bedside and assessed patient. Per MD, okay for patient to leave for SNF and patient expressed her desire to leave as well and states she feels fine.

## 2022-10-07 ENCOUNTER — Other Ambulatory Visit: Payer: Self-pay | Admitting: Pharmacy Technician

## 2022-10-28 ENCOUNTER — Telehealth: Payer: Self-pay | Admitting: Pharmacy Technician

## 2022-10-28 NOTE — Telephone Encounter (Addendum)
PA RENEWAL  Auth Submission: APPROVED Payer: [employer] MEDICARE Medication & CPT/J Code(s) submitted: Dana Turner) [phone] Route of submission (phone, fax, portal):  Phone # [phone] Fax # [phone] Auth type: Buy/Bill Units/visits requested: X1 DOSE Reference number: [id number] Approval from: 04/24/21 to 08/30/23

## 2022-11-07 ENCOUNTER — Encounter: Payer: Self-pay | Admitting: Neurology

## 2022-11-08 ENCOUNTER — Telehealth: Payer: Self-pay

## 2022-11-08 NOTE — Transitions of Care (Post Inpatient/ED Visit) (Unsigned)
   11/08/2022  Name: Dana Turner MRN: [id number] DOB: 1965-12-13  Today's TOC FU Call Status: Today's TOC FU Call Status:: Unsuccessul Call (1st Attempt) Unsuccessful Call (1st Attempt) Date: 11/08/22  Attempted to reach the patient regarding the most recent Inpatient/ED visit.  Follow Up Plan: Additional outreach attempts will be made to reach the patient to complete the Transitions of Care (Post Inpatient/ED visit) call.   Anderson LPN [place] Advisor Direct Dial [phone]

## 2022-11-09 NOTE — Transitions of Care (Post Inpatient/ED Visit) (Unsigned)
   11/09/2022  Name: Dana Turner MRN: [id number] DOB: 1966/01/12  Today's TOC FU Call Status: Today's TOC FU Call Status:: Unsuccessful Call (2nd Attempt) Unsuccessful Call (1st Attempt) Date: 11/08/22 Unsuccessful Call (2nd Attempt) Date: 11/09/22  Attempted to reach the patient regarding the most recent Inpatient/ED visit.  Follow Up Plan: Additional outreach attempts will be made to reach the patient to complete the Transitions of Care (Post Inpatient/ED visit) call.   Keyes LPN [place] Advisor Direct Dial [phone]

## 2022-11-10 NOTE — Transitions of Care (Post Inpatient/ED Visit) (Signed)
   11/10/2022  Name: Dana Turner MRN: [id number] DOB: 08/05/1966  Today's TOC FU Call Status: Today's TOC FU Call Status:: Unsuccessful Call (3rd Attempt) Unsuccessful Call (1st Attempt) Date: 11/08/22 Unsuccessful Call (2nd Attempt) Date: 11/09/22 Unsuccessful Call (3rd Attempt) Date: 11/10/22  Attempted to reach the patient regarding the most recent Inpatient/ED visit.  Follow Up Plan: No further outreach attempts will be made at this time. We have been unable to contact the patient.  Reydon LPN [place] Advisor Direct Dial [phone]

## 2022-11-30 ENCOUNTER — Encounter: Payer: Self-pay | Admitting: Neurology

## 2022-12-07 ENCOUNTER — Ambulatory Visit: Payer: Medicare HMO

## 2023-01-19 NOTE — Progress Notes (Deleted)
NEUROLOGY FOLLOW UP OFFICE NOTE  Dana Turner [id number]  Assessment/Plan:   Multiple sclerosis Pseudoexacerbation in setting of UTI   1  DMT:  Ocrevus 2  Increase gabapentin to 300mg  three times daily 3  Start Ampyra 10mg  every 12 hours.  Discussed side effects such as lower seizure-threshold  4  D3 4000 IU daily 5  Check quantitative immunoglobulin panel today 6  Check quantitative immunoglobulin panel and vit D level in 6 months 7  Repeat MRI of brain/cervical/thoracic with and without contrast in 6 months 8  Follow up in 6 months (after repeat tests)   Subjective:  Dana Turner is a 57 year old rig-handed female with lupus, fibromyalgia, HTN, legally blind in left eye, and depression/anxiety who follows up for multiple sclerosis. History supplemented by recent hospital admission notes. She is accompanied by her sister.   UPDATE: Current DMT:  none Other medications:  gabapentin 100mg  TID, D3 4000 IU daily, Ampyra   Last seen in November 2022.  ***  In December 2023, she developed left leg pain, bilateral leg weakness and urinary retention.  She had to remain in her wheelchair.  She also developed burning on urination.  She was admitted to ***.  MRI of brain, cervical and thoracic spine with and without contrast was negative for acute demyelination.  Bilateral lower extremity venous Doppler negative for DVT.  X-ray of pelvis and left hip revealed mild degenerative changes in the left hip. Urine culture was positive for E. Coli.  She was treated for UTI.  CKs ***   Vision:  legally blind in left eye Motor:  *** Sensory:  *** Pain:  *** Gait:  *** Bowel/Bladder:  *** Fatigue:  *** Cognition:  short term memory problems. Mood:  ***   HISTORY: She was diagnosed with lupus several years ago while living in [place].  She presented with various symptoms such as numbness, pain and balance problems.  She reportedly had an MRI performed there, but does not remember the  results.  She is on Plaquenil.  Symptoms improved and she had been doing well up until 2021.  She started having recurrent falls.  She attributed it to right knee pain with instability.  She has prior history of MCL reconstruction.  She also has been experiencing low back pain with radicular pain down the anterior right leg.  This progressed to weakness in the left leg as well.  She has generalized pain.  She saw orthopedics.  MRI of right knee on 01/09/2021 showed mild degeneration of the ACL and prior MCL reconstruction with intact graft but no internal derangement or other significant pathology.  MRI of lumbar spine performed same day showed mild multilevel lumbar spondylosis with mild left neuroforaminal stenosis at L3-4 and L4-5 but otherwise unremarkable. She then had MRI of the brain without contrast on 02/06/2021 which was was concerning for MS.  MRI of cervical and thoracic spine with and without contrast on 03/12/2021 showed widespread demyelinating disease.  She was started on Ocrevus in September 2022.  She was also advised to start D3 supplementation..  For generalized pain, she was started on gabapentin.  Reports continued pain uncontrolled.  Following her first Ocrevus infusion, she developed increased weakness and temperature of 101.1.  She was found to be positive for COVID-19.  She was hospitalized on 05/20/2021 where MRI of brain and cervical spine showed no new lesions.     Denies family history of multiple sclerosis.  Her sister has other autoimmune diseases  as well.  Imaging: 08/04/2022 MRI BRAIN W WO:  1. Extensive cerebral white matter disease, consistent with history of chronic demyelinating disease/multiple sclerosis. Overall, appearance is not significantly changed or progressed as compared to most recent MRI from 05/20/2021. No evidence for active demyelination.  2. Underlying advanced cerebral atrophy for age. 3. No other acute intracranial abnormality. 08/04/2022 MRI C-SPINE W WO:   1. Extensive patchy signal abnormality throughout the cervical spinal cord, consistent with history of chronic demyelinating disease/multiple sclerosis. Overall, appearance is not significantly changed or progressed as compared to previous MRI from 05/20/2021.  No definite new lesions or evidence for active demyelination. 2. Mild multilevel cervical spondylosis, most pronounced at C4-5 where there is resultant mild spinal stenosis, with moderate right C5 foraminal narrowing. 08/04/2022 MRI T-SPINE W WO:  Diffuse patchy signal abnormality throughout the thoracic spinal cord, consistent with history of chronic demyelinating disease. Overall, appearance is not significantly changed or progressed as compared to prior study from 03/11/2021. No evidence for active demyelination. 05/20/2021 MRI BRAIN W WO:  No change appreciable since the study of March 26, 2021. Extensive widespread demyelinating changes throughout the brain as outlined above. No evidence of new or progressive lesion. No lesion shows 05/20/2021 MRI C-SPINE W WO:  Heterogeneous T2 and FLAIR signal affecting the craniocervical junction and cervical spinal cord, unchanged since the study of July. No new demyelinating lesion. No abnormal contrast enhancement.  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 restricted diffusion or contrast enhancement. 03/26/2021 MRI BRAIN W WO:  Advanced white matter changes. Pattern consistent with multiple sclerosis. No active demyelinization identified. 03/11/2021 MRI C & T-SPINE W WO:  Widespread nonenhancing cord plaques in the cervical and thoracic spine. 02/06/2021 MRI BRAIN WO:  Advanced white matter disease involving cerebrum, brainstem, and cerebellum with parenchymal volume loss. Appearance is suspicious for primary demyelinating disease such as multiple sclerosis. There also may be a component of chronic microvascular ischemic change in the  setting of reported lupus.  PAST MEDICAL HISTORY: Past Medical History:  Diagnosis Date   Anxiety    Depression    Hypertension    Legally blind in left eye, as defined in [place]    Lupus [facility])     MEDICATIONS: Current Outpatient Medications on File Prior to Visit  Medication Sig Dispense Refill   acetaminophen (TYLENOL) 325 MG tablet Take 2 tablets (650 mg total) by mouth every 6 (six) hours as needed for mild pain, fever or headache.     amLODipine (NORVASC) 5 MG tablet Take 1 tablet (5 mg total) by mouth daily.     atorvastatin (LIPITOR) 20 MG tablet Take 1 tablet (20 mg total) by mouth at bedtime.     Cholecalciferol (VITAMIN D-3 PO) Take 1 capsule by mouth daily in the afternoon.     FEROSUL 325 (65 Fe) MG tablet Take 325 mg by mouth daily with breakfast.     gabapentin (NEURONTIN) 300 MG capsule Take 1 capsule (300 mg total) by mouth 3 (three) times daily. 30 capsule 0   hydrALAZINE (APRESOLINE) 25 MG tablet Take 25 mg by mouth 2 (two) times daily.     lisinopril (ZESTRIL) 40 MG tablet Take 40 mg by mouth in the morning.     oxyCODONE-acetaminophen (PERCOCET/ROXICET) 5-325 MG tablet Take 1 tablet by mouth every 8 (eight) hours as needed for severe pain. 6 tablet 0   polyethylene glycol (MIRALAX / GLYCOLAX) 17 g packet Take 17 g by  mouth daily as needed for mild constipation. 14 each 0   sertraline (ZOLOFT) 25 MG tablet Take 25 mg by mouth daily in the afternoon.     No current facility-administered medications on file prior to visit.    ALLERGIES: No Known Allergies  FAMILY HISTORY: No family history on file.    Objective:  *** General: No acute distress.  Patient appears well-groomed.   Head:  Normocephalic/atraumatic Eyes:  Fundi examined but not visualized Neck: supple, no paraspinal tenderness, full range of motion Heart:  Regular rate and rhythm Lungs:  Clear to auscultation bilaterally Back: No paraspinal tenderness Neurological Exam: alert and oriented.  Speech  fluent and not dysarthric, language intact.  Vision loss in left eye.  Left eye abducted on primary gaze.  Reduced tracking of both eyes to right.  Otherwise, CN II-XII intact.  Generalized atrophy.  Increased tone ***.  Muscle strength 4/5 bilateral triceps, 5-/5 bilateral hand grip, 4/5 right hip flexion, 2/5 left hip flexion, 4/5 bilateral knee extension/flexion, bilateral foot drop.  Otherwise, 5/5 throughout.  Sensation to ***.  Deep tendon reflexes 3+ throughout.  Bilateral Babinski.  Finger to nose testing mildly ataxic.  ***.  Timed 25 foot walk ***   Shon Millet, DO  CC: Georgina Quint, MD

## 2023-01-21 ENCOUNTER — Ambulatory Visit: Payer: Medicare HMO | Admitting: Neurology

## 2023-01-21 ENCOUNTER — Encounter: Payer: Self-pay | Admitting: Neurology
